# Patient Record
Sex: Female | Born: 1975 | Race: Black or African American | Hispanic: No | Marital: Single | Smoking: Current every day smoker
Health system: Southern US, Community
[De-identification: ages and names within clinical notes are randomized; demographics above are authoritative.]

## PROBLEM LIST (undated history)

## (undated) DIAGNOSIS — G039 Meningitis, unspecified: Secondary | ICD-10-CM

## (undated) DIAGNOSIS — I10 Essential (primary) hypertension: Secondary | ICD-10-CM

## (undated) DIAGNOSIS — M797 Fibromyalgia: Secondary | ICD-10-CM

## (undated) DIAGNOSIS — M1990 Unspecified osteoarthritis, unspecified site: Secondary | ICD-10-CM

## (undated) DIAGNOSIS — F329 Major depressive disorder, single episode, unspecified: Secondary | ICD-10-CM

## (undated) DIAGNOSIS — F32A Depression, unspecified: Secondary | ICD-10-CM

## (undated) HISTORY — PX: HAND SURGERY: SHX662

## (undated) HISTORY — PX: FOOT SURGERY: SHX648

## (undated) HISTORY — PX: FOOT FRACTURE SURGERY: SHX645

---

## 1997-07-08 ENCOUNTER — Emergency Department (HOSPITAL_COMMUNITY): Admission: EM | Admit: 1997-07-08 | Discharge: 1997-07-08 | Payer: Self-pay | Admitting: Emergency Medicine

## 1997-08-17 ENCOUNTER — Other Ambulatory Visit: Admission: RE | Admit: 1997-08-17 | Discharge: 1997-08-17 | Payer: Self-pay | Admitting: Obstetrics and Gynecology

## 1997-08-17 ENCOUNTER — Other Ambulatory Visit: Admission: RE | Admit: 1997-08-17 | Discharge: 1997-08-17 | Payer: Self-pay | Admitting: Gynecology

## 1997-10-29 ENCOUNTER — Ambulatory Visit (HOSPITAL_COMMUNITY): Admission: RE | Admit: 1997-10-29 | Discharge: 1997-10-29 | Payer: Self-pay | Admitting: Gynecology

## 1998-03-21 ENCOUNTER — Encounter: Payer: Self-pay | Admitting: Emergency Medicine

## 1998-03-21 ENCOUNTER — Emergency Department (HOSPITAL_COMMUNITY): Admission: EM | Admit: 1998-03-21 | Discharge: 1998-03-21 | Payer: Self-pay | Admitting: Emergency Medicine

## 1998-03-24 ENCOUNTER — Ambulatory Visit: Admission: RE | Admit: 1998-03-24 | Discharge: 1998-03-24 | Payer: Self-pay | Admitting: Family Medicine

## 1998-03-24 ENCOUNTER — Encounter: Payer: Self-pay | Admitting: Internal Medicine

## 1998-12-26 ENCOUNTER — Inpatient Hospital Stay (HOSPITAL_COMMUNITY): Admission: EM | Admit: 1998-12-26 | Discharge: 1998-12-26 | Payer: Self-pay | Admitting: Obstetrics & Gynecology

## 1999-07-28 ENCOUNTER — Inpatient Hospital Stay (HOSPITAL_COMMUNITY): Admission: AD | Admit: 1999-07-28 | Discharge: 1999-07-28 | Payer: Self-pay | Admitting: *Deleted

## 1999-07-31 ENCOUNTER — Emergency Department (HOSPITAL_COMMUNITY): Admission: EM | Admit: 1999-07-31 | Discharge: 1999-08-01 | Payer: Self-pay | Admitting: Emergency Medicine

## 1999-08-01 ENCOUNTER — Inpatient Hospital Stay (HOSPITAL_COMMUNITY): Admission: EM | Admit: 1999-08-01 | Discharge: 1999-08-01 | Payer: Self-pay | Admitting: *Deleted

## 1999-08-01 ENCOUNTER — Encounter: Payer: Self-pay | Admitting: Emergency Medicine

## 1999-08-28 ENCOUNTER — Emergency Department (HOSPITAL_COMMUNITY): Admission: EM | Admit: 1999-08-28 | Discharge: 1999-08-28 | Payer: Self-pay | Admitting: *Deleted

## 1999-08-30 ENCOUNTER — Emergency Department (HOSPITAL_COMMUNITY): Admission: EM | Admit: 1999-08-30 | Discharge: 1999-08-30 | Payer: Self-pay | Admitting: Emergency Medicine

## 1999-09-06 ENCOUNTER — Emergency Department (HOSPITAL_COMMUNITY): Admission: EM | Admit: 1999-09-06 | Discharge: 1999-09-06 | Payer: Self-pay | Admitting: Emergency Medicine

## 1999-09-26 ENCOUNTER — Emergency Department (HOSPITAL_COMMUNITY): Admission: EM | Admit: 1999-09-26 | Discharge: 1999-09-26 | Payer: Self-pay | Admitting: *Deleted

## 1999-11-12 ENCOUNTER — Emergency Department (HOSPITAL_COMMUNITY): Admission: EM | Admit: 1999-11-12 | Discharge: 1999-11-12 | Payer: Self-pay | Admitting: Emergency Medicine

## 1999-11-12 ENCOUNTER — Encounter: Payer: Self-pay | Admitting: Emergency Medicine

## 2000-01-03 ENCOUNTER — Inpatient Hospital Stay (HOSPITAL_COMMUNITY): Admission: AD | Admit: 2000-01-03 | Discharge: 2000-01-03 | Payer: Self-pay | Admitting: Obstetrics

## 2000-01-07 ENCOUNTER — Emergency Department (HOSPITAL_COMMUNITY): Admission: EM | Admit: 2000-01-07 | Discharge: 2000-01-07 | Payer: Self-pay | Admitting: Emergency Medicine

## 2000-01-09 ENCOUNTER — Other Ambulatory Visit: Admission: RE | Admit: 2000-01-09 | Discharge: 2000-01-09 | Payer: Self-pay | Admitting: Obstetrics & Gynecology

## 2000-01-09 ENCOUNTER — Encounter: Admission: RE | Admit: 2000-01-09 | Discharge: 2000-01-09 | Payer: Self-pay | Admitting: Obstetrics & Gynecology

## 2000-01-16 ENCOUNTER — Ambulatory Visit (HOSPITAL_COMMUNITY): Admission: RE | Admit: 2000-01-16 | Discharge: 2000-01-16 | Payer: Self-pay | Admitting: Obstetrics & Gynecology

## 2000-06-01 ENCOUNTER — Inpatient Hospital Stay (HOSPITAL_COMMUNITY): Admission: AD | Admit: 2000-06-01 | Discharge: 2000-06-01 | Payer: Self-pay | Admitting: Obstetrics

## 2000-11-17 ENCOUNTER — Inpatient Hospital Stay (HOSPITAL_COMMUNITY): Admission: AD | Admit: 2000-11-17 | Discharge: 2000-11-17 | Payer: Self-pay | Admitting: Obstetrics

## 2001-01-02 ENCOUNTER — Encounter: Admission: RE | Admit: 2001-01-02 | Discharge: 2001-01-02 | Payer: Self-pay | Admitting: Obstetrics

## 2001-01-02 ENCOUNTER — Encounter (INDEPENDENT_AMBULATORY_CARE_PROVIDER_SITE_OTHER): Payer: Self-pay | Admitting: *Deleted

## 2001-04-27 ENCOUNTER — Emergency Department (HOSPITAL_COMMUNITY): Admission: EM | Admit: 2001-04-27 | Discharge: 2001-04-27 | Payer: Self-pay | Admitting: Emergency Medicine

## 2002-12-08 ENCOUNTER — Emergency Department (HOSPITAL_COMMUNITY): Admission: EM | Admit: 2002-12-08 | Discharge: 2002-12-08 | Payer: Self-pay | Admitting: *Deleted

## 2002-12-09 ENCOUNTER — Ambulatory Visit (HOSPITAL_COMMUNITY): Admission: RE | Admit: 2002-12-09 | Discharge: 2002-12-09 | Payer: Self-pay | Admitting: *Deleted

## 2002-12-09 ENCOUNTER — Encounter: Payer: Self-pay | Admitting: *Deleted

## 2003-11-27 ENCOUNTER — Emergency Department (HOSPITAL_COMMUNITY): Admission: EM | Admit: 2003-11-27 | Discharge: 2003-11-27 | Payer: Self-pay | Admitting: Emergency Medicine

## 2003-12-13 ENCOUNTER — Ambulatory Visit: Payer: Self-pay | Admitting: *Deleted

## 2003-12-13 ENCOUNTER — Ambulatory Visit: Payer: Self-pay | Admitting: Family Medicine

## 2004-03-13 ENCOUNTER — Ambulatory Visit: Payer: Self-pay | Admitting: Family Medicine

## 2004-03-14 ENCOUNTER — Ambulatory Visit: Payer: Self-pay | Admitting: Family Medicine

## 2004-05-09 ENCOUNTER — Ambulatory Visit: Payer: Self-pay | Admitting: Family Medicine

## 2004-06-10 ENCOUNTER — Inpatient Hospital Stay (HOSPITAL_COMMUNITY): Admission: AD | Admit: 2004-06-10 | Discharge: 2004-06-10 | Payer: Self-pay | Admitting: *Deleted

## 2004-11-02 ENCOUNTER — Ambulatory Visit: Payer: Self-pay | Admitting: Family Medicine

## 2004-12-01 ENCOUNTER — Ambulatory Visit: Payer: Self-pay | Admitting: Family Medicine

## 2005-03-07 ENCOUNTER — Ambulatory Visit: Payer: Self-pay | Admitting: Family Medicine

## 2005-03-07 ENCOUNTER — Emergency Department (HOSPITAL_COMMUNITY): Admission: EM | Admit: 2005-03-07 | Discharge: 2005-03-07 | Payer: Self-pay | Admitting: Emergency Medicine

## 2005-11-05 ENCOUNTER — Emergency Department (HOSPITAL_COMMUNITY): Admission: EM | Admit: 2005-11-05 | Discharge: 2005-11-05 | Payer: Self-pay | Admitting: Family Medicine

## 2005-11-26 ENCOUNTER — Ambulatory Visit: Payer: Self-pay | Admitting: Family Medicine

## 2006-02-12 ENCOUNTER — Emergency Department (HOSPITAL_COMMUNITY): Admission: EM | Admit: 2006-02-12 | Discharge: 2006-02-12 | Payer: Self-pay | Admitting: Family Medicine

## 2006-07-28 ENCOUNTER — Emergency Department (HOSPITAL_COMMUNITY): Admission: EM | Admit: 2006-07-28 | Discharge: 2006-07-28 | Payer: Self-pay | Admitting: Family Medicine

## 2006-08-07 ENCOUNTER — Ambulatory Visit: Payer: Self-pay | Admitting: Internal Medicine

## 2006-08-08 ENCOUNTER — Ambulatory Visit: Payer: Self-pay | Admitting: Internal Medicine

## 2006-10-03 ENCOUNTER — Ambulatory Visit: Payer: Self-pay | Admitting: Internal Medicine

## 2006-11-20 ENCOUNTER — Encounter (INDEPENDENT_AMBULATORY_CARE_PROVIDER_SITE_OTHER): Payer: Self-pay | Admitting: *Deleted

## 2006-12-23 ENCOUNTER — Ambulatory Visit: Payer: Self-pay | Admitting: Internal Medicine

## 2006-12-23 ENCOUNTER — Encounter (INDEPENDENT_AMBULATORY_CARE_PROVIDER_SITE_OTHER): Payer: Self-pay | Admitting: Family Medicine

## 2006-12-23 LAB — CONVERTED CEMR LAB
ALT: 27 units/L (ref 0–35)
AST: 26 units/L (ref 0–37)
Albumin: 4.2 g/dL (ref 3.5–5.2)
Alkaline Phosphatase: 62 units/L (ref 39–117)
BUN: 16 mg/dL (ref 6–23)
CO2: 25 meq/L (ref 19–32)
Calcium: 9.2 mg/dL (ref 8.4–10.5)
Chloride: 104 meq/L (ref 96–112)
Cholesterol: 167 mg/dL (ref 0–200)
Creatinine, Ser: 0.9 mg/dL (ref 0.40–1.20)
Glucose, Bld: 73 mg/dL (ref 70–99)
HDL: 41 mg/dL (ref >39)
LDL Cholesterol: 96 mg/dL (ref 0–99)
Potassium: 4.3 meq/L (ref 3.5–5.3)
Sodium: 140 meq/L (ref 135–145)
Total Bilirubin: 0.5 mg/dL (ref 0.3–1.2)
Total CHOL/HDL Ratio: 4.1
Total Protein: 7.7 g/dL (ref 6.0–8.3)
Triglycerides: 149 mg/dL (ref <150)
VLDL: 30 mg/dL (ref 0–40)
Valproic Acid Lvl: 45.1 ug/mL — ABNORMAL LOW (ref 50.0–100.0)

## 2007-01-07 ENCOUNTER — Ambulatory Visit: Payer: Self-pay | Admitting: Internal Medicine

## 2007-01-07 ENCOUNTER — Encounter (INDEPENDENT_AMBULATORY_CARE_PROVIDER_SITE_OTHER): Payer: Self-pay | Admitting: Family Medicine

## 2007-01-19 ENCOUNTER — Emergency Department (HOSPITAL_COMMUNITY): Admission: EM | Admit: 2007-01-19 | Discharge: 2007-01-19 | Payer: Self-pay | Admitting: Emergency Medicine

## 2007-06-01 ENCOUNTER — Emergency Department (HOSPITAL_COMMUNITY): Admission: EM | Admit: 2007-06-01 | Discharge: 2007-06-01 | Payer: Self-pay | Admitting: *Deleted

## 2007-07-08 ENCOUNTER — Ambulatory Visit: Payer: Self-pay | Admitting: Internal Medicine

## 2007-08-12 ENCOUNTER — Ambulatory Visit: Payer: Self-pay | Admitting: Family Medicine

## 2007-08-12 ENCOUNTER — Encounter (INDEPENDENT_AMBULATORY_CARE_PROVIDER_SITE_OTHER): Payer: Self-pay | Admitting: Family Medicine

## 2007-08-12 LAB — CONVERTED CEMR LAB
Basophils Absolute: 0 10*3/uL (ref 0.0–0.1)
CO2: 21 meq/L (ref 19–32)
Chlamydia, DNA Probe: NEGATIVE
Chloride: 109 meq/L (ref 96–112)
Creatinine, Ser: 0.84 mg/dL (ref 0.40–1.20)
Free T4: 0.96 ng/dL (ref 0.89–1.80)
GC Probe Amp, Genital: NEGATIVE
Glucose, Bld: 83 mg/dL (ref 70–99)
HCT: 41 % (ref 36.0–46.0)
Hemoglobin: 12.9 g/dL (ref 12.0–15.0)
Lymphocytes Relative: 24 % (ref 12–46)
Lymphs Abs: 1.6 10*3/uL (ref 0.7–4.0)
Monocytes Absolute: 0.5 10*3/uL (ref 0.1–1.0)
Neutro Abs: 4.3 10*3/uL (ref 1.7–7.7)
RDW: 15.3 % (ref 11.5–15.5)
Sodium: 139 meq/L (ref 135–145)
TSH: 0.729 microintl units/mL (ref 0.350–5.50)
WBC: 6.6 10*3/uL (ref 4.0–10.5)

## 2008-05-20 ENCOUNTER — Emergency Department (HOSPITAL_COMMUNITY): Admission: EM | Admit: 2008-05-20 | Discharge: 2008-05-20 | Payer: Self-pay | Admitting: Family Medicine

## 2008-08-22 ENCOUNTER — Emergency Department (HOSPITAL_COMMUNITY): Admission: EM | Admit: 2008-08-22 | Discharge: 2008-08-22 | Payer: Self-pay | Admitting: Emergency Medicine

## 2008-11-23 ENCOUNTER — Emergency Department (HOSPITAL_COMMUNITY): Admission: EM | Admit: 2008-11-23 | Discharge: 2008-11-23 | Payer: Self-pay | Admitting: Emergency Medicine

## 2008-12-20 ENCOUNTER — Emergency Department (HOSPITAL_COMMUNITY): Admission: EM | Admit: 2008-12-20 | Discharge: 2008-12-20 | Payer: Self-pay | Admitting: Emergency Medicine

## 2009-03-22 ENCOUNTER — Ambulatory Visit: Payer: Self-pay | Admitting: Internal Medicine

## 2009-04-06 ENCOUNTER — Encounter: Admission: RE | Admit: 2009-04-06 | Discharge: 2009-04-06 | Payer: Self-pay | Admitting: General Practice

## 2009-05-13 ENCOUNTER — Emergency Department (HOSPITAL_COMMUNITY): Admission: EM | Admit: 2009-05-13 | Discharge: 2009-05-13 | Payer: Self-pay | Admitting: Emergency Medicine

## 2009-08-26 ENCOUNTER — Emergency Department (HOSPITAL_COMMUNITY): Admission: EM | Admit: 2009-08-26 | Discharge: 2009-08-26 | Payer: Self-pay | Admitting: Family Medicine

## 2009-08-29 ENCOUNTER — Emergency Department (HOSPITAL_COMMUNITY): Admission: EM | Admit: 2009-08-29 | Discharge: 2009-08-29 | Payer: Self-pay | Admitting: Emergency Medicine

## 2009-11-18 ENCOUNTER — Ambulatory Visit (HOSPITAL_COMMUNITY): Payer: Self-pay | Admitting: Psychiatry

## 2009-11-25 ENCOUNTER — Emergency Department (HOSPITAL_COMMUNITY): Admission: EM | Admit: 2009-11-25 | Discharge: 2009-11-25 | Payer: Self-pay | Admitting: Emergency Medicine

## 2010-01-10 ENCOUNTER — Ambulatory Visit (HOSPITAL_COMMUNITY): Payer: Self-pay | Admitting: Psychiatry

## 2010-01-17 ENCOUNTER — Ambulatory Visit (HOSPITAL_COMMUNITY): Payer: Self-pay | Admitting: Marriage and Family Therapist

## 2010-02-03 ENCOUNTER — Ambulatory Visit (HOSPITAL_COMMUNITY): Payer: Self-pay | Admitting: Psychiatry

## 2010-02-17 ENCOUNTER — Ambulatory Visit (HOSPITAL_COMMUNITY): Payer: Self-pay | Admitting: Psychiatry

## 2010-02-17 ENCOUNTER — Ambulatory Visit (HOSPITAL_COMMUNITY): Payer: Self-pay | Admitting: Psychology

## 2010-03-03 ENCOUNTER — Ambulatory Visit (HOSPITAL_COMMUNITY): Payer: Self-pay | Admitting: Psychology

## 2010-03-07 ENCOUNTER — Ambulatory Visit (HOSPITAL_COMMUNITY)
Admission: RE | Admit: 2010-03-07 | Discharge: 2010-03-07 | Payer: Self-pay | Source: Home / Self Care | Attending: Psychiatry | Admitting: Psychiatry

## 2010-03-10 ENCOUNTER — Ambulatory Visit (HOSPITAL_COMMUNITY)
Admission: RE | Admit: 2010-03-10 | Discharge: 2010-03-10 | Payer: Self-pay | Source: Home / Self Care | Attending: Psychiatry | Admitting: Psychiatry

## 2010-03-10 ENCOUNTER — Ambulatory Visit (HOSPITAL_COMMUNITY): Admit: 2010-03-10 | Payer: Self-pay | Admitting: Psychiatry

## 2010-03-31 ENCOUNTER — Ambulatory Visit (HOSPITAL_COMMUNITY): Admit: 2010-03-31 | Payer: Self-pay | Admitting: Psychiatry

## 2010-04-11 ENCOUNTER — Encounter (HOSPITAL_COMMUNITY): Payer: 59 | Admitting: Physician Assistant

## 2010-04-11 DIAGNOSIS — F39 Unspecified mood [affective] disorder: Secondary | ICD-10-CM

## 2010-04-14 ENCOUNTER — Encounter (HOSPITAL_COMMUNITY): Payer: Self-pay | Admitting: Psychology

## 2010-05-12 ENCOUNTER — Encounter (HOSPITAL_COMMUNITY): Payer: 59 | Admitting: Physician Assistant

## 2010-05-18 LAB — URINALYSIS, ROUTINE W REFLEX MICROSCOPIC
Bilirubin Urine: NEGATIVE
Hgb urine dipstick: NEGATIVE
Ketones, ur: 15 mg/dL — AB
Protein, ur: NEGATIVE mg/dL
Specific Gravity, Urine: 1.025 (ref 1.005–1.030)
pH: 5.5 (ref 5.0–8.0)

## 2010-05-18 LAB — URINE MICROSCOPIC-ADD ON

## 2010-05-21 LAB — CULTURE, ROUTINE-ABSCESS: Gram Stain: NONE SEEN

## 2010-06-08 LAB — POCT I-STAT, CHEM 8
Calcium, Ion: 1.2 mmol/L (ref 1.12–1.32)
Creatinine, Ser: 0.9 mg/dL (ref 0.4–1.2)
Glucose, Bld: 109 mg/dL — ABNORMAL HIGH (ref 70–99)
HCT: 48 % — ABNORMAL HIGH (ref 36.0–46.0)
Hemoglobin: 16.3 g/dL — ABNORMAL HIGH (ref 12.0–15.0)

## 2010-06-12 LAB — POCT RAPID STREP A (OFFICE): Streptococcus, Group A Screen (Direct): NEGATIVE

## 2010-07-06 ENCOUNTER — Inpatient Hospital Stay (INDEPENDENT_AMBULATORY_CARE_PROVIDER_SITE_OTHER)
Admission: RE | Admit: 2010-07-06 | Discharge: 2010-07-06 | Disposition: A | Discharge status: Home / Self Care | Payer: 59 | Source: Ambulatory Visit | Attending: Family Medicine | Admitting: Family Medicine

## 2010-07-06 ENCOUNTER — Ambulatory Visit (INDEPENDENT_AMBULATORY_CARE_PROVIDER_SITE_OTHER): Payer: 59

## 2010-07-06 DIAGNOSIS — M79609 Pain in unspecified limb: Secondary | ICD-10-CM

## 2010-09-13 ENCOUNTER — Inpatient Hospital Stay (INDEPENDENT_AMBULATORY_CARE_PROVIDER_SITE_OTHER)
Admission: RE | Admit: 2010-09-13 | Discharge: 2010-09-13 | Disposition: A | Discharge status: Home / Self Care | Payer: 59 | Source: Ambulatory Visit | Attending: Family Medicine | Admitting: Family Medicine

## 2010-09-13 DIAGNOSIS — J019 Acute sinusitis, unspecified: Secondary | ICD-10-CM

## 2010-09-13 LAB — POCT RAPID STREP A: Streptococcus, Group A Screen (Direct): NEGATIVE

## 2010-09-13 LAB — POCT I-STAT, CHEM 8
HCT: 46 % (ref 36.0–46.0)
Hemoglobin: 15.6 g/dL — ABNORMAL HIGH (ref 12.0–15.0)
Sodium: 139 mEq/L (ref 135–145)
TCO2: 26 mmol/L (ref 0–100)

## 2011-01-24 ENCOUNTER — Encounter: Payer: Self-pay | Admitting: *Deleted

## 2011-01-24 ENCOUNTER — Emergency Department (INDEPENDENT_AMBULATORY_CARE_PROVIDER_SITE_OTHER)
Admission: EM | Admit: 2011-01-24 | Discharge: 2011-01-24 | Disposition: A | Discharge status: Home / Self Care | Payer: 59 | Source: Home / Self Care | Attending: Emergency Medicine | Admitting: Emergency Medicine

## 2011-01-24 DIAGNOSIS — R197 Diarrhea, unspecified: Secondary | ICD-10-CM

## 2011-01-24 DIAGNOSIS — Z331 Pregnant state, incidental: Secondary | ICD-10-CM

## 2011-01-24 DIAGNOSIS — J069 Acute upper respiratory infection, unspecified: Secondary | ICD-10-CM

## 2011-01-24 HISTORY — DX: Essential (primary) hypertension: I10

## 2011-01-24 LAB — POCT URINALYSIS DIP (DEVICE)
Bilirubin Urine: NEGATIVE
Glucose, UA: NEGATIVE mg/dL
Leukocytes, UA: NEGATIVE
Nitrite: NEGATIVE
pH: 6 (ref 5.0–8.0)

## 2011-01-24 MED ORDER — ONDANSETRON 4 MG PO TBDP
4.0000 mg | ORAL_TABLET | Freq: Once | ORAL | Status: AC
  Administered 2011-01-24: 4 mg via ORAL

## 2011-01-24 MED ORDER — ONDANSETRON 4 MG PO TBDP
ORAL_TABLET | ORAL | Status: AC
  Filled 2011-01-24: qty 1

## 2011-01-24 MED ORDER — ONDANSETRON HCL 4 MG PO TABS: 4.0000 mg | ORAL_TABLET | Freq: Three times a day (TID) | ORAL | Status: AC | PRN

## 2011-01-24 MED ORDER — PRENATAL COMPLETE 14-0.4 MG PO TABS: ORAL_TABLET | ORAL | Status: DC

## 2011-01-24 MED ORDER — DIPHENOXYLATE-ATROPINE 2.5-0.025 MG PO TABS: 1.0000 | ORAL_TABLET | Freq: Four times a day (QID) | ORAL | Status: DC | PRN

## 2011-01-24 NOTE — ED Provider Notes (Addendum)
History     CSN: [id number] Arrival date & time: 01/24/2011  8:07 PM   First MD Initiated Contact with Patient 01/24/11 1928      Chief Complaint  Patient presents with  . Nausea    (Consider location/radiation/quality/duration/timing/severity/associated sxs/prior treatment) HPI Comments: For 2 days been congested and coughing with some diarrheas, been nauseous as well No fevers, have felt some chills and have had some phlegm when i cough".  The history is provided by the patient.    Past Medical History  Diagnosis Date  . Diabetes mellitus   . Hypertension     Past Surgical History  Procedure Date  . Hand surgery   . Foot fracture surgery     Family History  Problem Relation Age of Onset  . Hypertension Father   . Diabetes Father     History  Substance Use Topics  . Smoking status: Current Everyday Smoker  . Smokeless tobacco: Not on file  . Alcohol Use: No    OB History    Grav Para Term Preterm Abortions TAB SAB Ect Mult Living                  Review of Systems  Constitutional: Negative for fever and fatigue.  HENT: Positive for sore throat.   Respiratory: Positive for cough.   Gastrointestinal: Positive for nausea and diarrhea.  Genitourinary: Negative for dyspareunia.       Amenorrhea    Allergies  Review of patient's allergies indicates no known allergies.  Home Medications   Current Outpatient Rx  Name Route Sig Dispense Refill  . BISMUTH SUBSALICYLATE 262 MG/15ML PO SUSP Oral Take 15 mLs by mouth every 6 (six) hours as needed.      Marland Kitchen HYDROCHLOROTHIAZIDE 12.5 MG PO TABS Oral Take 12.5 mg by mouth daily.        BP 130/90  Pulse 82  Temp(Src) 98.6 F (37 C) (Oral)  Resp 18  SpO2 100%  LMP 12/15/2010  Physical Exam  Nursing note and vitals reviewed. Constitutional: She appears well-developed and well-nourished. No distress.  HENT:  Head: Normocephalic.  Mouth/Throat: Uvula is midline and oropharynx is clear and moist. No  oropharyngeal exudate or posterior oropharyngeal erythema.  Eyes: No scleral icterus.  Neck: Normal range of motion.  Abdominal: Soft. She exhibits no mass. There is no tenderness. There is no rebound and no guarding.  Neurological: She is alert.    ED Course  Procedures (including critical care time)   Labs Reviewed  POCT URINALYSIS DIPSTICK  POCT PREGNANCY, URINE   No results found.   No diagnosis found.    MDM  Diarrhea x 48 hrs hydrated tolerating fluids at [facility]- nausea- and early pregnancy based on LMP       Jimmie Molly, MD 01/24/11 2111  Jimmie Molly, MD 01/24/11 2117

## 2011-01-24 NOTE — ED Notes (Signed)
Pt   Has  Symptoms  Of    Cough  /  Congested     And  Nausea        As well       Has  Been taking  mucionex      As  Well

## 2011-01-24 NOTE — ED Notes (Signed)
Nausea      Diarrhea    sorethroat      Coughing  Up  Brown phlegm         Symptoms  X  2  Days         Feels  Weak  And  Having  Chills  As  Well

## 2011-02-08 LAB — OB RESULTS CONSOLE ABO/RH

## 2011-02-08 LAB — OB RESULTS CONSOLE ANTIBODY SCREEN: Antibody Screen: NEGATIVE

## 2011-02-08 LAB — OB RESULTS CONSOLE HEPATITIS B SURFACE ANTIGEN: Hepatitis B Surface Ag: NEGATIVE

## 2011-02-08 LAB — OB RESULTS CONSOLE GC/CHLAMYDIA: Gonorrhea: NEGATIVE

## 2011-02-20 ENCOUNTER — Emergency Department (INDEPENDENT_AMBULATORY_CARE_PROVIDER_SITE_OTHER)
Admission: EM | Admit: 2011-02-20 | Discharge: 2011-02-20 | Disposition: A | Discharge status: Home / Self Care | Payer: 59 | Source: Home / Self Care | Attending: Emergency Medicine | Admitting: Emergency Medicine

## 2011-02-20 ENCOUNTER — Encounter (HOSPITAL_COMMUNITY): Payer: Self-pay | Admitting: *Deleted

## 2011-02-20 DIAGNOSIS — B9789 Other viral agents as the cause of diseases classified elsewhere: Secondary | ICD-10-CM

## 2011-02-20 MED ORDER — ONDANSETRON HCL 4 MG PO TABS: 4.0000 mg | ORAL_TABLET | Freq: Four times a day (QID) | ORAL | Status: AC

## 2011-02-20 MED ORDER — ALBUTEROL SULFATE HFA 108 (90 BASE) MCG/ACT IN AERS: 2.0000 | INHALATION_SPRAY | RESPIRATORY_TRACT | Status: DC | PRN

## 2011-02-20 NOTE — ED Notes (Signed)
Pt     Reports     Cough  Congested  Runny  Nose     sorethroat /  Body  Aches   Symptoms    X  5  Days  unreleived  By  otc  meds  Pt  Is  4  Months  Pregnant   As  Well

## 2011-02-20 NOTE — ED Provider Notes (Signed)
History     CSN: [id number] Arrival date & time: 02/20/2011  3:18 PM   First MD Initiated Contact with Patient 02/20/11 1513      Chief Complaint  Patient presents with  . Cough    (Consider location/radiation/quality/duration/timing/severity/associated sxs/prior treatment) HPI Comments: Patient who is [redacted] weeks pregnant presents with 3 days of cough, sore throat, nasal congestion, headache, ear pain, N/V, fever to 103 at home.  Fever has been relieved with tylenol.  Patient called her obgyn who requested she come to urgent care for further evaluation.  Pt denies SOB, abdominal pain, abnormal vaginal discharge or bleeding.    Patient is a 35 y.o. female presenting with cough. The history is provided by the patient.  Cough    Past Medical History  Diagnosis Date  . Diabetes mellitus   . Hypertension     Past Surgical History  Procedure Date  . Hand surgery   . Foot fracture surgery     Family History  Problem Relation Age of Onset  . Hypertension Father   . Diabetes Father     History  Substance Use Topics  . Smoking status: Current Everyday Smoker  . Smokeless tobacco: Not on file  . Alcohol Use: No    OB History    Grav Para Term Preterm Abortions TAB SAB Ect Mult Living   1               Review of Systems  Respiratory: Positive for cough.   All other systems reviewed and are negative.    Allergies  Review of patient's allergies indicates no known allergies.  Home Medications   Current Outpatient Rx  Name Route Sig Dispense Refill  . ACETAMINOPHEN 325 MG PO TABS Oral Take 650 mg by mouth every 6 (six) hours as needed.      . GUAIFENESIN ER 600 MG PO TB12 Oral Take 1,200 mg by mouth 2 (two) times daily.      Marland Kitchen BISMUTH SUBSALICYLATE 262 MG/15ML PO SUSP Oral Take 15 mLs by mouth every 6 (six) hours as needed.      Marland Kitchen HYDROCHLOROTHIAZIDE 12.5 MG PO TABS Oral Take 12.5 mg by mouth daily.      Marland Kitchen PRENATAL COMPLETE 14-0.4 MG PO TABS  1 tab po daily 60 each  0    BP 128/83  Pulse 88  Temp(Src) 98.5 F (36.9 C) (Oral)  Resp 16  SpO2 100%  LMP 12/15/2010  Physical Exam  Nursing note and vitals reviewed. Constitutional: She is oriented to person, place, and time. She appears well-developed and well-nourished.  HENT:  Head: Normocephalic and atraumatic.  Right Ear: No tenderness. A foreign body is present.  Left Ear: Tympanic membrane normal. No tenderness.  Mouth/Throat: Uvula is midline and mucous membranes are normal. No uvula swelling. Posterior oropharyngeal erythema present. No oropharyngeal exudate, posterior oropharyngeal edema or tonsillar abscesses.  Neck: Trachea normal, normal range of motion and phonation normal. Neck supple. No tracheal tenderness present. No rigidity. No tracheal deviation and normal range of motion present.  Cardiovascular: Normal rate and regular rhythm.   Pulmonary/Chest: Effort normal and breath sounds normal. No stridor. She has no decreased breath sounds. She has no wheezes. She has no rhonchi. She has no rales.  Abdominal: Soft. There is no tenderness. There is no rebound.  Neurological: She is alert and oriented to person, place, and time.    ED Course  Procedures (including critical care time)  Made initial attempt to remove FB from  right ear with forceps.  Patient declines further attempts, does not want object removed.  Foreign body has appearance of cotton plug.    Labs Reviewed - No data to display No results found.   1. Viral respiratory illness       MDM  Patient [redacted] weeks pregnant with viral illness, at least 3 full days of symptoms.  Given late presentation, Tamiflu unlikely to provide benefit.  Patient to continue symptomatic treatment, drink plenty of fluids, return for worsening symptoms.          Dillard Cannon [place], [place] 02/20/11 [phone]

## 2011-02-22 ENCOUNTER — Telehealth (HOSPITAL_COMMUNITY): Payer: Self-pay | Admitting: *Deleted

## 2011-02-23 NOTE — ED Provider Notes (Signed)
Medical screening examination/treatment/procedure(s) were performed by non-physician practitioner and as supervising physician I was immediately available for consultation/collaboration.  Luiz Blare MD   Luiz Blare, MD 02/23/11 [phone]

## 2011-03-08 ENCOUNTER — Other Ambulatory Visit (HOSPITAL_COMMUNITY): Payer: Self-pay | Admitting: Obstetrics and Gynecology

## 2011-03-08 DIAGNOSIS — Z369 Encounter for antenatal screening, unspecified: Secondary | ICD-10-CM

## 2011-03-13 ENCOUNTER — Ambulatory Visit (HOSPITAL_COMMUNITY): Admission: RE | Admit: 2011-03-13 | Payer: 59 | Source: Ambulatory Visit

## 2011-03-13 ENCOUNTER — Ambulatory Visit (HOSPITAL_COMMUNITY)
Admission: RE | Admit: 2011-03-13 | Discharge: 2011-03-13 | Disposition: A | Discharge status: Home / Self Care | Payer: 59 | Source: Ambulatory Visit | Attending: Obstetrics and Gynecology | Admitting: Obstetrics and Gynecology

## 2011-03-13 ENCOUNTER — Encounter (HOSPITAL_COMMUNITY): Payer: Self-pay

## 2011-03-13 ENCOUNTER — Other Ambulatory Visit (HOSPITAL_COMMUNITY): Payer: Self-pay | Admitting: Obstetrics and Gynecology

## 2011-03-13 DIAGNOSIS — O24919 Unspecified diabetes mellitus in pregnancy, unspecified trimester: Secondary | ICD-10-CM | POA: Insufficient documentation

## 2011-03-13 DIAGNOSIS — O3410 Maternal care for benign tumor of corpus uteri, unspecified trimester: Secondary | ICD-10-CM | POA: Insufficient documentation

## 2011-03-13 DIAGNOSIS — O99330 Smoking (tobacco) complicating pregnancy, unspecified trimester: Secondary | ICD-10-CM | POA: Insufficient documentation

## 2011-03-13 DIAGNOSIS — O10019 Pre-existing essential hypertension complicating pregnancy, unspecified trimester: Secondary | ICD-10-CM | POA: Insufficient documentation

## 2011-03-13 DIAGNOSIS — Z369 Encounter for antenatal screening, unspecified: Secondary | ICD-10-CM

## 2011-03-13 DIAGNOSIS — O09519 Supervision of elderly primigravida, unspecified trimester: Secondary | ICD-10-CM | POA: Insufficient documentation

## 2011-03-13 HISTORY — DX: Fibromyalgia: M79.7

## 2011-03-14 ENCOUNTER — Other Ambulatory Visit: Payer: Self-pay

## 2011-05-01 ENCOUNTER — Ambulatory Visit (INDEPENDENT_AMBULATORY_CARE_PROVIDER_SITE_OTHER): Payer: 59 | Admitting: Psychology

## 2011-05-01 DIAGNOSIS — F341 Dysthymic disorder: Secondary | ICD-10-CM

## 2011-05-01 DIAGNOSIS — F3163 Bipolar disorder, current episode mixed, severe, without psychotic features: Secondary | ICD-10-CM

## 2011-05-01 DIAGNOSIS — F329 Major depressive disorder, single episode, unspecified: Secondary | ICD-10-CM | POA: Insufficient documentation

## 2011-05-01 DIAGNOSIS — F419 Anxiety disorder, unspecified: Secondary | ICD-10-CM

## 2011-05-01 NOTE — Progress Notes (Signed)
THERAPIST PROGRESS NOTE  Session Time: 1500 - 1545   Participation Level: Active  Behavioral Response: DisheveledAlertAnxious, Depressed and Irritable  Type of Therapy: Individual Therapy  Treatment Goals addressed: Anger, Anxiety and Coping  Interventions: CBT, Supportive and Reframing  Summary: Virginia Simmons is a 36 y.o. female who presents with complaint that her life is falling apart, "because I am pregnant."  Not seen by me for 1 year.  States she was doing okay, working and taking medication until about 5-6 months ago.  She ran out of refills and thought she did not need it anymore.  She stopped smoking weed and drinking alcohol when she figured out that those substances messed her up.  Then, around Thanksgiving, 2012 she found out she was pregnant.  She had had a sexual relationship with a 26 year younger man for the past 7 years, "but I really didn't like him".  It upset her to be pregnant, "because I'm not a loving kind of person", but he encouraged her and said he wanted to help out.  Her mother also offered to take care of the baby for her.  In the past month, she has been more fatigued and less able to do the heavy lifting at her job.  Co-workers were helping her, but her boss wrote her up and has now cut back her hours to 20/week.  She has been labile at work at times, has always been known to speak her mind, and has been in direct conflict with this boss.  She is afraid she will lose her job, and yet finds she is having a lot of back pain when working, so knows she does not need to be doing full time.  "But I am going to lose everything I have worked so hard to get."  Affect is labile, mood depressed, presentation dramatic and yet coherent, logical and goal directed. I do not see signs of psychosis today.  She did calm herself some and we talked about practical matters:  She has already been approved for Medicaid for pregnancy.  Her mother has room, health, and ability to take  care of the baby if she is unable to.  She is frightened that her mental illness will make her a poor mother, but is also looking forward to holding the baby and seeing how she feels then.  She doesn't trust the father to really help out, as he has a number of other children with other women and doesn't work much.  But he has already given her a car seat and stroller.   Suicidal/Homicidal: Nowithout intent/plan  Thinks about "Little devil sitting on my shoulder tells me to kill myself, but when I think about it I picture myself holding my little baby and I know I don't want to die."  Therapist Response: Allowed her to vent and then went over and clarified the details.  Looked at the support she has.  Acknowledged that she gave up pot and alcohol prior to the pregnancy and was not taking medications at that time either.  She likes her OB Dr. (he delivered her and has known her all her life).   Talked about the options of having her mother as custodian of the baby if that becomes necessary while pt. Could still be involved.  She wants to see psychiatrist, so we made her an appointment and also signed a release for the psychiatrist to communicate with her OB Dr., Dr. Tracey Harries. She calmed considerably, gave herself  a pep talk about accepting what she had to and dealing with things as they come up, not in advance through excessive worry.  Plan: Return again in 2 weeks.  Diagnosis: Axis I: Bipolar, mixed,    Axis II: Deferred    Virginia Mcmanamon, RN 05/01/2011

## 2011-05-22 ENCOUNTER — Ambulatory Visit (INDEPENDENT_AMBULATORY_CARE_PROVIDER_SITE_OTHER): Payer: 59 | Admitting: Psychology

## 2011-05-22 DIAGNOSIS — F3177 Bipolar disorder, in partial remission, most recent episode mixed: Secondary | ICD-10-CM

## 2011-05-22 NOTE — Progress Notes (Signed)
   THERAPIST PROGRESS NOTE  Session Time: 1400 - 1455  Participation Level: Active  Behavioral Response: Casual and Fairly GroomedAlertAnxious  Type of Therapy: Individual Therapy  Treatment Goals addressed: Coping  Interventions: Supportive and Reframing  Summary: Trinaty A Leipold is a 36 y.o. female who presents with feeling quite emotional about her pregnancy and ambivalent about the father of the baby.  She repeats often that she will love her baby and will make a good life for her (now has learned the sex).  EDD given as 09/17/11, but there is the possibility of change as she had several episodes of abnormal bleeding for the first couple of months.  Main theme today was that she could do what she needs to do without the father, and that she pushes him away, acts like she doesn't care, because she knows he has not stayed around for any of his other children by several different women. Underneath that, "I love him" even though he is ten years younger.  She was reminded that her father used to call her names and say she was ugly when she was growing up, but now that he is in a nursing home and has had a stroke, he calls her beautiful and uses pet names for her.  Corrie says that she is not sleeping well, fantasizing about the baby when she goes to bed.  She is happier this week because she has been asked to work extra hours after being dropped back to only 20 hours/week on the last schedule.  However, she is finding it hard to work a 12 hour shift because she has to stand most of the day and lift heavy kitchen pots and supplies.  Whereas the supervisor had said she wasn't doing her work well a month ago, now she is thanking her for coming to work.  Suicidal/Homicidal: No, without intent/plan  Therapist Response: We talked about the ambivalence, the pros and cons of the relationship with the baby's father.  She does have a good relationship with her mother and other relatives and friends who seem  to be excited about the baby and eager to help as well.  I encouraged her in her desire to buy some things for the baby herself, because providing for her is so important to her.  She has an appointment to review need for medication coming up later this week with a psychiatrist, but so far, she seems to be handling the pregnancy well for someone who never expected or planned to have children.  Plan: Return again in 3 weeks.  Diagnosis: Axis I: Bipolar, mixed    Axis II: No diagnosis    Domnick Chervenak, RN 05/22/2011

## 2011-05-25 ENCOUNTER — Ambulatory Visit (INDEPENDENT_AMBULATORY_CARE_PROVIDER_SITE_OTHER): Payer: No Typology Code available for payment source | Admitting: Psychiatry

## 2011-05-25 ENCOUNTER — Encounter (HOSPITAL_COMMUNITY): Payer: Self-pay | Admitting: Psychiatry

## 2011-05-25 DIAGNOSIS — F319 Bipolar disorder, unspecified: Secondary | ICD-10-CM

## 2011-05-25 MED ORDER — HALOPERIDOL 2 MG PO TABS: 2.0000 mg | ORAL_TABLET | Freq: Every day | ORAL | Status: DC

## 2011-05-25 MED ORDER — HYDROXYZINE PAMOATE 25 MG PO CAPS: 25.0000 mg | ORAL_CAPSULE | ORAL | Status: DC | PRN
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
## 2011-05-25 NOTE — Patient Instructions (Signed)
Please call your OB/GYN before you start taking Haldol and Vistaril. Followup in one week. Call us if his symptoms get worse or if he have any concerns, side effects or issues with the medication.

## 2011-06-04 ENCOUNTER — Encounter (HOSPITAL_COMMUNITY): Payer: Self-pay | Admitting: Psychiatry

## 2011-06-04 ENCOUNTER — Ambulatory Visit (INDEPENDENT_AMBULATORY_CARE_PROVIDER_SITE_OTHER): Payer: 59 | Admitting: Psychiatry

## 2011-06-04 DIAGNOSIS — F319 Bipolar disorder, unspecified: Secondary | ICD-10-CM

## 2011-06-04 MED ORDER — BENZTROPINE MESYLATE 1 MG PO TABS: 1.0000 mg | ORAL_TABLET | Freq: Every day | ORAL | Status: DC

## 2011-06-04 MED ORDER — HALOPERIDOL 5 MG PO TABS: 5.0000 mg | ORAL_TABLET | Freq: Every day | ORAL | Status: DC
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
## 2011-06-04 NOTE — Patient Instructions (Signed)
Please check with your OBGYN before you start these medications.

## 2011-06-20 ENCOUNTER — Encounter (HOSPITAL_COMMUNITY): Payer: Self-pay | Admitting: Psychiatry

## 2011-06-20 ENCOUNTER — Ambulatory Visit (INDEPENDENT_AMBULATORY_CARE_PROVIDER_SITE_OTHER): Payer: 59 | Admitting: Psychiatry

## 2011-06-20 VITALS — BP 102/65 | HR 111 | Wt 211.0 lb

## 2011-06-20 DIAGNOSIS — F319 Bipolar disorder, unspecified: Secondary | ICD-10-CM

## 2011-06-20 MED ORDER — QUETIAPINE FUMARATE 50 MG PO TABS: 50.0000 mg | ORAL_TABLET | Freq: Every day | ORAL | Status: DC
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
## 2011-06-21 ENCOUNTER — Ambulatory Visit (INDEPENDENT_AMBULATORY_CARE_PROVIDER_SITE_OTHER): Payer: 59 | Admitting: Psychology

## 2011-06-21 DIAGNOSIS — F3162 Bipolar disorder, current episode mixed, moderate: Secondary | ICD-10-CM

## 2011-06-21 NOTE — Progress Notes (Signed)
   THERAPIST PROGRESS NOTE  Session Time: 1610 - 0930  Participation Level: Active  Behavioral Response: Neat and Well GroomedAlertDepressed  Type of Therapy: Individual Therapy  Treatment Goals addressed: Coping  Interventions: Psychosocial Skills: job problem and Reframing  Summary: Virginia Simmons is a 36 y.o. female who presents with labile emotions and mood.  Describes self as sad about the lack of support from the father of her baby and says he returned to her house, but she has now told him to leave and not come back.  Was fired from job on the 9th of April with the reason given that she was rude and unpleasant to co-workers.  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  Attributes this to the inability to take her normal medications while she is pregnant, but admits that this sometimes happens even when she is on meds.   She brought short term disbility forms to be signed so that she will have some income.  She believed she is not entitled to unemployment, but I asked her to check this out, because she had worked for the same company for 8 years.  She is thinking logically and shows that she is able to make the contacts she needs to make to get benefits for herself and her coming child. She gives no evidence of psychotic thinking, but she does reacts with anger when she feels like someone is saying something "bad about my baby".   She complains of difficulty sleeping until OB Dr. Catalina Pizza her she could take Tylenol PM.  This is helping. She describes her understanding that she needs to exercise (is walking places) and needs to eat well (has gestational diabetes and has gained 30 lbs.) and states she has determined that she will not try to breastfeed, knowing that would be too frustrating for her.  This demonstrates good judgment and I supported her in this decision, because it will also mean she can resume  medications as soon as the baby is born.   Suicidal/Homicidal: Nowithout intent/plan  Therapist Response: Reinforced her positive intent to care for her child and the good judgments she is making.  Also highlighted the support system she has and we talked about other areas to look for support, including her mother's church.  We complete the disability forms and will request Dr. Lolly Mustache sign them tomorrow.  Plan: Return again in 2-3 weeks.  Diagnosis: Axis I: Bipolar, Depressed    Axis II: No diagnosis    Virginia Mastrogiovanni, RN 06/21/2011

## 2011-07-05 ENCOUNTER — Ambulatory Visit (HOSPITAL_COMMUNITY): Payer: Self-pay | Admitting: Psychology

## 2011-07-06 ENCOUNTER — Ambulatory Visit (INDEPENDENT_AMBULATORY_CARE_PROVIDER_SITE_OTHER): Payer: 59 | Admitting: Psychology

## 2011-07-06 DIAGNOSIS — F3132 Bipolar disorder, current episode depressed, moderate: Secondary | ICD-10-CM
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
## 2011-07-12 ENCOUNTER — Ambulatory Visit (INDEPENDENT_AMBULATORY_CARE_PROVIDER_SITE_OTHER): Payer: 59 | Admitting: Psychology

## 2011-07-12 DIAGNOSIS — F3132 Bipolar disorder, current episode depressed, moderate: Secondary | ICD-10-CM
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
## 2011-07-13 ENCOUNTER — Ambulatory Visit (INDEPENDENT_AMBULATORY_CARE_PROVIDER_SITE_OTHER): Payer: 59 | Admitting: Psychiatry

## 2011-07-13 ENCOUNTER — Encounter (HOSPITAL_COMMUNITY): Payer: Self-pay | Admitting: Psychiatry

## 2011-07-13 DIAGNOSIS — F319 Bipolar disorder, unspecified: Secondary | ICD-10-CM

## 2011-07-13 MED ORDER — HYDROXYZINE PAMOATE 25 MG PO CAPS: 25.0000 mg | ORAL_CAPSULE | Freq: Every day | ORAL | Status: AC | PRN
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
## 2011-07-26 ENCOUNTER — Ambulatory Visit (INDEPENDENT_AMBULATORY_CARE_PROVIDER_SITE_OTHER): Payer: 59 | Admitting: Psychology

## 2011-07-26 ENCOUNTER — Encounter (HOSPITAL_COMMUNITY): Payer: Self-pay

## 2011-07-26 DIAGNOSIS — F3160 Bipolar disorder, current episode mixed, unspecified: Secondary | ICD-10-CM
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
## 2011-08-02 ENCOUNTER — Ambulatory Visit (INDEPENDENT_AMBULATORY_CARE_PROVIDER_SITE_OTHER): Payer: 59 | Admitting: Psychology

## 2011-08-02 DIAGNOSIS — F3130 Bipolar disorder, current episode depressed, mild or moderate severity, unspecified: Secondary | ICD-10-CM

## 2011-08-02 DIAGNOSIS — F319 Bipolar disorder, unspecified: Secondary | ICD-10-CM
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
## 2011-08-03 ENCOUNTER — Ambulatory Visit (HOSPITAL_COMMUNITY): Payer: Self-pay | Admitting: Psychiatry

## 2011-08-08 ENCOUNTER — Encounter (HOSPITAL_COMMUNITY): Payer: Self-pay | Admitting: Psychiatry

## 2011-08-08 ENCOUNTER — Ambulatory Visit (INDEPENDENT_AMBULATORY_CARE_PROVIDER_SITE_OTHER): Payer: 59 | Admitting: Psychiatry

## 2011-08-08 DIAGNOSIS — F200 Paranoid schizophrenia: Secondary | ICD-10-CM

## 2011-08-08 DIAGNOSIS — F319 Bipolar disorder, unspecified: Secondary | ICD-10-CM

## 2011-08-08 MED ORDER — HYDROXYZINE PAMOATE 25 MG PO CAPS: 25.0000 mg | ORAL_CAPSULE | Freq: Three times a day (TID) | ORAL | Status: AC | PRN
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
## 2011-08-14 ENCOUNTER — Ambulatory Visit (HOSPITAL_COMMUNITY): Payer: Self-pay | Admitting: Psychology

## 2011-08-14 NOTE — Progress Notes (Unsigned)
   THERAPIST PROGRESS NOTE  Session Time: ***  Participation Level: {BHH PARTICIPATION LEVEL:22264}  Behavioral Response: {Appearance:22683}{BHH LEVEL OF CONSCIOUSNESS:22305}{BHH MOOD:22306}  Type of Therapy: {CHL AMB BH Type of Therapy:21022741}  Treatment Goals addressed: {CHL AMB BH Treatment Goals Addressed:21022754}  Interventions: {CHL AMB BH Type of Intervention:21022753}  Summary: Virginia Simmons is a 36 y.o. female who presents with ***.   Suicidal/Homicidal: {BHH YES OR NO:22294}{yes/no/with/without intent/plan:22693}  Therapist Response: ***  Plan: Return again in *** weeks.  Diagnosis: Axis I: Bipolar, mixed       Axis II: {psych axis 2:31910}       1.  Problem: Unplanned pregnancy with little emotional and financial support;  She has embraced the idea of having a baby "to love her" and for her to love as well.  She is experiencing gestational diabetes and has financial constraints as well as knowledge deficit for managing this condition.   Goal or Outcome:      Interventions:    Update/Progress:  2.  Problem:  Emotional Instability related to being off her regular psychotropics during pregnancy.  Risk for depression or psychosis post-partum.   Goal or outcome:    Interventions:           Update/Progress:           3.  Problem:  Potential for difficulty with single-parenting of her baby due to her history of mental instability, even though she has been able to work to support herself.   Goal or outcome:    Interventions:           Update/Progress:                   Shonna Chock, RN 08/14/2011

## 2011-08-17 ENCOUNTER — Ambulatory Visit (HOSPITAL_COMMUNITY): Payer: 59 | Admitting: Psychology

## 2011-08-17 DIAGNOSIS — F3161 Bipolar disorder, current episode mixed, mild: Secondary | ICD-10-CM

## 2011-08-17 NOTE — Progress Notes (Signed)
THERAPIST PROGRESS NOTE  Session XBJY:7829 - 0915  Participation Level: Active  Behavioral Response: Casual and Fairly GroomedAlertDysphoric  Type of Therapy: Individual Therapy  Treatment Goals addressed: Coping  Interventions: Supportive and Reframing  Summary: Virginia Simmons is a 36 y.o. female who presents with good news about her pregnancy which is progressing in a healthy way.  She is 2 cm. Dilated, blood pressure and blood sugar are normal, and the baby is well positioned for a vaginal birth.  Her sister is giving her a shower this weekend, but she already has received most of the essentials and has had her house cleaned (even the walls washed) and the nursery is set up.  She continues to have some contact with the baby's father, but he is apparently living in [place] and sent her a picture of himself working at [employer].  "He may be working right now, but how long will that last.  He didn't say anything about my baby, and he doesn't care about her either."  Virginia Simmons has said at times that her mother or sister ask her to go back to her house, saying they need a break from her, so I asked what she does that is so annoying.  "I guess I want to talk all the time and they say I repeat myself.  But I get lonely at home.  When my baby comes, then I will have her for company and will be busy taking care of her."  She also talked about her habit of pulling out her hair, so that she has very thin areas on both sides near the front.  She revealed that what she wears is a sewn-in hairpiece, not a wig. She also talked about her sister calling the baby a bastard, "but that's just the way she talks.  She doesn't mean anything buy it, but it does hurt my feelings."  Virginia Simmons has labile mood and affect, but remained logical and with some good insight into her response to her mother and sister as well as the baby's father.  She reports the last week has been good because she has been focused on getting her  house in order for the arrival.  She has also decided she will not smoke around the baby, but will go outside to smoke. "I would like to stop and I have tried to stop before, but sometimes it is all I have to help me calm down."  No fights or arguments reported this week.  She is focused now solely on meeting her daughter and seems to have no particular anxiety about the birth.  She expressed her thought that she would drive herself to the hospital, have the baby by herself and then call her mother to tell her. "I don't want to be a bother to nobody."  However, her mother has told her she will be with her to hold her hand and I encouraged her to let her mother do that for her.  Suicidal/Homicidal: Nowithout intent/plan  "I think that God knew what he was doing because being pregnant might have saved my life.  I have something to live for."  Therapist Response:  Interventions consist of support and re-framing.  I wanted her to recognize the support she does have, even if it comes with unwelcome words at times.  She explained that in her family, criticizing was they way they learned to express love, so she knew that her sister would love her baby, no matter what she  said now.  We also talked about the future need to get a job and find childcare.  I told her about [facility] as a possible place to get some help with finding a job suitable to her mental health issues and to help with subsidized childcare as well.   Plan: Return again in 2 weeks.  Call us when she delivers.  Will refer to [facility], in the future.  Treatment goals are likely to change after she has the baby and can return to taking some medications that she is not taking now.  Diagnosis: Axis I: Bipolar, mixed    Axis II: No diagnosis    Mollie Rossano, RN 08/17/2011

## 2011-08-22 ENCOUNTER — Encounter (HOSPITAL_COMMUNITY): Payer: Self-pay | Admitting: *Deleted

## 2011-08-22 ENCOUNTER — Inpatient Hospital Stay (HOSPITAL_COMMUNITY)
Admission: AD | Admit: 2011-08-22 | Discharge: 2011-08-22 | Disposition: A | Discharge status: Hospice | Payer: Medicaid Other | Source: Ambulatory Visit | Attending: Obstetrics and Gynecology | Admitting: Obstetrics and Gynecology

## 2011-08-22 DIAGNOSIS — O99891 Other specified diseases and conditions complicating pregnancy: Secondary | ICD-10-CM | POA: Insufficient documentation

## 2011-08-22 DIAGNOSIS — R109 Unspecified abdominal pain: Secondary | ICD-10-CM | POA: Insufficient documentation

## 2011-08-22 HISTORY — DX: Depression, unspecified: F32.A

## 2011-08-22 HISTORY — DX: Major depressive disorder, single episode, unspecified: F32.9

## 2011-08-22 NOTE — MAU Note (Signed)
Onset of cramping in lower abdomen since yesterday worsening in intensity, G1  [redacted]w[redacted]d

## 2011-08-22 NOTE — MAU Note (Signed)
Pt c/o low abd cramping for the past two days and says it worsened today.  Denies any leaking or bleeding.

## 2011-08-22 NOTE — Discharge Instructions (Signed)

## 2011-08-30 ENCOUNTER — Inpatient Hospital Stay (HOSPITAL_COMMUNITY)
Admission: AD | Admit: 2011-08-30 | Discharge: 2011-09-02 | DRG: 766 | Disposition: A | Discharge status: Home / Self Care | Payer: 59 | Source: Ambulatory Visit | Attending: Obstetrics and Gynecology | Admitting: Obstetrics and Gynecology

## 2011-08-30 ENCOUNTER — Ambulatory Visit (HOSPITAL_COMMUNITY): Payer: Self-pay | Admitting: Psychology

## 2011-08-30 ENCOUNTER — Encounter (HOSPITAL_COMMUNITY): Payer: Self-pay | Admitting: Anesthesiology

## 2011-08-30 ENCOUNTER — Encounter (HOSPITAL_COMMUNITY)
Admission: AD | Disposition: A | Discharge status: Home / Self Care | Payer: Self-pay | Source: Ambulatory Visit | Attending: Obstetrics and Gynecology

## 2011-08-30 ENCOUNTER — Inpatient Hospital Stay (HOSPITAL_COMMUNITY): Payer: 59 | Admitting: Anesthesiology

## 2011-08-30 ENCOUNTER — Encounter (HOSPITAL_COMMUNITY): Payer: Self-pay | Admitting: *Deleted

## 2011-08-30 DIAGNOSIS — O09519 Supervision of elderly primigravida, unspecified trimester: Secondary | ICD-10-CM | POA: Diagnosis present

## 2011-08-30 DIAGNOSIS — Z98891 History of uterine scar from previous surgery: Secondary | ICD-10-CM

## 2011-08-30 DIAGNOSIS — F419 Anxiety disorder, unspecified: Secondary | ICD-10-CM

## 2011-08-30 DIAGNOSIS — Z22330 Carrier of Group B streptococcus: Secondary | ICD-10-CM

## 2011-08-30 DIAGNOSIS — O99892 Other specified diseases and conditions complicating childbirth: Secondary | ICD-10-CM | POA: Diagnosis present

## 2011-08-30 DIAGNOSIS — Z3490 Encounter for supervision of normal pregnancy, unspecified, unspecified trimester: Secondary | ICD-10-CM

## 2011-08-30 LAB — OB RESULTS CONSOLE GBS: GBS: POSITIVE

## 2011-08-30 LAB — CBC
Platelets: 170 10*3/uL (ref 150–400)
RDW: 14.2 % (ref 11.5–15.5)
WBC: 11.3 10*3/uL — ABNORMAL HIGH (ref 4.0–10.5)

## 2011-08-30 LAB — RPR: RPR Ser Ql: NONREACTIVE

## 2011-08-30 LAB — POCT FERN TEST: Fern Test: POSITIVE

## 2011-08-30 SURGERY — Surgical Case
Anesthesia: Epidural | Site: Abdomen | Wound class: Clean Contaminated

## 2011-08-30 MED ORDER — FLEET ENEMA 7-19 GM/118ML RE ENEM: 1.0000 | ENEMA | RECTAL | Status: DC | PRN

## 2011-08-30 MED ORDER — EPHEDRINE 5 MG/ML INJ: 10.0000 mg | INTRAVENOUS | Status: DC | PRN

## 2011-08-30 MED ORDER — MEPERIDINE HCL 25 MG/ML IJ SOLN
6.2500 mg | INTRAMUSCULAR | Status: DC | PRN
  Administered 2011-08-31: 6.25 mg via INTRAVENOUS

## 2011-08-30 MED ORDER — LACTATED RINGERS IV SOLN
INTRAVENOUS | Status: DC
  Administered 2011-08-30 (×6): via INTRAVENOUS

## 2011-08-30 MED ORDER — LIDOCAINE-EPINEPHRINE (PF) 2 %-1:200000 IJ SOLN
INTRAMUSCULAR | Status: AC
  Filled 2011-08-30: qty 20

## 2011-08-30 MED ORDER — OXYTOCIN 40 UNITS IN LACTATED RINGERS INFUSION - SIMPLE MED
1.0000 m[IU]/min | INTRAVENOUS | Status: DC
  Administered 2011-08-30: 1 m[IU]/min via INTRAVENOUS
  Filled 2011-08-30: qty 1000

## 2011-08-30 MED ORDER — IBUPROFEN 600 MG PO TABS: 600.0000 mg | ORAL_TABLET | Freq: Four times a day (QID) | ORAL | Status: DC | PRN

## 2011-08-30 MED ORDER — CITRIC ACID-SODIUM CITRATE 334-500 MG/5ML PO SOLN
30.0000 mL | ORAL | Status: DC | PRN
  Administered 2011-08-30: 30 mL via ORAL
  Filled 2011-08-30: qty 15

## 2011-08-30 MED ORDER — OXYTOCIN 40 UNITS IN LACTATED RINGERS INFUSION - SIMPLE MED: 62.5000 mL/h | Freq: Once | INTRAVENOUS | Status: DC

## 2011-08-30 MED ORDER — ONDANSETRON HCL 4 MG/2ML IJ SOLN: 4.0000 mg | Freq: Four times a day (QID) | INTRAMUSCULAR | Status: DC | PRN

## 2011-08-30 MED ORDER — BUTORPHANOL TARTRATE 2 MG/ML IJ SOLN
INTRAMUSCULAR | Status: AC
  Filled 2011-08-30: qty 1

## 2011-08-30 MED ORDER — DIPHENHYDRAMINE HCL 50 MG/ML IJ SOLN: 12.5000 mg | INTRAMUSCULAR | Status: DC | PRN

## 2011-08-30 MED ORDER — PHENYLEPHRINE 40 MCG/ML (10ML) SYRINGE FOR IV PUSH (FOR BLOOD PRESSURE SUPPORT): 80.0000 ug | PREFILLED_SYRINGE | INTRAVENOUS | Status: DC | PRN

## 2011-08-30 MED ORDER — PHENYLEPHRINE 40 MCG/ML (10ML) SYRINGE FOR IV PUSH (FOR BLOOD PRESSURE SUPPORT)
PREFILLED_SYRINGE | INTRAVENOUS | Status: AC
  Filled 2011-08-30: qty 5

## 2011-08-30 MED ORDER — PHENYLEPHRINE 40 MCG/ML (10ML) SYRINGE FOR IV PUSH (FOR BLOOD PRESSURE SUPPORT)
80.0000 ug | PREFILLED_SYRINGE | INTRAVENOUS | Status: DC | PRN
  Filled 2011-08-30: qty 5

## 2011-08-30 MED ORDER — PENICILLIN G POTASSIUM 5000000 UNITS IJ SOLR
5.0000 10*6.[IU] | Freq: Once | INTRAVENOUS | Status: AC
  Administered 2011-08-30: 5 10*6.[IU] via INTRAVENOUS
  Filled 2011-08-30: qty 5

## 2011-08-30 MED ORDER — LIDOCAINE HCL (PF) 1 % IJ SOLN
INTRAMUSCULAR | Status: DC | PRN
  Administered 2011-08-30 (×2): 8 mL

## 2011-08-30 MED ORDER — OXYCODONE-ACETAMINOPHEN 5-325 MG PO TABS: 1.0000 | ORAL_TABLET | ORAL | Status: DC | PRN

## 2011-08-30 MED ORDER — PHENYLEPHRINE HCL 10 MG/ML IJ SOLN
INTRAMUSCULAR | Status: DC | PRN
  Administered 2011-08-30 (×3): 80 ug via INTRAVENOUS

## 2011-08-30 MED ORDER — DEXTROSE 5 % IV SOLN
2.5000 10*6.[IU] | INTRAVENOUS | Status: DC
  Administered 2011-08-30 (×2): 2.5 10*6.[IU] via INTRAVENOUS
  Filled 2011-08-30 (×4): qty 2.5

## 2011-08-30 MED ORDER — OXYTOCIN BOLUS FROM INFUSION
250.0000 mL | Freq: Once | INTRAVENOUS | Status: DC
  Filled 2011-08-30: qty 500

## 2011-08-30 MED ORDER — EPHEDRINE 5 MG/ML INJ
INTRAVENOUS | Status: AC
  Filled 2011-08-30: qty 10

## 2011-08-30 MED ORDER — BUTORPHANOL TARTRATE 2 MG/ML IJ SOLN
1.0000 mg | Freq: Once | INTRAMUSCULAR | Status: AC
  Administered 2011-08-30: 1 mg via INTRAVENOUS

## 2011-08-30 MED ORDER — OXYTOCIN 10 UNIT/ML IJ SOLN
40.0000 [IU] | INTRAVENOUS | Status: DC | PRN
  Administered 2011-08-30: 40 [IU] via INTRAVENOUS

## 2011-08-30 MED ORDER — MEPERIDINE HCL 25 MG/ML IJ SOLN
INTRAMUSCULAR | Status: AC
  Filled 2011-08-30: qty 1

## 2011-08-30 MED ORDER — FENTANYL 2.5 MCG/ML BUPIVACAINE 1/10 % EPIDURAL INFUSION (WH - ANES)
INTRAMUSCULAR | Status: DC | PRN
  Administered 2011-08-30: 14 mL/h via EPIDURAL

## 2011-08-30 MED ORDER — FENTANYL CITRATE 0.05 MG/ML IJ SOLN: 25.0000 ug | INTRAMUSCULAR | Status: DC | PRN

## 2011-08-30 MED ORDER — SODIUM BICARBONATE 8.4 % IV SOLN
INTRAVENOUS | Status: DC | PRN
  Administered 2011-08-30: 10 mL via EPIDURAL

## 2011-08-30 MED ORDER — LACTATED RINGERS IV SOLN
INTRAVENOUS | Status: DC | PRN
  Administered 2011-08-30: 23:00:00 via INTRAVENOUS

## 2011-08-30 MED ORDER — CEFAZOLIN SODIUM 1-5 GM-% IV SOLN
INTRAVENOUS | Status: DC | PRN
  Administered 2011-08-30: 2 g via INTRAVENOUS

## 2011-08-30 MED ORDER — LACTATED RINGERS IV SOLN: 500.0000 mL | INTRAVENOUS | Status: DC | PRN

## 2011-08-30 MED ORDER — ONDANSETRON HCL 4 MG/2ML IJ SOLN
INTRAMUSCULAR | Status: DC | PRN
  Administered 2011-08-30: 4 mg via INTRAVENOUS

## 2011-08-30 MED ORDER — MORPHINE SULFATE 0.5 MG/ML IJ SOLN
INTRAMUSCULAR | Status: AC
  Filled 2011-08-30: qty 10

## 2011-08-30 MED ORDER — LIDOCAINE HCL (PF) 1 % IJ SOLN: 30.0000 mL | INTRAMUSCULAR | Status: DC | PRN

## 2011-08-30 MED ORDER — LACTATED RINGERS IV SOLN: 500.0000 mL | Freq: Once | INTRAVENOUS | Status: DC

## 2011-08-30 MED ORDER — FENTANYL 2.5 MCG/ML BUPIVACAINE 1/10 % EPIDURAL INFUSION (WH - ANES)
14.0000 mL/h | INTRAMUSCULAR | Status: DC
  Administered 2011-08-30: 14 mL/h via EPIDURAL
  Filled 2011-08-30 (×2): qty 60

## 2011-08-30 MED ORDER — OXYTOCIN 10 UNIT/ML IJ SOLN
INTRAMUSCULAR | Status: AC
  Filled 2011-08-30: qty 2

## 2011-08-30 MED ORDER — CEFAZOLIN SODIUM 1-5 GM-% IV SOLN
INTRAVENOUS | Status: AC
  Filled 2011-08-30: qty 100

## 2011-08-30 MED ORDER — MEPERIDINE HCL 25 MG/ML IJ SOLN
INTRAMUSCULAR | Status: DC | PRN
  Administered 2011-08-30: 25 mg via INTRAVENOUS

## 2011-08-30 MED ORDER — ACETAMINOPHEN 325 MG PO TABS: 650.0000 mg | ORAL_TABLET | ORAL | Status: DC | PRN

## 2011-08-30 MED ORDER — TERBUTALINE SULFATE 1 MG/ML IJ SOLN: 0.2500 mg | Freq: Once | INTRAMUSCULAR | Status: DC | PRN

## 2011-08-30 MED ORDER — ONDANSETRON HCL 4 MG/2ML IJ SOLN
INTRAMUSCULAR | Status: AC
  Filled 2011-08-30: qty 2

## 2011-08-30 MED ORDER — MORPHINE SULFATE (PF) 0.5 MG/ML IJ SOLN
INTRAMUSCULAR | Status: DC | PRN
  Administered 2011-08-30: 3 mg via EPIDURAL

## 2011-08-30 MED ORDER — EPHEDRINE 5 MG/ML INJ
10.0000 mg | INTRAVENOUS | Status: DC | PRN
  Filled 2011-08-30: qty 4

## 2011-08-30 SURGICAL SUPPLY — 31 items
CLOTH BEACON ORANGE TIMEOUT ST (SAFETY) ×2 IMPLANT
CONTAINER PREFILL 10% NBF 15ML (MISCELLANEOUS) IMPLANT
DRESSING TELFA 8X3 (GAUZE/BANDAGES/DRESSINGS) IMPLANT
DRSG VASELINE 3X18 (GAUZE/BANDAGES/DRESSINGS) ×2 IMPLANT
ELECT REM PT RETURN 9FT ADLT (ELECTROSURGICAL) ×2
ELECTRODE REM PT RTRN 9FT ADLT (ELECTROSURGICAL) ×1 IMPLANT
EXTRACTOR VACUUM KIWI (MISCELLANEOUS) IMPLANT
EXTRACTOR VACUUM M CUP 4 TUBE (SUCTIONS) IMPLANT
GAUZE SPONGE 4X4 12PLY STRL LF (GAUZE/BANDAGES/DRESSINGS) ×4 IMPLANT
GLOVE BIO SURGEON STRL SZ7.5 (GLOVE) ×4 IMPLANT
GOWN PREVENTION PLUS LG XLONG (DISPOSABLE) ×4 IMPLANT
GOWN PREVENTION PLUS XLARGE (GOWN DISPOSABLE) ×2 IMPLANT
KIT ABG SYR 3ML LUER SLIP (SYRINGE) IMPLANT
NDL HYPO 25X5/8 SAFETYGLIDE (NEEDLE) IMPLANT
NEEDLE HYPO 25X5/8 SAFETYGLIDE (NEEDLE) IMPLANT
NS IRRIG 1000ML POUR BTL (IV SOLUTION) ×2 IMPLANT
PACK C SECTION WH (CUSTOM PROCEDURE TRAY) ×2 IMPLANT
PAD ABD 7.5X8 STRL (GAUZE/BANDAGES/DRESSINGS) IMPLANT
RTRCTR C-SECT PINK 25CM LRG (MISCELLANEOUS) IMPLANT
SLEEVE SCD COMPRESS KNEE MED (MISCELLANEOUS) IMPLANT
STAPLER VISISTAT 35W (STAPLE) ×1 IMPLANT
SUT PLAIN 0 NONE (SUTURE) IMPLANT
SUT VIC AB 0 CT1 36 (SUTURE) ×16 IMPLANT
SUT VIC AB 3-0 CTX 36 (SUTURE) ×2 IMPLANT
SUT VIC AB 3-0 SH 27 (SUTURE)
SUT VIC AB 3-0 SH 27X BRD (SUTURE) IMPLANT
SUT VIC AB 4-0 KS 27 (SUTURE) IMPLANT
SUT VICRYL 0 TIES 12 18 (SUTURE) IMPLANT
TOWEL OR 17X24 6PK STRL BLUE (TOWEL DISPOSABLE) ×4 IMPLANT
TRAY FOLEY CATH 14FR (SET/KITS/TRAYS/PACK) ×2 IMPLANT
WATER STERILE IRR 1000ML POUR (IV SOLUTION) ×1 IMPLANT

## 2011-08-30 NOTE — H&P (Signed)
Virginia Simmons, Virginia Simmons                ACCOUNT NO.:  [id number]  MEDICAL RECORD NO.:  [id number]  LOCATION:  9168                          FACILITY:  [facility]  PHYSICIAN:  Malachi Pro. Ambrose Mantle, M.D. DATE OF BIRTH:  December 30, 1975  DATE OF ADMISSION:  08/30/2011 DATE OF DISCHARGE:                             HISTORY & PHYSICAL   HISTORY OF PRESENT ILLNESS:  This is a 36 year old black female, para 0, gravida 1, EDC September 15, 2011 based on an ultrasound at 7 weeks and 6 days on February 02, 2011.  The patient's prenatal course has been complicated by fibromyalgia, back pain, and desire to limit her work because of discomfort.  Actual physical abnormalities have been limited. She had an early 1 hour Glucola that was normal.  A 28-week Glucola that was high, and the 3 hour GTT that had only 1 abnormal value.  She came to the hospital today complaining of pain and was noted to have a relatively unchanged cervix, but in the process of observation in the maternity admission unit she had spontaneous rupture of her membranes. She was admitted and she continued to have significant pain, so an epidural was authorized.  Her blood group and type B positive, negative antibody.  Pap smear normal.  Rubella immune.  RPR nonreactive.  Urine culture negative.  Hepatitis B surface antigen negative.  HIV negative. GC and Chlamydia negative.  Hemoglobin electrophoresis AA.  The patient had a quad screen that was negative.  One-hour Glucola was 80 and early in her pregnancy it was 143 at the usual time and her 3 hour GTT fasting was 90, 1 hour was 188, 2 hour 115, and 3 hour 78.  She was advised to watch her carbohydrates.  PAST MEDICAL HISTORY:  She does have a history of ADD, bipolar.  She has a history of diabetes, although we have not be able to confirm that she is diabetic.  She has a history of fibroids, fibromyalgia diagnosed by Dr. Kellie Simmering.  She has a history of a foot fracture and has a history of high blood  pressure, which she has been off meds during the pregnancy.  PAST SURGICAL HISTORY:  Foot surgery at age 72 and hand surgery in the 5th grade.  ALLERGIES:  She says she got hives from a flu shot in 2010.  She has no known drug allergies.  She states that in 2007 she reacted to shellfish with her throat swelling and swelling in other parts of her body.  FAMILY HISTORY:  Father has asthma, diabetes, heart disease, high blood pressure, and kidney disease.  Maternal grandmother with breast cancer, heart attack, and paternal grandmother with a heart attack.  Her mother has high blood pressure.  The patient denies alcohol and illicit substance abuse.  She admits to smoking 5 to 10 cigarettes a day.  PHYSICAL EXAMINATION:  VITAL SIGNS:  Temperature is 98.2, pulse is 81, respirations 20, blood pressure 129/63. HEART:  Normal size and sounds.  No murmurs. LUNGS:  Clear to auscultation. ABDOMEN:  At her last prenatal visit on August 24, 2011, her fundal height was 38.5 cm.  The fetal heart tones are normal.  The patient is  writhing around in bed, it is difficult to judge how often she is contracting. PELVIS:  Her cervix is 2+ cm, 80-90% effaced, vertex is at a -3 station.  ADMITTING IMPRESSION:  Intrauterine pregnancy at 37 weeks and 5 days, positive group B strep, early labor, and intolerance of pain.  We have requested the patient receive an epidural.  After her pain is controlled, we will try to get her contractions regulated, so that she can make progress.  We are treating the group B strep with penicillin.     Malachi Pro. Ambrose Mantle, M.D.     TFH/MEDQ  D:  08/30/2011  T:  08/30/2011  Job:  [id number]

## 2011-08-30 NOTE — Anesthesia Preprocedure Evaluation (Signed)
Anesthesia Evaluation  Patient identified by MRN, date of birth, ID band Patient awake    Reviewed: Allergy & Precautions, H&P , NPO status , Patient's Chart, lab work & pertinent test results  Airway Mallampati: III TM Distance: >3 FB Neck ROM: full    Dental No notable dental hx.    Pulmonary neg pulmonary ROS,    Pulmonary exam normal       Cardiovascular     Neuro/Psych    GI/Hepatic negative GI ROS, Neg liver ROS,   Endo/Other  Morbid obesity  Renal/GU negative Renal ROS  negative genitourinary   Musculoskeletal negative musculoskeletal ROS (+)   Abdominal (+) + obese,   Peds negative pediatric ROS (+)  Hematology negative hematology ROS (+)   Anesthesia Other Findings   Reproductive/Obstetrics (+) Pregnancy                           Anesthesia Physical Anesthesia Plan  ASA: III  Anesthesia Plan: Epidural   Post-op Pain Management:    Induction:   Airway Management Planned:   Additional Equipment:   Intra-op Plan:   Post-operative Plan:   Informed Consent: I have reviewed the patients History and Physical, chart, labs and discussed the procedure including the risks, benefits and alternatives for the proposed anesthesia with the patient or authorized representative who has indicated his/her understanding and acceptance.     Plan Discussed with:   Anesthesia Plan Comments:         Anesthesia Quick Evaluation

## 2011-08-30 NOTE — Op Note (Signed)
Operative note on the Avamarie Stillman:  Date of the operation: 08/30/2011  Preoperative diagnosis: Intrauterine pregnancy 37 weeks 4 days, failure to progress in labor, nonreassuring fetal heart rate pattern  Postoperative diagnosis: Same  Operation: Low transverse cervical C-section  Operator: Gwenith Tschida  Anesthesia: Epidural  The patient was brought to the operating room and placed on the operating room table. Anesthesia was confirmed by Dr. Jean Rosenthal. The fetal heart tones were auscultated continuously. A time out was done. The abdomen was prepped with Betadine solution and draped as a sterile field. A marking pain and had been used to outline the location of the incision. The incision was made transversely through the lower abdomen including the skin and subcutaneous tissue and fascia. The fascia was separated from the rectus muscles superiorly and inferiorly. The rectus muscle was split in the midline and the peritoneum was opened vertically. The incision was stretched and an Alexis retractor was placed to expose the lower uterine segment. A short transverse incision was made in the lower uterine segment through the superficial layers of the myometrium. I went into the amniotic sac with my finger and stretched the incision by pulling superiorly and inferiorly. I reached down in the pelvis elevated the vertex through the incisional opening and delivered the baby. The nose and pharynx were suctioned with the bulb and the female infant was given to Dr. Andree Moro who was in attendance. She had to bag the baby for a short period of time and gave the baby Apgars of 3 at 1 minute and 8 at 5 minutes. I did not think the baby would require any resuscitation so I did not get a pH. Routine cord blood studies were obtained and the placenta was removed intact. The inside of the uterus was free of any products of conception, both tubes and ovaries appeared normal, and there might have been a fibroid about 6 cm in  diameter just at the left uterine cornu. The uterine incision was closed with 2 running sutures of 0 Vicryl locking the first layer and nonlocking on the second layer. Liberal irrigation confirmed hemostasis. The retractor was removed and the abdominal wall was closed in layers using interrupted sutures of 0 Vicryl on the  rectus muscle and peritoneum when it could be found. The fascia was closed with 2 running sutures of 0 Vicryl. The subcutaneous tissue was closed with a running suture of 3-0 Vicryl and staples were used on the skin. A sterile dressing was applied and the patient was returned to recovery in satisfactory condition. Sponge and needle counts were correct. Estimated blood loss 600 cc

## 2011-08-30 NOTE — Anesthesia Procedure Notes (Signed)
Epidural Patient location during procedure: OB Start time: 08/30/2011 1:41 PM End time: 08/30/2011 1:48 PM Reason for block: procedure for pain  Staffing Anesthesiologist: Sandrea Hughs Performed by: anesthesiologist   Preanesthetic Checklist Completed: patient identified, site marked, surgical consent, pre-op evaluation, timeout performed, IV checked, risks and benefits discussed and monitors and equipment checked  Epidural Patient position: sitting Prep: site prepped and draped and DuraPrep Patient monitoring: continuous pulse ox and blood pressure Approach: midline Injection technique: LOR air  Needle:  Needle type: Tuohy  Needle gauge: 17 G Needle length: 9 cm Needle insertion depth: 9 cm Catheter type: closed end flexible Catheter size: 19 Gauge Catheter at skin depth: 15 cm Test dose: negative and Other  Assessment Sensory level: T10 Events: blood not aspirated, injection not painful, no injection resistance, negative IV test and no paresthesia

## 2011-08-30 NOTE — Progress Notes (Signed)
Patient ID: Virginia Simmons, female   DOB: 10-31-1975, 36 y.o.   MRN: [id number] I called to ask about the pt  around 9:15 PM and was told the nurse was transferring her other pt and she would call me back. At 9:26 PM she called me and stated the pt had made no progress and was having a deceleration so I came to see the pt. My exam showed the cervix to be 3 cm 90% effaced and the vertex at -2 station On reviewing the strip it appears the pt has had intermittent decelerations since 7:52 PM I m going to the OR for a c section. The pitocin was stopped at 9:26 PM and O2 HAS BEEN APPLIED SINCE THEN . There have been no decelerations since 9:36 pm tHERE IS SOME FETAL TACHYCARDIA.

## 2011-08-30 NOTE — Progress Notes (Signed)
Patient ID: Virginia Simmons, female   DOB: June 27, 1975, 36 y.o.   MRN: [id number] Pitocin at 13 mu/ minute and the cervix is 3 cm 90% effaced and the vertex is at -2 station. The FHT's look normal

## 2011-08-30 NOTE — Transfer of Care (Signed)
Immediate Anesthesia Transfer of Care Note  Patient: Virginia Simmons  Procedure(s) Performed: Procedure(s) (LRB): CESAREAN SECTION (N/A)  Patient Location: PACU  Anesthesia Type: Epidural  Level of Consciousness: awake, alert  and oriented  Airway & Oxygen Therapy: Patient Spontanous Breathing  Post-op Assessment: Report given to PACU RN and Post -op Vital signs reviewed and stable  Post vital signs: Reviewed and stable  Complications: No apparent anesthesia complications

## 2011-08-30 NOTE — MAU Note (Signed)
Patient state she is having contractions every 5 minutes with a little bloody show. Reports good fetal movement.

## 2011-08-31 ENCOUNTER — Ambulatory Visit (HOSPITAL_COMMUNITY): Payer: Self-pay | Admitting: Psychology

## 2011-08-31 ENCOUNTER — Encounter (HOSPITAL_COMMUNITY): Payer: Self-pay

## 2011-08-31 DIAGNOSIS — Z3490 Encounter for supervision of normal pregnancy, unspecified, unspecified trimester: Secondary | ICD-10-CM

## 2011-08-31 DIAGNOSIS — Z98891 History of uterine scar from previous surgery: Secondary | ICD-10-CM

## 2011-08-31 LAB — CBC
HCT: 34.3 % — ABNORMAL LOW (ref 36.0–46.0)
MCV: 89.3 fL (ref 78.0–100.0)
Platelets: 174 10*3/uL (ref 150–400)
RBC: 3.84 MIL/uL — ABNORMAL LOW (ref 3.87–5.11)
WBC: 10.4 10*3/uL (ref 4.0–10.5)

## 2011-08-31 MED ORDER — KETOROLAC TROMETHAMINE 60 MG/2ML IM SOLN
INTRAMUSCULAR | Status: AC
  Administered 2011-08-31: 60 mg via INTRAMUSCULAR
  Filled 2011-08-31: qty 2

## 2011-08-31 MED ORDER — DIPHENHYDRAMINE HCL 50 MG/ML IJ SOLN: 25.0000 mg | INTRAMUSCULAR | Status: DC | PRN

## 2011-08-31 MED ORDER — CEFAZOLIN SODIUM-DEXTROSE 2-3 GM-% IV SOLR
2.0000 g | Freq: Three times a day (TID) | INTRAVENOUS | Status: AC
  Administered 2011-08-31 (×2): 2 g via INTRAVENOUS
  Filled 2011-08-31 (×2): qty 50

## 2011-08-31 MED ORDER — DIPHENHYDRAMINE HCL 25 MG PO CAPS
25.0000 mg | ORAL_CAPSULE | ORAL | Status: DC | PRN
  Administered 2011-08-31 (×2): 25 mg via ORAL
  Filled 2011-08-31: qty 1

## 2011-08-31 MED ORDER — OXYTOCIN 40 UNITS IN LACTATED RINGERS INFUSION - SIMPLE MED: 62.5000 mL/h | INTRAVENOUS | Status: AC

## 2011-08-31 MED ORDER — DIPHENHYDRAMINE HCL 25 MG PO CAPS
25.0000 mg | ORAL_CAPSULE | Freq: Four times a day (QID) | ORAL | Status: DC | PRN
  Filled 2011-08-31: qty 1

## 2011-08-31 MED ORDER — NALOXONE HCL 0.4 MG/ML IJ SOLN
1.0000 ug/kg/h | INTRAMUSCULAR | Status: DC | PRN
  Filled 2011-08-31: qty 2.5

## 2011-08-31 MED ORDER — TETANUS-DIPHTH-ACELL PERTUSSIS 5-2.5-18.5 LF-MCG/0.5 IM SUSP: 0.5000 mL | Freq: Once | INTRAMUSCULAR | Status: DC

## 2011-08-31 MED ORDER — SIMETHICONE 80 MG PO CHEW
80.0000 mg | CHEWABLE_TABLET | Freq: Three times a day (TID) | ORAL | Status: DC
  Administered 2011-08-31 – 2011-09-02 (×6): 80 mg via ORAL

## 2011-08-31 MED ORDER — SCOPOLAMINE 1 MG/3DAYS TD PT72
MEDICATED_PATCH | TRANSDERMAL | Status: AC
  Administered 2011-08-31: 1.5 mg via TRANSDERMAL
  Filled 2011-08-31: qty 1

## 2011-08-31 MED ORDER — NALBUPHINE SYRINGE 5 MG/0.5 ML
5.0000 mg | INJECTION | INTRAMUSCULAR | Status: DC | PRN
  Filled 2011-08-31: qty 1

## 2011-08-31 MED ORDER — ONDANSETRON HCL 4 MG/2ML IJ SOLN: 4.0000 mg | Freq: Three times a day (TID) | INTRAMUSCULAR | Status: DC | PRN

## 2011-08-31 MED ORDER — MENTHOL 3 MG MT LOZG: 1.0000 | LOZENGE | OROMUCOSAL | Status: DC | PRN

## 2011-08-31 MED ORDER — CALCIUM CARBONATE ANTACID 500 MG PO CHEW
1.0000 | CHEWABLE_TABLET | Freq: Three times a day (TID) | ORAL | Status: DC
  Administered 2011-08-31 – 2011-09-02 (×5): 200 mg via ORAL
  Filled 2011-08-31 (×5): qty 1

## 2011-08-31 MED ORDER — ONDANSETRON HCL 4 MG/2ML IJ SOLN: 4.0000 mg | INTRAMUSCULAR | Status: DC | PRN

## 2011-08-31 MED ORDER — SCOPOLAMINE 1 MG/3DAYS TD PT72
1.0000 | MEDICATED_PATCH | Freq: Once | TRANSDERMAL | Status: DC
  Administered 2011-08-31: 1.5 mg via TRANSDERMAL

## 2011-08-31 MED ORDER — NALOXONE HCL 0.4 MG/ML IJ SOLN: 0.4000 mg | INTRAMUSCULAR | Status: DC | PRN

## 2011-08-31 MED ORDER — BISMUTH SUBSALICYLATE 262 MG/15ML PO SUSP: 15.0000 mL | Freq: Four times a day (QID) | ORAL | Status: DC | PRN

## 2011-08-31 MED ORDER — METOCLOPRAMIDE HCL 5 MG/ML IJ SOLN: 10.0000 mg | Freq: Three times a day (TID) | INTRAMUSCULAR | Status: DC | PRN

## 2011-08-31 MED ORDER — IBUPROFEN 600 MG PO TABS: 600.0000 mg | ORAL_TABLET | Freq: Four times a day (QID) | ORAL | Status: DC | PRN

## 2011-08-31 MED ORDER — OXYCODONE-ACETAMINOPHEN 5-325 MG PO TABS
1.0000 | ORAL_TABLET | ORAL | Status: DC | PRN
  Administered 2011-08-31 – 2011-09-02 (×8): 2 via ORAL
  Filled 2011-08-31 (×8): qty 2

## 2011-08-31 MED ORDER — KETOROLAC TROMETHAMINE 60 MG/2ML IM SOLN
60.0000 mg | Freq: Once | INTRAMUSCULAR | Status: AC | PRN
  Administered 2011-08-31: 60 mg via INTRAMUSCULAR

## 2011-08-31 MED ORDER — WITCH HAZEL-GLYCERIN EX PADS: 1.0000 "application " | MEDICATED_PAD | CUTANEOUS | Status: DC | PRN

## 2011-08-31 MED ORDER — IBUPROFEN 600 MG PO TABS
600.0000 mg | ORAL_TABLET | Freq: Four times a day (QID) | ORAL | Status: DC
  Administered 2011-08-31 – 2011-09-02 (×7): 600 mg via ORAL
  Filled 2011-08-31 (×9): qty 1

## 2011-08-31 MED ORDER — KETOROLAC TROMETHAMINE 30 MG/ML IJ SOLN
30.0000 mg | Freq: Four times a day (QID) | INTRAMUSCULAR | Status: AC | PRN
  Administered 2011-08-31: 30 mg via INTRAVENOUS
  Filled 2011-08-31: qty 1

## 2011-08-31 MED ORDER — SIMETHICONE 80 MG PO CHEW
80.0000 mg | CHEWABLE_TABLET | ORAL | Status: DC | PRN
  Administered 2011-08-31: 80 mg via ORAL

## 2011-08-31 MED ORDER — MEASLES, MUMPS & RUBELLA VAC ~~LOC~~ INJ
0.5000 mL | INJECTION | Freq: Once | SUBCUTANEOUS | Status: DC
  Filled 2011-08-31: qty 0.5

## 2011-08-31 MED ORDER — DIPHENHYDRAMINE HCL 50 MG/ML IJ SOLN: 12.5000 mg | INTRAMUSCULAR | Status: DC | PRN

## 2011-08-31 MED ORDER — ZOLPIDEM TARTRATE 5 MG PO TABS: 5.0000 mg | ORAL_TABLET | Freq: Every evening | ORAL | Status: DC | PRN

## 2011-08-31 MED ORDER — KETOROLAC TROMETHAMINE 30 MG/ML IJ SOLN: 30.0000 mg | Freq: Four times a day (QID) | INTRAMUSCULAR | Status: AC | PRN

## 2011-08-31 MED ORDER — ONDANSETRON HCL 4 MG PO TABS: 4.0000 mg | ORAL_TABLET | ORAL | Status: DC | PRN

## 2011-08-31 MED ORDER — SODIUM CHLORIDE 0.9 % IJ SOLN: 3.0000 mL | INTRAMUSCULAR | Status: DC | PRN

## 2011-08-31 MED ORDER — SENNOSIDES-DOCUSATE SODIUM 8.6-50 MG PO TABS
2.0000 | ORAL_TABLET | Freq: Every day | ORAL | Status: DC
  Administered 2011-08-31 – 2011-09-01 (×2): 2 via ORAL

## 2011-08-31 MED ORDER — LACTATED RINGERS IV SOLN
INTRAVENOUS | Status: DC
  Administered 2011-08-31: 09:00:00 via INTRAVENOUS

## 2011-08-31 MED ORDER — LANOLIN HYDROUS EX OINT: 1.0000 "application " | TOPICAL_OINTMENT | CUTANEOUS | Status: DC | PRN

## 2011-08-31 MED ORDER — NALBUPHINE SYRINGE 5 MG/0.5 ML
5.0000 mg | INJECTION | INTRAMUSCULAR | Status: DC | PRN
  Administered 2011-08-31: 10 mg via INTRAVENOUS
  Filled 2011-08-31: qty 1

## 2011-08-31 MED ORDER — ALPRAZOLAM 0.5 MG PO TABS
0.5000 mg | ORAL_TABLET | Freq: Two times a day (BID) | ORAL | Status: DC | PRN
  Administered 2011-08-31: 0.5 mg via ORAL
  Filled 2011-08-31: qty 1

## 2011-08-31 MED ORDER — MEPERIDINE HCL 25 MG/ML IJ SOLN
INTRAMUSCULAR | Status: AC
  Filled 2011-08-31: qty 1

## 2011-08-31 MED ORDER — OXYCODONE-ACETAMINOPHEN 5-325 MG PO TABS
1.0000 | ORAL_TABLET | ORAL | Status: DC | PRN
  Administered 2011-08-31: 1 via ORAL
  Filled 2011-08-31: qty 2

## 2011-08-31 MED ORDER — DIBUCAINE 1 % RE OINT: 1.0000 "application " | TOPICAL_OINTMENT | RECTAL | Status: DC | PRN

## 2011-08-31 NOTE — Progress Notes (Signed)
Patient ID: Virginia Simmons, female   DOB: 08/16/1975, 36 y.o.   MRN: [id number] #1 the pt felt warm to the L&D nurse during labor but never was able to document fever. After delivery temp 100.5 This AM the pt is afebrile The urine is concentrated and output for 8 hours 250 cc's. Will liberalize fluids. HGB stable.

## 2011-08-31 NOTE — Addendum Note (Signed)
Addendum  created 08/31/11 1036 by Shanon Payor, CRNA   Modules edited:Notes Section

## 2011-08-31 NOTE — Progress Notes (Signed)
UR chart review completed.  

## 2011-08-31 NOTE — Anesthesia Postprocedure Evaluation (Signed)
  Anesthesia Post-op Note  Patient: Virginia Simmons  Procedure(s) Performed: Procedure(s) (LRB): CESAREAN SECTION (N/A)  Patient is awake, responsive, moving her legs, and has signs of resolution of her numbness. Pain and nausea are reasonably well controlled. Vital signs are stable and clinically acceptable. Oxygen saturation is clinically acceptable. There are no apparent anesthetic complications at this time. Patient is ready for discharge.

## 2011-08-31 NOTE — Clinical Social Work Maternal (Signed)
Clinical Social Work Department PSYCHOSOCIAL ASSESSMENT - MATERNAL/CHILD 08/31/2011  Patient:  Virginia Simmons, Virginia Simmons  Account Number:  [id number]  Admit Date:  08/30/2011  Marjo Bicker Name:   Carola Rhine Frisby    Clinical Social Worker:  Andy Gauss   Date/Time:  08/31/2011 12:30 PM  Date Referred:  08/31/2011   Referral source  CN     Referred reason  Behavioral Health Issues   Other referral source:    I:  FAMILY / HOME ENVIRONMENT Child's legal guardian:  PARENT  Guardian - Name Guardian - Age Guardian - Address  Virginia Simmons 35 [address]; [place], [place] [address]  Virginia Simmons 25    Other household support members/support persons Other support:   Virginia Simmons, mother  [place] Simmons, sister    II  PSYCHOSOCIAL DATA Information Source:  Patient Interview  Event organiser Employment:   Surveyor, quantity resources:  [employer] If Medicaid - County:  [place] Other  [facility]  Food Stamps   School / Grade:   Maternity Care Coordinator / Child Services Coordination / Early Interventions:  Cultural issues impacting care:    III  STRENGTHS Strengths  Adequate Resources  Home prepared for Child (including basic supplies)  Supportive family/friends   Strength comment:    IV  RISK FACTORS AND CURRENT PROBLEMS Current Problem:  YES   Risk Factor & Current Problem Patient Issue Family Issue Risk Factor / Current Problem Comment  Mental Illness Y N Hx of bipolar disorder, anxiety and ADHD    V  SOCIAL WORK ASSESSMENT Sw met with pt to assess history bipolar and anxiety disorder.  Pt admits to a long history of mental illnesses since early childhood.  Most recently, pt was diagnosed with bipolar disorder and social anxiety by staff at [facility] in [place] 2013.  Pt told Sw that she has received mental health treatment from [facility] for the past 3 years.  She sees her therapist once a week and psychiatrist once every 2 weeks.  Prior to pregnancy, pt  was prescribed Haldol, Xanax, Seroquel 100mg  and Vistaril 10mg .  She stopped taking the medication during the pregnancy to avoid unwanted side effects to infant.  Pt told Sw that she was not able to cope well without the medication.  She has an appointment scheduled at [facility], September 05, 2011 to be reevaluated and prescribed medication.  Pt is in the process of trying to obtain disability, as she was "fired" from her job (after 8 years) for aggressive behavior towards staff.  She identified her mother and sister, as her primary support system.  According to the pt, her mother and sister will come stay at her to help care for the baby, upon discharge.  Pt reports feeling comfortable caring for the infant during hospitalization.  Pt expressed some anxious feelings now and asked if she could be given something to help her relax.  Sw conveyed requested to RN. Pt's younger brother is at the bedside.  Pt appears to be self aware, continues to receive intensive mental health treatment and has scheduled an appointment to restart medication.  Pt was able to answer Sw questions appropriately and appears to be happy about becoming a mother.  Sw referred pt to [facility] program and will continue to follow and assist further as needed.      VI SOCIAL WORK PLAN Social Work Plan  No Further Intervention Required / No Barriers to Discharge   Type of pt/family education:   If child protective  services report - county:   If child protective services report - date:   Information/referral to community resources comment:   Other social work plan:

## 2011-08-31 NOTE — Anesthesia Postprocedure Evaluation (Signed)
  Anesthesia Post-op Note  Patient: Virginia Simmons  Procedure(s) Performed: Procedure(s) (LRB): CESAREAN SECTION (N/A)  Patient Location: Mother/Baby  Anesthesia Type: Spinal  Level of Consciousness: awake, alert  and oriented  Airway and Oxygen Therapy: Patient Spontanous Breathing  Post-op Pain: none  Post-op Assessment: Post-op Vital signs reviewed and Patient's Cardiovascular Status Stable  Post-op Vital Signs: Reviewed and stable  Complications: No apparent anesthesia complications

## 2011-09-01 MED ORDER — BISACODYL 10 MG RE SUPP
10.0000 mg | Freq: Every day | RECTAL | Status: DC | PRN
  Administered 2011-09-01: 10 mg via RECTAL
  Filled 2011-09-01: qty 1

## 2011-09-01 NOTE — Progress Notes (Signed)
POD #2 LTCS Doing ok, sore Afeb, VSS Abd- soft, fundus firm, incision appears to be intact-dressing is pulling off Continue routine care

## 2011-09-02 MED ORDER — OXYCODONE-ACETAMINOPHEN 5-325 MG PO TABS: 1.0000 | ORAL_TABLET | ORAL | Status: AC | PRN

## 2011-09-02 MED ORDER — IBUPROFEN 600 MG PO TABS: 600.0000 mg | ORAL_TABLET | Freq: Four times a day (QID) | ORAL | Status: AC

## 2011-09-02 NOTE — Progress Notes (Signed)
POD #3 LTCS Doing ok Afeb, VSS Abd- soft, fundus firm, incision intact D/c home

## 2011-09-02 NOTE — Discharge Summary (Signed)
Obstetric Discharge Summary Reason for Admission: onset of labor and rupture of membranes Prenatal Procedures: none Intrapartum Procedures: cesarean: low cervical, transverse Postpartum Procedures: none Complications-Operative and Postpartum: none Hemoglobin  Date Value Range Status  08/31/2011 10.8* 12.0 - 15.0 g/dL Final     HCT  Date Value Range Status  08/31/2011 34.3* 36.0 - 46.0 % Final    Physical Exam:  General: alert Lochia: appropriate Uterine Fundus: firm Incision: healing well  Discharge Diagnoses: Term Pregnancy-delivered, arrest of dilation at 3 cm, non-reassuring FHT  Discharge Information: Date: 09/02/2011 Activity: pelvic rest and no strenuous activity Diet: routine Medications: Ibuprofen and Percocet Condition: stable Instructions: refer to practice specific booklet Discharge to: home Follow-up Information    Follow up with Bing Plume, MD. Schedule an appointment as soon as possible for a visit in 5 days. (for staple removal)    Contact information:   [employer] [employer]. [address] [place], [address] [place] [address] [phone]          Newborn Data: Live born female  Birth Weight: 6 lb 4.5 oz (2850 g) APGAR: 3, 8  Home with mother.  Vinal Rosengrant D 09/02/2011, 10:29 AM

## 2011-09-02 NOTE — Discharge Instructions (Signed)
As per discharge pamphlet °

## 2011-09-05 ENCOUNTER — Ambulatory Visit (INDEPENDENT_AMBULATORY_CARE_PROVIDER_SITE_OTHER): Payer: 59 | Admitting: Psychiatry

## 2011-09-05 VITALS — BP 142/98 | HR 105 | Wt 283.6 lb

## 2011-09-05 DIAGNOSIS — F319 Bipolar disorder, unspecified: Secondary | ICD-10-CM

## 2011-09-05 MED ORDER — QUETIAPINE FUMARATE 100 MG PO TABS: 100.0000 mg | ORAL_TABLET | Freq: Two times a day (BID) | ORAL | Status: DC
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
## 2011-09-11 ENCOUNTER — Ambulatory Visit (INDEPENDENT_AMBULATORY_CARE_PROVIDER_SITE_OTHER): Payer: 59 | Admitting: Psychology

## 2011-09-11 DIAGNOSIS — F319 Bipolar disorder, unspecified: Secondary | ICD-10-CM
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
## 2011-09-18 ENCOUNTER — Telehealth (HOSPITAL_COMMUNITY): Payer: Self-pay | Admitting: Psychology

## 2011-09-18 NOTE — Telephone Encounter (Signed)
Caelyn says that the baby wears her out, so she is trying to nap when she naps.  Has been seen twice in the office last week and was told she is too active, preventing herself from healing well.  Told to take it easier.  She is also complaining that her medication is causing her to feel groggy all day even though she only takes it at night.  Will see Dr. Lolly Mustache tomorrow and talk to him about it.

## 2011-09-19 ENCOUNTER — Ambulatory Visit (HOSPITAL_COMMUNITY): Payer: Self-pay | Admitting: Psychiatry

## 2011-09-28 ENCOUNTER — Encounter (HOSPITAL_COMMUNITY): Payer: Self-pay | Admitting: Psychiatry

## 2011-09-28 ENCOUNTER — Ambulatory Visit (INDEPENDENT_AMBULATORY_CARE_PROVIDER_SITE_OTHER): Payer: 59 | Admitting: Psychiatry

## 2011-09-28 DIAGNOSIS — F319 Bipolar disorder, unspecified: Secondary | ICD-10-CM

## 2011-09-28 MED ORDER — QUETIAPINE FUMARATE 100 MG PO TABS: 100.0000 mg | ORAL_TABLET | Freq: Every day | ORAL | Status: DC

## 2011-09-28 MED ORDER — HYDROXYZINE PAMOATE 25 MG PO CAPS: 25.0000 mg | ORAL_CAPSULE | Freq: Two times a day (BID) | ORAL | Status: AC
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
## 2011-10-16 ENCOUNTER — Ambulatory Visit (INDEPENDENT_AMBULATORY_CARE_PROVIDER_SITE_OTHER): Payer: 59 | Admitting: Psychology

## 2011-10-16 DIAGNOSIS — F3130 Bipolar disorder, current episode depressed, mild or moderate severity, unspecified: Secondary | ICD-10-CM
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
## 2011-10-26 ENCOUNTER — Ambulatory Visit (HOSPITAL_COMMUNITY): Payer: Self-pay | Admitting: Psychology

## 2011-10-28 ENCOUNTER — Encounter (HOSPITAL_COMMUNITY): Payer: Self-pay | Admitting: *Deleted

## 2011-10-28 ENCOUNTER — Emergency Department (HOSPITAL_COMMUNITY)
Admission: EM | Admit: 2011-10-28 | Discharge: 2011-10-28 | Disposition: A | Discharge status: Home / Self Care | Payer: 59 | Source: Home / Self Care | Attending: Family Medicine | Admitting: Family Medicine

## 2011-10-28 DIAGNOSIS — J069 Acute upper respiratory infection, unspecified: Secondary | ICD-10-CM

## 2011-10-28 DIAGNOSIS — Z720 Tobacco use: Secondary | ICD-10-CM

## 2011-10-28 MED ORDER — FLUTICASONE PROPIONATE 50 MCG/ACT NA SUSP: 1.0000 | Freq: Two times a day (BID) | NASAL | Status: AC

## 2011-10-28 MED ORDER — CEFUROXIME AXETIL 250 MG PO TABS: 250.0000 mg | ORAL_TABLET | Freq: Two times a day (BID) | ORAL | Status: DC

## 2011-10-28 MED ORDER — FEXOFENADINE HCL 180 MG PO TABS: 180.0000 mg | ORAL_TABLET | Freq: Every day | ORAL | Status: AC

## 2011-10-28 NOTE — ED Notes (Signed)
Pt with c/o headache and sinus congestion x 4 days progressively worse - taking otc medications without relief - history of sinus infection

## 2011-10-28 NOTE — ED Provider Notes (Signed)
History     CSN: [id number]  Arrival date & time 10/28/11  1616   First MD Initiated Contact with Patient 10/28/11 1619      Chief Complaint  Patient presents with  . Headache  . Nasal Congestion    (Consider location/radiation/quality/duration/timing/severity/associated sxs/prior treatment) Patient is a 36 y.o. female presenting with headaches. The history is provided by the patient.  Headache The primary symptoms include headaches. Primary symptoms do not include fever, nausea or vomiting. The symptoms began 3 to 5 days ago. The symptoms are unchanged.  Associated medical issues comments: smoker.    Past Medical History  Diagnosis Date  . Diabetes mellitus   . Hypertension   . Fibromyalgia lupus  . Depression     Past Surgical History  Procedure Date  . Hand surgery   . Foot fracture surgery   . Cesarean section 08/30/2011    Procedure: CESAREAN SECTION;  Surgeon: Bing Plume, MD;  Location: [facility];  Service: Gynecology;  Laterality: N/A;    Family History  Problem Relation Age of Onset  . Hypertension Father   . Diabetes Father   . Drug abuse Father   . Bipolar disorder Sister     History  Substance Use Topics  . Smoking status: Current Everyday Smoker -- 0.2 packs/day for 10 years    Types: Cigarettes  . Smokeless tobacco: Never Used  . Alcohol Use: No    OB History    Grav Para Term Preterm Abortions TAB SAB Ect Mult Living   1 1 1  0 0 0 0 0 0 1      Review of Systems  Constitutional: Negative for fever.  HENT: Positive for congestion, rhinorrhea, sneezing and postnasal drip.   Respiratory: Positive for cough.   Cardiovascular: Negative.   Gastrointestinal: Negative for nausea and vomiting.  Neurological: Positive for headaches.    Allergies  Review of patient's allergies indicates no known allergies.  Home Medications   Current Outpatient Rx  Name Route Sig Dispense Refill  . BENAZEPRIL-HYDROCHLOROTHIAZIDE 20-25 MG PO TABS Oral Take  1 tablet by mouth daily.    Marland Kitchen HYDROXYZINE PAMOATE 25 MG PO CAPS Oral Take 25 mg by mouth 2 (two) times daily.    Marland Kitchen ALKA-SELTZER PLS NIGHT CLD/FLU PO Oral Take by mouth.    . QUETIAPINE FUMARATE 100 MG PO TABS Oral Take 1 tablet (100 mg total) by mouth at bedtime. 30 tablet 1  . BISMUTH SUBSALICYLATE 262 MG/15ML PO SUSP Oral Take 15 mLs by mouth every 6 (six) hours as needed.      Marland Kitchen CALCIUM CARBONATE ANTACID 500 MG PO CHEW Oral Chew 1 tablet by mouth daily. Acid reflux    . CEFUROXIME AXETIL 250 MG PO TABS Oral Take 1 tablet (250 mg total) by mouth 2 (two) times daily. 20 tablet 0  . FEXOFENADINE HCL 180 MG PO TABS Oral Take 1 tablet (180 mg total) by mouth daily. 30 tablet 1  . FLUTICASONE PROPIONATE 50 MCG/ACT NA SUSP Nasal Place 1 spray into the nose 2 (two) times daily. 1 g 2    BP 107/75  Pulse 107  Temp 100 F (37.8 C) (Oral)  Resp 20  SpO2 99%  Physical Exam  Nursing note and vitals reviewed. Constitutional: She is oriented to person, place, and time. She appears well-developed and well-nourished.  HENT:  Head: Normocephalic.  Right Ear: External ear normal.  Left Ear: External ear normal.  Mouth/Throat: Oropharynx is clear and moist.  Eyes: Conjunctivae  are normal. Pupils are equal, round, and reactive to light.  Neck: Normal range of motion. Neck supple.  Cardiovascular: Normal rate and regular rhythm.   Pulmonary/Chest: Breath sounds normal.  Lymphadenopathy:    She has no cervical adenopathy.  Neurological: She is alert and oriented to person, place, and time.  Skin: Skin is warm and dry.    ED Course  Procedures (including critical care time)  Labs Reviewed - No data to display No results found.   1. URI, acute   2. Bronchitis due to tobacco use       MDM          Linna Hoff, MD 10/28/11 [phone]

## 2011-10-31 ENCOUNTER — Emergency Department (HOSPITAL_COMMUNITY)
Admission: EM | Admit: 2011-10-31 | Discharge: 2011-10-31 | Disposition: A | Discharge status: Home / Self Care | Payer: 59 | Attending: Emergency Medicine | Admitting: Emergency Medicine

## 2011-10-31 ENCOUNTER — Ambulatory Visit (HOSPITAL_COMMUNITY): Payer: Self-pay | Admitting: Psychology

## 2011-10-31 ENCOUNTER — Encounter (HOSPITAL_COMMUNITY): Payer: Self-pay | Admitting: *Deleted

## 2011-10-31 ENCOUNTER — Emergency Department (HOSPITAL_COMMUNITY): Payer: 59

## 2011-10-31 DIAGNOSIS — J329 Chronic sinusitis, unspecified: Secondary | ICD-10-CM | POA: Insufficient documentation

## 2011-10-31 DIAGNOSIS — R51 Headache: Secondary | ICD-10-CM | POA: Insufficient documentation

## 2011-10-31 DIAGNOSIS — F329 Major depressive disorder, single episode, unspecified: Secondary | ICD-10-CM | POA: Insufficient documentation

## 2011-10-31 DIAGNOSIS — F3289 Other specified depressive episodes: Secondary | ICD-10-CM | POA: Insufficient documentation

## 2011-10-31 DIAGNOSIS — I10 Essential (primary) hypertension: Secondary | ICD-10-CM | POA: Insufficient documentation

## 2011-10-31 DIAGNOSIS — H00039 Abscess of eyelid unspecified eye, unspecified eyelid: Secondary | ICD-10-CM | POA: Insufficient documentation

## 2011-10-31 DIAGNOSIS — Z79899 Other long term (current) drug therapy: Secondary | ICD-10-CM | POA: Insufficient documentation

## 2011-10-31 DIAGNOSIS — L03213 Periorbital cellulitis: Secondary | ICD-10-CM

## 2011-10-31 DIAGNOSIS — F17200 Nicotine dependence, unspecified, uncomplicated: Secondary | ICD-10-CM | POA: Insufficient documentation

## 2011-10-31 DIAGNOSIS — E119 Type 2 diabetes mellitus without complications: Secondary | ICD-10-CM | POA: Insufficient documentation

## 2011-10-31 DIAGNOSIS — IMO0001 Reserved for inherently not codable concepts without codable children: Secondary | ICD-10-CM | POA: Insufficient documentation

## 2011-10-31 DIAGNOSIS — N289 Disorder of kidney and ureter, unspecified: Secondary | ICD-10-CM | POA: Insufficient documentation

## 2011-10-31 DIAGNOSIS — H5710 Ocular pain, unspecified eye: Secondary | ICD-10-CM | POA: Insufficient documentation

## 2011-10-31 LAB — CBC WITH DIFFERENTIAL/PLATELET
Basophils Absolute: 0 10*3/uL (ref 0.0–0.1)
Basophils Relative: 0 % (ref 0–1)
Eosinophils Relative: 0 % (ref 0–5)
HCT: 41.9 % (ref 36.0–46.0)
Lymphocytes Relative: 22 % (ref 12–46)
MCHC: 32.2 g/dL (ref 30.0–36.0)
Monocytes Absolute: 0.5 10*3/uL (ref 0.1–1.0)
Neutro Abs: 6.9 10*3/uL (ref 1.7–7.7)
Platelets: 316 10*3/uL (ref 150–400)
RDW: 14 % (ref 11.5–15.5)
WBC: 9.4 10*3/uL (ref 4.0–10.5)

## 2011-10-31 LAB — POCT I-STAT, CHEM 8
BUN: 15 mg/dL (ref 6–23)
Calcium, Ion: 1.1 mmol/L — ABNORMAL LOW (ref 1.12–1.23)
Chloride: 96 mEq/L (ref 96–112)
HCT: 44 % (ref 36.0–46.0)
Sodium: 137 mEq/L (ref 135–145)
TCO2: 29 mmol/L (ref 0–100)

## 2011-10-31 MED ORDER — CEPHALEXIN 250 MG PO CAPS
500.0000 mg | ORAL_CAPSULE | Freq: Once | ORAL | Status: AC
  Administered 2011-10-31: 500 mg via ORAL
  Filled 2011-10-31: qty 2

## 2011-10-31 MED ORDER — ACETAMINOPHEN 325 MG PO TABS
650.0000 mg | ORAL_TABLET | Freq: Once | ORAL | Status: AC
  Administered 2011-10-31: 650 mg via ORAL
  Filled 2011-10-31: qty 2

## 2011-10-31 MED ORDER — DIPHENHYDRAMINE HCL 50 MG/ML IJ SOLN
25.0000 mg | Freq: Once | INTRAMUSCULAR | Status: AC
  Administered 2011-10-31: 25 mg via INTRAVENOUS
  Filled 2011-10-31: qty 1

## 2011-10-31 MED ORDER — SULFAMETHOXAZOLE-TMP DS 800-160 MG PO TABS
1.0000 | ORAL_TABLET | Freq: Once | ORAL | Status: AC
  Administered 2011-10-31: 1 via ORAL
  Filled 2011-10-31: qty 1

## 2011-10-31 MED ORDER — CEPHALEXIN 500 MG PO CAPS: 500.0000 mg | ORAL_CAPSULE | Freq: Four times a day (QID) | ORAL | Status: AC

## 2011-10-31 MED ORDER — METOCLOPRAMIDE HCL 5 MG/ML IJ SOLN
10.0000 mg | Freq: Once | INTRAMUSCULAR | Status: AC
  Administered 2011-10-31: 10 mg via INTRAVENOUS
  Filled 2011-10-31: qty 2

## 2011-10-31 MED ORDER — SULFAMETHOXAZOLE-TRIMETHOPRIM 800-160 MG PO TABS: 1.0000 | ORAL_TABLET | Freq: Two times a day (BID) | ORAL | Status: AC

## 2011-10-31 MED ORDER — DEXAMETHASONE SODIUM PHOSPHATE 10 MG/ML IJ SOLN
10.0000 mg | Freq: Once | INTRAMUSCULAR | Status: AC
  Administered 2011-10-31: 10 mg via INTRAVENOUS
  Filled 2011-10-31: qty 1

## 2011-10-31 MED ORDER — OXYCODONE-ACETAMINOPHEN 5-325 MG PO TABS: 1.0000 | ORAL_TABLET | Freq: Four times a day (QID) | ORAL | Status: AC | PRN

## 2011-10-31 NOTE — ED Notes (Signed)
Pt c/o right eye swelling and HA. Pt reports she has had some nasal congestion and hasn't been feeling great the past couple of days. Pt is worried about her eye.

## 2011-10-31 NOTE — ED Provider Notes (Signed)
History     CSN: [id number]  Arrival date & time 10/31/11  1459   First MD Initiated Contact with Patient 10/31/11 1912      Chief Complaint  Patient presents with  . Eye Pain  . Fever  . Nasal Congestion    (Consider location/radiation/quality/duration/timing/severity/associated sxs/prior treatment) HPI Comments: Virginia Simmons 36 y.o. female   The chief complaint is: Patient presents with:   Eye Pain   Fever   Nasal Congestion   The patient has medical history significant for:   Past Medical History:   Diabetes mellitus                                            Hypertension                                                 Fibromyalgia                                    lupus        Depression                                                  Patient presents nasal congestion, headache, and R eye swelling. Patient states she was diagnosed with bronchitis on the 25th. She was perscribed amoxicillin but noticed increased swelling of her right eye over the past two days. She reports her headache as 10/10 and mostly frontal. Reports fever and chills. Denies NVD. Reports cough, rhinorrhea, and mild epistaxis.       Patient is a 36 y.o. female presenting with fever. The history is provided by the patient.  Fever Primary symptoms of the febrile illness include fever and cough. Primary symptoms do not include abdominal pain, nausea, vomiting or diarrhea.    Past Medical History  Diagnosis Date  . Diabetes mellitus   . Hypertension   . Fibromyalgia lupus  . Depression     Past Surgical History  Procedure Date  . Hand surgery   . Foot fracture surgery   . Cesarean section 08/30/2011    Procedure: CESAREAN SECTION;  Surgeon: Bing Plume, MD;  Location: [facility];  Service: Gynecology;  Laterality: N/A;    Family History  Problem Relation Age of Onset  . Hypertension Father   . Diabetes Father   . Drug abuse Father   . Bipolar disorder Sister     History    Substance Use Topics  . Smoking status: Current Everyday Smoker -- 0.2 packs/day for 10 years    Types: Cigarettes  . Smokeless tobacco: Never Used  . Alcohol Use: No    OB History    Grav Para Term Preterm Abortions TAB SAB Ect Mult Living   1 1 1  0 0 0 0 0 0 1      Review of Systems  Constitutional: Positive for fever and chills.  HENT: Positive for nosebleeds, congestion, sore throat, rhinorrhea and sinus pressure.   Respiratory: Positive for cough.   Gastrointestinal: Negative  for nausea, vomiting, abdominal pain and diarrhea.  All other systems reviewed and are negative.    Allergies  Review of patient's allergies indicates no known allergies.  Home Medications   Current Outpatient Rx  Name Route Sig Dispense Refill  . ALPRAZOLAM 0.5 MG PO TABS Oral Take 0.5 mg by mouth daily as needed. For anxiety    . ATENOLOL-CHLORTHALIDONE 50-25 MG PO TABS Oral Take 1 tablet by mouth daily.     Marland Kitchen CEFUROXIME AXETIL 250 MG PO TABS Oral Take 250 mg by mouth 2 (two) times daily.    . CYCLOBENZAPRINE HCL 10 MG PO TABS Oral Take 10 mg by mouth daily as needed. For muscle spasms    . FEXOFENADINE HCL 180 MG PO TABS Oral Take 1 tablet (180 mg total) by mouth daily. 30 tablet 1  . FLUTICASONE PROPIONATE 50 MCG/ACT NA SUSP Nasal Place 1 spray into the nose 2 (two) times daily. 1 g 2  . HYDROXYZINE PAMOATE 25 MG PO CAPS Oral Take 25 mg by mouth 2 (two) times daily.    . IBUPROFEN 600 MG PO TABS Oral Take 600 mg by mouth every 6 (six) hours as needed. For pain/fever    . QUETIAPINE FUMARATE 100 MG PO TABS Oral Take 1 tablet (100 mg total) by mouth at bedtime. 30 tablet 1    BP 94/59  Pulse 111  Temp 101.4 F (38.6 C) (Oral)  Resp 24  SpO2 100%  Physical Exam  Nursing note and vitals reviewed. Constitutional: She appears well-developed and well-nourished. She appears distressed.  HENT:  Head: Normocephalic and atraumatic. Head is with right periorbital erythema.    Mouth/Throat:  Oropharynx is clear and moist.  Eyes: Conjunctivae and EOM are normal. Pupils are equal, round, and reactive to light. No scleral icterus.       R eye show significant swelling and edema.  Neck: Normal range of motion. Neck supple.  Cardiovascular: Regular rhythm and normal heart sounds.   Pulmonary/Chest: Effort normal and breath sounds normal.  Abdominal: Soft. Bowel sounds are normal. There is no tenderness.  Lymphadenopathy:    She has cervical adenopathy.  Neurological: She is alert.  Skin: Skin is warm and dry. No rash noted.    ED Course  Procedures (including critical care time)  Labs Reviewed  POCT I-STAT, CHEM 8 - Abnormal; Notable for the following:    Creatinine, Ser 1.60 (*)     Glucose, Bld 123 (*)     Calcium, Ion 1.10 (*)     All other components within normal limits  CBC WITH DIFFERENTIAL   Results for orders placed during the hospital encounter of 10/31/11  CBC WITH DIFFERENTIAL      Component Value Range   WBC 9.4  4.0 - 10.5 K/uL   RBC 4.87  3.87 - 5.11 MIL/uL   Hemoglobin 13.5  12.0 - 15.0 g/dL   HCT 16.1  09.6 - 04.5 %   MCV 86.0  78.0 - 100.0 fL   MCH 27.7  26.0 - 34.0 pg   MCHC 32.2  30.0 - 36.0 g/dL   RDW 40.9  81.1 - 91.4 %   Platelets 316  150 - 400 K/uL   Neutrophils Relative 74  43 - 77 %   Neutro Abs 6.9  1.7 - 7.7 K/uL   Lymphocytes Relative 22  12 - 46 %   Lymphs Abs 2.0  0.7 - 4.0 K/uL   Monocytes Relative 5  3 - 12 %  Monocytes Absolute 0.5  0.1 - 1.0 K/uL   Eosinophils Relative 0  0 - 5 %   Eosinophils Absolute 0.0  0.0 - 0.7 K/uL   Basophils Relative 0  0 - 1 %   Basophils Absolute 0.0  0.0 - 0.1 K/uL  POCT I-STAT, CHEM 8      Component Value Range   Sodium 137  135 - 145 mEq/L   Potassium 3.6  3.5 - 5.1 mEq/L   Chloride 96  96 - 112 mEq/L   BUN 15  6 - 23 mg/dL   Creatinine, Ser 0.98 (*) 0.50 - 1.10 mg/dL   Glucose, Bld 119 (*) 70 - 99 mg/dL   Calcium, Ion 1.47 (*) 1.12 - 1.23 mmol/L   TCO2 29  0 - 100 mmol/L   Hemoglobin  15.0  12.0 - 15.0 g/dL   HCT 82.9  56.2 - 13.0 %    Ct Maxillofacial  Ltd Wo Cm  10/31/2011  *RADIOLOGY REPORT*  Clinical Data:  Sinusitis.  Right eye bulging.  CT LIMITED SINUSES WITHOUT CONTRAST  Technique:  Multidetector CT images of the paranasal sinuses were obtained in a single plane without contrast.  Comparison:  None available.  Findings:  Right periorbital inflammatory changes are present. There is no discrete abscess.  No post-septal changes are present. Intraconal fat and extraocular muscles are within normal limits bilaterally.  The globes are intact.  Limited imaging of the brain is unremarkable.  The paranasal sinuses and mastoid air cells are clear.  IMPRESSION: 1.  Right periorbital inflammatory changes compatible with periorbital cellulitis. 2.  No evidence for post-septal invasion. 3.  No significant sinus disease.   Original Report Authenticated By: Jamesetta Orleans. MATTERN, M.D.      1. Periorbital cellulitis   2. Renal insufficiency       MDM  Patient presented with nasal congestion, headache, rhinorrhea, and R eye swelling. CBC: unremarkable Istat: renal insufficiency most like secondary to diabetic nephropathy or lupus nephritis. CT of sinuses: remarkable for periorbital cellulitis. Patient given headache cocktail in ED. Patient also given first dose of keflex and bactrim in ED. Will discharge on ABX and pain medication with strict return precautions. No red flags for orbital cellulitis.        Pixie Casino, PA-C 10/31/11 2055  Pixie Casino, PA-C 10/31/11 2207

## 2011-10-31 NOTE — ED Notes (Signed)
Pt back from radiology 

## 2011-10-31 NOTE — ED Notes (Signed)
Pt reports was seen on 25th for nasal congestion. Reports right eye discomfort when she was at the doctors. States since then she has had increased swelling to right eye. Pt reports fever and just generalized fatigue. Swelling noted to right eye.

## 2011-10-31 NOTE — ED Provider Notes (Signed)
Medical screening examination/treatment/procedure(s) were performed by non-physician practitioner and as supervising physician I was immediately available for consultation/collaboration.   Gwyneth Sprout, MD 10/31/11 2236

## 2011-11-07 ENCOUNTER — Ambulatory Visit (INDEPENDENT_AMBULATORY_CARE_PROVIDER_SITE_OTHER): Payer: No Typology Code available for payment source | Admitting: Psychology

## 2011-11-07 DIAGNOSIS — F3132 Bipolar disorder, current episode depressed, moderate: Secondary | ICD-10-CM
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
## 2011-11-09 ENCOUNTER — Encounter (HOSPITAL_COMMUNITY): Payer: Self-pay | Admitting: Psychiatry

## 2011-11-09 ENCOUNTER — Ambulatory Visit (INDEPENDENT_AMBULATORY_CARE_PROVIDER_SITE_OTHER): Payer: No Typology Code available for payment source | Admitting: Psychiatry

## 2011-11-09 VITALS — Wt 261.0 lb

## 2011-11-09 DIAGNOSIS — I10 Essential (primary) hypertension: Secondary | ICD-10-CM | POA: Insufficient documentation

## 2011-11-09 DIAGNOSIS — F319 Bipolar disorder, unspecified: Secondary | ICD-10-CM

## 2011-11-09 MED ORDER — ESCITALOPRAM OXALATE 5 MG PO TABS: 5.0000 mg | ORAL_TABLET | Freq: Every day | ORAL | Status: DC

## 2011-11-09 MED ORDER — QUETIAPINE FUMARATE 100 MG PO TABS: 100.0000 mg | ORAL_TABLET | Freq: Every day | ORAL | Status: DC
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
## 2011-11-14 ENCOUNTER — Ambulatory Visit (INDEPENDENT_AMBULATORY_CARE_PROVIDER_SITE_OTHER): Payer: 59 | Admitting: Psychology

## 2011-11-14 DIAGNOSIS — F3130 Bipolar disorder, current episode depressed, mild or moderate severity, unspecified: Secondary | ICD-10-CM | POA: Insufficient documentation

## 2011-11-14 NOTE — Progress Notes (Signed)
   THERAPIST PROGRESS NOTE  Session Time: 1:25pm-2:15pm  Participation Level: Active  Behavioral Response: Well GroomedAlertDepressed and Irritable  Type of Therapy: Individual Therapy  Treatment Goals addressed: Diagnosis: Bipolar 1 D/O most recent depressed and goal 1 & 3.  Interventions: CBT and Strength-based  Summary: Virginia Simmons is a 36 y.o. female who presents with reports of depressed moods that have improved since last week.  Pt reports that she has been getting up and less anhedonia.  Pt reports she has been focusing on making healthy choices for self and daughter.  Pt is irritable as she discusses recent contacts from her ex/child's father and continued lack of support and emotional putting down.  Pt reports she is keeping good boundaries but recognizes need to not engage in arguing w/ him.  Pt discusses what she is thankful for and giving thanks to God.  Suicidal/Homicidal: Nowithout intent/plan  Therapist Response: Assessed pt current functioning per pt report.  Explored w/pt improvements since last week w/ more motivation and encouraged continued use of support system, positive self talk and positive self care.  processed w/pt her interactions w/ her ex and reflected that he is attempting to reengage her in previous negative interactions.  Encouraged continued boundaries.  Plan: Return again in 1 weeks.  Diagnosis: Axis I: Bipolar, Depressed    Axis II: No diagnosis    Bran Aldridge, LPC 11/14/2011

## 2011-11-21 ENCOUNTER — Ambulatory Visit (INDEPENDENT_AMBULATORY_CARE_PROVIDER_SITE_OTHER): Payer: 59 | Admitting: Psychology

## 2011-11-21 DIAGNOSIS — F3130 Bipolar disorder, current episode depressed, mild or moderate severity, unspecified: Secondary | ICD-10-CM

## 2011-11-21 NOTE — Progress Notes (Signed)
   THERAPIST PROGRESS NOTE  Session Time: 11.03am-11:43am  Participation Level: Active  Behavioral Response: Well GroomedAlertDepressed  Type of Therapy: Individual Therapy  Treatment Goals addressed: Diagnosis: Bipolar 1 d/o, depressed and goal 1&3.  Interventions: CBT, Strength-based and Reframing  Summary: Virginia Simmons is a 36 y.o. female who presents with depressed mood and affect.  Pt reports that she is feeling very discouraged over the past couple of days w/ trying to look for employment.  Pt reported that she has wanted to withdraw and not do anything but has been able to still get up and is getting things done.  Pt also reported that her child's father/ex came over on the weekend to visit her daughter.  She reported he attempted to stay w/ her but she set the boundary and became aware that he was seeking to stay after causing some trouble in another relationship pt did report she asked him to leave.  Pt increased awareness that w/ continuing to talk w/ him when he calls she is inviting him into her life.  Pt discussed need to not be around hurtful people and agreed to focus on being around those who are well for her.  Pt reported she is going to unemployment today and feels encouragement w/ her Virginia Simmons.  Suicidal/Homicidal: Nowithout intent/plan  Therapist Response: Assessed pt current functioning per pt report.  Processed w/pt contributing factors to depressed mood and reframing pt expectations of job search process.  Also discussed interactions w/ exboyfriend and effect having on her and encourage need to be around those her are good for her and ok to set those boundaries w/ those who aren't.  Plan: Return again in 1 weeks.  Diagnosis: Axis I: Bipolar, Depressed    Axis II: No diagnosis    Nonie Lochner, LPC 11/21/2011

## 2011-11-27 ENCOUNTER — Ambulatory Visit (INDEPENDENT_AMBULATORY_CARE_PROVIDER_SITE_OTHER): Payer: 59 | Admitting: Psychology

## 2011-11-27 DIAGNOSIS — F3130 Bipolar disorder, current episode depressed, mild or moderate severity, unspecified: Secondary | ICD-10-CM
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
## 2011-11-28 ENCOUNTER — Encounter (HOSPITAL_COMMUNITY): Payer: Self-pay | Admitting: Psychiatry

## 2011-11-28 ENCOUNTER — Ambulatory Visit (INDEPENDENT_AMBULATORY_CARE_PROVIDER_SITE_OTHER): Payer: 59 | Admitting: Psychiatry

## 2011-11-28 VITALS — BP 127/93 | HR 101 | Wt 272.8 lb

## 2011-11-28 DIAGNOSIS — F319 Bipolar disorder, unspecified: Secondary | ICD-10-CM

## 2011-11-28 MED ORDER — LOXAPINE SUCCINATE 10 MG PO CAPS: ORAL_CAPSULE | ORAL | Status: DC
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
## 2011-12-07 ENCOUNTER — Ambulatory Visit (INDEPENDENT_AMBULATORY_CARE_PROVIDER_SITE_OTHER): Payer: 59 | Admitting: Psychology

## 2011-12-07 DIAGNOSIS — F3130 Bipolar disorder, current episode depressed, mild or moderate severity, unspecified: Secondary | ICD-10-CM
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
## 2011-12-12 ENCOUNTER — Ambulatory Visit (INDEPENDENT_AMBULATORY_CARE_PROVIDER_SITE_OTHER): Payer: 59 | Admitting: Psychology

## 2011-12-12 DIAGNOSIS — F3130 Bipolar disorder, current episode depressed, mild or moderate severity, unspecified: Secondary | ICD-10-CM

## 2011-12-12 NOTE — Progress Notes (Signed)
   THERAPIST PROGRESS NOTE  Session Time: 11.00am-11.42am  Participation Level: Active  Behavioral Response: Well GroomedAlertDepressed  Type of Therapy: Individual Therapy  Treatment Goals addressed: Diagnosis: Bipolar and goal 1&3.  Interventions: CBT, Strength-based and Supportive  Summary: Virginia Simmons is a 36 y.o. female who presents with depressed affect and mood- reporting that she is fatigued today and poor sleep last night.  Pt reports that she maintains social interactions w/ positive supports and attempting to limit interactions w/ her ex.  Pt reports she just feels tired a lot.  Pt reports putting in 5 applications recently.  Pt discussed interactions w/ ex over the phone and being more assertive in her responses. .   Suicidal/Homicidal: Nowithout intent/plan  Therapist Response: Assessed pt current funcitoning per pt report.  Processed w/pt fatigue and contributing factors- including infant she is caring for.  Encouraged continued positive support interactions and boundaries w/ negative interactions.  Reflected pt assertiveness and encouraged further assertive boundaries.  Plan: Return again in 1 weeks.  Diagnosis: Axis I: Bipolar, Depressed    Axis II: No diagnosis    Taite Schoeppner, LPC 12/12/2011

## 2011-12-19 ENCOUNTER — Encounter (HOSPITAL_COMMUNITY): Payer: Self-pay | Admitting: Psychiatry

## 2011-12-19 ENCOUNTER — Ambulatory Visit (INDEPENDENT_AMBULATORY_CARE_PROVIDER_SITE_OTHER): Payer: 59 | Admitting: Psychiatry

## 2011-12-19 VITALS — BP 126/98 | HR 95 | Wt 280.8 lb

## 2011-12-19 DIAGNOSIS — F3189 Other bipolar disorder: Secondary | ICD-10-CM

## 2011-12-19 DIAGNOSIS — F319 Bipolar disorder, unspecified: Secondary | ICD-10-CM

## 2011-12-19 MED ORDER — LOXAPINE SUCCINATE 25 MG PO CAPS: 25.0000 mg | ORAL_CAPSULE | Freq: Every day | ORAL | Status: DC

## 2011-12-19 NOTE — Progress Notes (Signed)
[facility] [address] Progress Note  Virginia Simmons [id number] 36 y.o.  12/19/2011 2:18 PM  Chief Complaint: I'm doing better.  I like Loxitane however I need to capsule every night.  I still feel some time paranoia and sleeping better.  I'm looking for a job and last week I applied 5 places.  However I get disappointed when I don't see any results.  History of Present Illness: Suicidal Ideation: No Plan Formed: No Patient has means to carry out plan: No  Homicidal Ideation: No Plan Formed: No Patient has means to carry out plan: No  Review of Systems: Psychiatric: Agitation: No Hallucination: Yes Depressed Mood: Yes Insomnia: No Hypersomnia: No Altered Concentration: No Feels Worthless: No Grandiose Ideas: No Belief In Special Powers: No New/Increased Substance Abuse: No Compulsions: No  Neurologic: Headache: Yes Seizure: No Paresthesias: No  Past Medical Family, Social History: Patient has history of diabetes, fibromyalgia, lupus, hypertension and obesity.  Patient does not take her antihypertensive medication.  Her OB/GYN is Dr. Tracey Simmons and her primary care physician is Dr. Ferdinand Simmons at [facility].  Outpatient Encounter Prescriptions as of 12/19/2011  Medication Sig Dispense Refill  . ALPRAZolam (XANAX) 0.5 MG tablet Take 0.5 mg by mouth daily as needed. For anxiety      . atenolol-chlorthalidone (TENORETIC) 50-25 MG per tablet Take 1 tablet by mouth daily.       . cyclobenzaprine (FLEXERIL) 10 MG tablet Take 10 mg by mouth daily as needed. For muscle spasms      . fexofenadine (ALLEGRA) 180 MG tablet Take 1 tablet (180 mg total) by mouth daily.  30 tablet  1  . fluticasone (FLONASE) 50 MCG/ACT nasal spray Place 1 spray into the nose 2 (two) times daily.  1 g  2  . ibuprofen (ADVIL,MOTRIN) 600 MG tablet Take 600 mg by mouth every 6 (six) hours as needed. For pain/fever      . loxapine (LOXITANE) 10 MG capsule Take 1-2 Capsule at bed time.  30  capsule  0  . oxyCODONE-acetaminophen (PERCOCET) 10-325 MG per tablet         Past Psychiatric History/Hospitalization(s): Anxiety: Yes Bipolar Disorder: Yes Depression: Yes Mania: No Psychosis: Yes Schizophrenia: No Personality Disorder: No Hospitalization for psychiatric illness: No History of Electroconvulsive Shock Therapy: No Prior Suicide Attempts: Yes  Physical Exam: Constitutional:  BP 126/98 Pulse 95 Wt 280lbs  General Appearance: obese  Musculoskeletal: Strength & Muscle Tone: within normal limits Gait & Station: normal Patient leans: N/A  Psychiatric: Speech (describe rate, volume, coherence, spontaneity, and abnormalities if any): Slow but spontaneous but decreased volume and tone.  Thought Process (describe rate, content, abstract reasoning, and computation): Slow but logical and goal-directed.  Associations: Circumstantial  Thoughts: hallucinations but additional paranoia but no command auditory hallucination.  Mental Status: Orientation: oriented to time/date Mood & Affect: depressed affect Attention Span & Concentration: Fair.  Medical Decision Making (Choose Three): Established Problem, Stable/Improving (1), Review of Psycho-Social Stressors (1), Review of Medication Regimen & Side Effects (2) and Review of New Medication or Change in Dosage (2)  Assessment: Axis I: Bipolar disorder with psychotic feature.  Axis II: Deferred  Axis III: Obesity, diabetes, lupus, fibromyalgia and hypertension  Axis IV: Mild to moderate  Axis V: 60-65   Plan: I review her psychosocial stressors, patient has shown some improvement with Loxitane however she continued to have some residual paranoia and hallucination.  She appears tired.  She is trying to get job.  Her 55-month-old baby is taking care by her mother.  Patient does not have any other support system.  She get help from her mother.  She start taking some medication for her chronic pain and lupus.  I  recommend to try Loxitane 25 mg at bedtime.  I explained risks and benefits of medication.  At this time patient does not have any tremors or shakes.  I recommend to call us if she is any question or concern about the medication if she feels worsening of the symptom.  She is seeing therapist for coping and social skills.  More than 50% of the time spent in counseling, psychoeducation and coordination of care.  I will see her again in 6 weeks.  Virginia Simmons T., MD 12/19/2011

## 2011-12-25 ENCOUNTER — Telehealth (HOSPITAL_COMMUNITY): Payer: Self-pay | Admitting: *Deleted

## 2011-12-25 NOTE — Telephone Encounter (Signed)
Pt left long VM: Unemployment and food stamps have run out. She is out of resources. She went to [employer] counseling. Was asked if she had any medical/emotional problems that caused her to be out of work, or lose her job. She states she was taken out of work by [employer] in March (she thinks) due to emotional stress.  Now she needs a letter from him to explain that, so she can take it to them.

## 2011-12-26 ENCOUNTER — Other Ambulatory Visit (HOSPITAL_COMMUNITY): Payer: Self-pay | Admitting: Psychiatry

## 2011-12-26 NOTE — Telephone Encounter (Signed)
See phone note

## 2011-12-28 ENCOUNTER — Ambulatory Visit (INDEPENDENT_AMBULATORY_CARE_PROVIDER_SITE_OTHER): Payer: 59 | Admitting: Psychology

## 2011-12-28 DIAGNOSIS — F3130 Bipolar disorder, current episode depressed, mild or moderate severity, unspecified: Secondary | ICD-10-CM
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
## 2011-12-31 ENCOUNTER — Telehealth (HOSPITAL_COMMUNITY): Payer: Self-pay | Admitting: *Deleted

## 2011-12-31 NOTE — Telephone Encounter (Signed)
See phone notes.

## 2012-01-09 ENCOUNTER — Ambulatory Visit (HOSPITAL_COMMUNITY): Payer: Self-pay | Admitting: Psychology

## 2012-01-18 ENCOUNTER — Ambulatory Visit (INDEPENDENT_AMBULATORY_CARE_PROVIDER_SITE_OTHER): Payer: 59 | Admitting: Psychology

## 2012-01-18 DIAGNOSIS — F3130 Bipolar disorder, current episode depressed, mild or moderate severity, unspecified: Secondary | ICD-10-CM

## 2012-01-18 NOTE — Progress Notes (Signed)
   THERAPIST PROGRESS NOTE  Session Time: 12.35pm-1:18pm  Participation Level: Active  Behavioral Response: Well GroomedAlert, Affect Bright  Type of Therapy: Individual Therapy  Treatment Goals addressed: Diagnosis: Bipolar 1 D/O and goal 2.   Interventions: CBT and Strength-based  Summary: Virginia Simmons is a 36 y.o. female who presents with full and bright affect. Pt reported she had forgotten about appointment last week w/out the reminder call. Pt reported she feels good to come in today as needing to have someone to talk to.  Pt reported she is still stressed financially w/ no job and no unemployment.  Pt reported that she did go for job interview yesterday, was asked if she wanted the job and reported need to pass drug test.  Pt reported she completed drug test today and expects to pass as no use of drugs.  Pt feels encouraged by this prospect. Pt also spoke very positively about herself and caring for her daughter.  Pt reported last contact w/ child's father on 01-06-12 as he called from jail and pt reported he attempted to guilt but she was assertive and set limits.  Pt good insight about needing to be around the people who are good influence and are goal directed in their lives.   Suicidal/Homicidal: Nowithout intent/plan  Therapist Response: Assessed pt current functioning per pt report. Processed w/pt stressors and reflected pt strengths and how she has continued taking appropriate steps towards employment goals.  Processed w/ pt interactions w/ peers and family and reiterated surrounding herself w/ those who are positive.  Plan: Return again in 1 weeks.  Diagnosis: Axis I: Bipolar 1 d/o    Axis II: No diagnosis    YATES,LEANNE, LPC 01/18/2012

## 2012-01-20 ENCOUNTER — Emergency Department (HOSPITAL_COMMUNITY)
Admission: EM | Admit: 2012-01-20 | Discharge: 2012-01-20 | Disposition: A | Discharge status: Home / Self Care | Payer: 59 | Source: Home / Self Care | Attending: Emergency Medicine | Admitting: Emergency Medicine

## 2012-01-20 ENCOUNTER — Encounter (HOSPITAL_COMMUNITY): Payer: Self-pay | Admitting: Emergency Medicine

## 2012-01-20 DIAGNOSIS — T148XXA Other injury of unspecified body region, initial encounter: Secondary | ICD-10-CM

## 2012-01-20 DIAGNOSIS — Z3490 Encounter for supervision of normal pregnancy, unspecified, unspecified trimester: Secondary | ICD-10-CM

## 2012-01-20 LAB — POCT PREGNANCY, URINE: Preg Test, Ur: POSITIVE — AB

## 2012-01-20 MED ORDER — IBUPROFEN 800 MG PO TABS: 800.0000 mg | ORAL_TABLET | Freq: Four times a day (QID) | ORAL | Status: DC | PRN

## 2012-01-20 MED ORDER — CYCLOBENZAPRINE HCL 10 MG PO TABS: 10.0000 mg | ORAL_TABLET | Freq: Two times a day (BID) | ORAL | Status: DC | PRN

## 2012-01-20 NOTE — ED Notes (Signed)
Reports a fall today .  Patient thinks the floor was wet.  Fell on left side, hitting left knee.  Reports pain in left knee and left buttocks/hip.

## 2012-01-20 NOTE — ED Provider Notes (Signed)
History     CSN: [id number]  Arrival date & time 01/20/12  1623   First MD Initiated Contact with Patient 01/20/12 1943      Chief Complaint  Patient presents with  . Fall    (Consider location/radiation/quality/duration/timing/severity/associated sxs/prior treatment) HPI Comments: Pt slipped on [place] at [employer] today, injuring L knee and hip.  Also is concerned she might be pregnant because feels "achy" like did with previous pregnancy; does have nexplanon implant.   Patient is a 36 y.o. female presenting with fall. The history is provided by the patient.  Fall The accident occurred 6 to 12 hours ago. The fall occurred while walking. Distance fallen: from standing. She landed on a hard floor. The point of impact was the left knee and left hip. The pain is present in the left hip and left knee. The pain is at a severity of 10/10. She was ambulatory at the scene. There was no entrapment after the fall. There was no drug use involved in the accident. There was no alcohol use involved in the accident. Pertinent negatives include no numbness, no abdominal pain, no nausea, no loss of consciousness and no tingling. The symptoms are aggravated by ambulation and standing. She has tried nothing for the symptoms.    Past Medical History  Diagnosis Date  . Diabetes mellitus   . Hypertension   . Fibromyalgia lupus  . Depression     Past Surgical History  Procedure Date  . Hand surgery   . Foot fracture surgery   . Cesarean section 08/30/2011    Procedure: CESAREAN SECTION;  Surgeon: Bing Plume, MD;  Location: [facility];  Service: Gynecology;  Laterality: N/A;  . Foot surgery     left   . Hand surgery     left  . Cesarean section     Family History  Problem Relation Age of Onset  . Hypertension Father   . Diabetes Father   . Drug abuse Father   . Bipolar disorder Sister     History  Substance Use Topics  . Smoking status: Current Every Day Smoker -- 0.2 packs/day for  10 years    Types: Cigarettes  . Smokeless tobacco: Never Used  . Alcohol Use: No    OB History    Grav Para Term Preterm Abortions TAB SAB Ect Mult Living   1 1 1  0 0 0 0 0 0 1      Review of Systems  Gastrointestinal: Negative for nausea and abdominal pain.  Musculoskeletal: Positive for myalgias.       L knee and hip pain  Skin: Negative for color change and wound.  Neurological: Negative for tingling, loss of consciousness and numbness.    Allergies  Review of patient's allergies indicates no known allergies.  Home Medications   Current Outpatient Rx  Name  Route  Sig  Dispense  Refill  . ALPRAZOLAM 0.5 MG PO TABS   Oral   Take 0.5 mg by mouth daily as needed. For anxiety         . ATENOLOL-CHLORTHALIDONE 50-25 MG PO TABS   Oral   Take 1 tablet by mouth daily.          . CYCLOBENZAPRINE HCL 10 MG PO TABS   Oral   Take 10 mg by mouth daily as needed. For muscle spasms         . CYCLOBENZAPRINE HCL 10 MG PO TABS   Oral   Take 1 tablet (10  mg total) by mouth 2 (two) times daily as needed for muscle spasms.   20 tablet   0   . FEXOFENADINE HCL 180 MG PO TABS   Oral   Take 1 tablet (180 mg total) by mouth daily.   30 tablet   1   . FLUTICASONE PROPIONATE 50 MCG/ACT NA SUSP   Nasal   Place 1 spray into the nose 2 (two) times daily.   1 g   2   . IBUPROFEN 600 MG PO TABS   Oral   Take 600 mg by mouth every 6 (six) hours as needed. For pain/fever         . IBUPROFEN 800 MG PO TABS   Oral   Take 1 tablet (800 mg total) by mouth every 6 (six) hours as needed for pain.   30 tablet   0   . LOXAPINE SUCCINATE 25 MG PO CAPS   Oral   Take 1 capsule (25 mg total) by mouth at bedtime.   30 capsule   1   . OXYCODONE-ACETAMINOPHEN 10-325 MG PO TABS                 BP 137/69  Pulse 79  Temp 98.8 F (37.1 C) (Oral)  Resp 17  SpO2 100%  Breastfeeding? Unknown  Physical Exam  Constitutional: She is oriented to person, place, and time. She  appears well-developed and well-nourished. No distress.       Obese  Musculoskeletal:       Left hip: She exhibits tenderness. She exhibits no bony tenderness, no swelling and no deformity.       Left knee: She exhibits normal range of motion, no swelling, no ecchymosis, no deformity, no erythema, normal alignment, no LCL laxity, normal patellar mobility and no MCL laxity. tenderness found.  Neurological: She is alert and oriented to person, place, and time. Gait normal.  Skin: Skin is warm, dry and intact. No abrasion and no bruising noted.    ED Course  Procedures (including critical care time)  Labs Reviewed  POCT PREGNANCY, URINE - Abnormal; Notable for the following:    Preg Test, Ur POSITIVE (*)     All other components within normal limits   No results found.   1. Contusion   2. Pregnancy       MDM          Cathlyn Parsons, NP 01/20/12 2115  Cathlyn Parsons, NP 01/20/12 2116

## 2012-01-20 NOTE — ED Provider Notes (Signed)
Medical screening examination/treatment/procedure(s) were performed by non-physician practitioner and as supervising physician I was immediately available for consultation/collaboration.  Zyir Gassert, M.D.   Brunella Wileman C Sewell Pitner, MD 01/20/12 2136 

## 2012-01-22 ENCOUNTER — Encounter (HOSPITAL_COMMUNITY): Payer: Self-pay

## 2012-01-22 ENCOUNTER — Other Ambulatory Visit: Payer: Self-pay | Admitting: Obstetrics and Gynecology

## 2012-01-22 ENCOUNTER — Ambulatory Visit (HOSPITAL_COMMUNITY)
Admission: RE | Admit: 2012-01-22 | Discharge: 2012-01-22 | Disposition: A | Discharge status: Home / Self Care | Payer: Medicaid Other | Source: Ambulatory Visit | Attending: Obstetrics and Gynecology | Admitting: Obstetrics and Gynecology

## 2012-01-22 ENCOUNTER — Other Ambulatory Visit (HOSPITAL_COMMUNITY): Payer: Self-pay | Admitting: Obstetrics and Gynecology

## 2012-01-22 DIAGNOSIS — Z975 Presence of (intrauterine) contraceptive device: Secondary | ICD-10-CM | POA: Insufficient documentation

## 2012-01-22 DIAGNOSIS — Z3490 Encounter for supervision of normal pregnancy, unspecified, unspecified trimester: Secondary | ICD-10-CM

## 2012-01-28 ENCOUNTER — Ambulatory Visit (HOSPITAL_COMMUNITY)
Admission: RE | Admit: 2012-01-28 | Discharge: 2012-01-28 | Disposition: A | Discharge status: Home / Self Care | Payer: Medicaid Other | Source: Ambulatory Visit | Attending: Obstetrics and Gynecology | Admitting: Obstetrics and Gynecology

## 2012-01-28 ENCOUNTER — Other Ambulatory Visit (HOSPITAL_COMMUNITY): Payer: Self-pay | Admitting: Obstetrics and Gynecology

## 2012-01-28 DIAGNOSIS — Z975 Presence of (intrauterine) contraceptive device: Secondary | ICD-10-CM

## 2012-01-29 ENCOUNTER — Telehealth (HOSPITAL_COMMUNITY): Payer: Self-pay | Admitting: Psychology

## 2012-01-29 ENCOUNTER — Ambulatory Visit (HOSPITAL_COMMUNITY): Payer: Self-pay | Admitting: Psychology

## 2012-01-29 NOTE — Telephone Encounter (Signed)
Pt didn't show for 10am appointment.  Counselor called to f/u about appointment.  Pt reported that she received a call informing about appointment tomorrow- so confused today's appointment w/ Dr. Lolly Mustache appointment tomorrow.  Pt reported she wants to reschedule.  Pt informed she was doing ok- but a little down.  She reported on 01/25/12 having an abortion- as she discovered she was [redacted] weeks pregnant and w/ Implanon in and medications taking there was concern about potential birth defects.  Pt reported that she feels this was the best decision.  Pt did report that she did get the job at [employer] and is excited to start.  Pt was transferred to front office staff to schedule for f/u appointment.

## 2012-01-30 ENCOUNTER — Ambulatory Visit (HOSPITAL_COMMUNITY): Payer: Self-pay | Admitting: Psychiatry

## 2012-02-06 ENCOUNTER — Ambulatory Visit (HOSPITAL_COMMUNITY): Payer: Self-pay | Admitting: Psychiatry

## 2012-02-08 ENCOUNTER — Encounter (HOSPITAL_COMMUNITY): Payer: Self-pay

## 2012-02-14 ENCOUNTER — Inpatient Hospital Stay (HOSPITAL_COMMUNITY): Payer: Medicaid Other

## 2012-02-14 ENCOUNTER — Encounter (HOSPITAL_COMMUNITY): Payer: Self-pay | Admitting: *Deleted

## 2012-02-14 ENCOUNTER — Inpatient Hospital Stay (HOSPITAL_COMMUNITY)
Admission: AD | Admit: 2012-02-14 | Discharge: 2012-02-14 | Disposition: A | Discharge status: Home / Self Care | Payer: Medicaid Other | Source: Ambulatory Visit | Attending: Obstetrics and Gynecology | Admitting: Obstetrics and Gynecology

## 2012-02-14 DIAGNOSIS — O034 Incomplete spontaneous abortion without complication: Secondary | ICD-10-CM

## 2012-02-14 DIAGNOSIS — O071 Delayed or excessive hemorrhage following failed attempted termination of pregnancy: Secondary | ICD-10-CM | POA: Insufficient documentation

## 2012-02-14 DIAGNOSIS — O039 Complete or unspecified spontaneous abortion without complication: Secondary | ICD-10-CM

## 2012-02-14 LAB — CBC
Hemoglobin: 11.5 g/dL — ABNORMAL LOW (ref 12.0–15.0)
MCHC: 31.7 g/dL (ref 30.0–36.0)
Platelets: 229 10*3/uL (ref 150–400)
RBC: 4.28 MIL/uL (ref 3.87–5.11)

## 2012-02-14 LAB — HCG, SERUM, QUALITATIVE: Preg, Serum: POSITIVE — AB

## 2012-02-14 MED ORDER — IBUPROFEN 600 MG PO TABS: 600.0000 mg | ORAL_TABLET | Freq: Four times a day (QID) | ORAL | Status: DC | PRN

## 2012-02-14 MED ORDER — IBUPROFEN 800 MG PO TABS
800.0000 mg | ORAL_TABLET | Freq: Four times a day (QID) | ORAL | Status: DC | PRN
  Administered 2012-02-14: 800 mg via ORAL
  Filled 2012-02-14: qty 1

## 2012-02-14 MED ORDER — OXYCODONE-ACETAMINOPHEN 5-325 MG PO TABS: 1.0000 | ORAL_TABLET | ORAL | Status: DC | PRN

## 2012-02-14 NOTE — Progress Notes (Signed)
Pt states she has medication for depression  That she does not take

## 2012-02-14 NOTE — MAU Provider Note (Addendum)
History     CSN: [id number]  Arrival date and time: 02/14/12 1818   First Provider Initiated Contact with Patient 02/14/12 1903      Chief Complaint  Patient presents with  . Vaginal Bleeding   HPI Pt is 2 weeks post TAB at [employer] and presents with heavy bleeding.  Pt is not cramping. At this time and had abdominal cramping this morning like she is having contractions.  She sneezed and felt like something dropped out and she started bleeding and clots.  She had a Nexplanon but it got lost.  She is to see Dr. Ambrose Mantle tomorrow and to have ?Essure. ( Pt is on the phone as I am trying to obtain her history)  Pt is bipolar. Pt's LMP was end of Sept.    Past Medical History  Diagnosis Date  . Diabetes mellitus   . Hypertension   . Fibromyalgia lupus  . Depression     Past Surgical History  Procedure Date  . Hand surgery   . Foot fracture surgery   . Cesarean section 08/30/2011    Procedure: CESAREAN SECTION;  Surgeon: Bing Plume, MD;  Location: [facility];  Service: Gynecology;  Laterality: N/A;  . Foot surgery     left   . Hand surgery     left  . Cesarean section     Family History  Problem Relation Age of Onset  . Hypertension Father   . Diabetes Father   . Drug abuse Father   . Bipolar disorder Sister     History  Substance Use Topics  . Smoking status: Current Every Day Smoker -- 0.2 packs/day for 10 years    Types: Cigarettes  . Smokeless tobacco: Never Used  . Alcohol Use: No    Allergies: No Known Allergies  Prescriptions prior to admission  Medication Sig Dispense Refill  . ALPRAZolam (XANAX) 0.5 MG tablet Take 0.5 mg by mouth daily as needed. For anxiety      . atenolol-chlorthalidone (TENORETIC) 50-25 MG per tablet Take 1 tablet by mouth daily.       . cyclobenzaprine (FLEXERIL) 10 MG tablet Take 10 mg by mouth daily as needed. For muscle spasms      . cyclobenzaprine (FLEXERIL) 10 MG tablet Take 1 tablet (10 mg total) by mouth 2 (two) times  daily as needed for muscle spasms.  20 tablet  0  . fexofenadine (ALLEGRA) 180 MG tablet Take 1 tablet (180 mg total) by mouth daily.  30 tablet  1  . fluticasone (FLONASE) 50 MCG/ACT nasal spray Place 1 spray into the nose 2 (two) times daily.  1 g  2  . ibuprofen (ADVIL,MOTRIN) 600 MG tablet Take 600 mg by mouth every 6 (six) hours as needed. For pain/fever      . ibuprofen (ADVIL,MOTRIN) 800 MG tablet Take 1 tablet (800 mg total) by mouth every 6 (six) hours as needed for pain.  30 tablet  0  . loxapine (LOXITANE) 25 MG capsule Take 1 capsule (25 mg total) by mouth at bedtime.  30 capsule  1  . oxyCODONE-acetaminophen (PERCOCET) 10-325 MG per tablet         Review of Systems  Constitutional: Negative for fever and chills.  Gastrointestinal: Positive for abdominal pain. Negative for nausea and vomiting.  Genitourinary: Negative for dysuria.  Neurological: Negative for dizziness.   Physical Exam   Blood pressure 119/78, pulse 90, temperature 98.4 F (36.9 C), temperature source Oral, resp. rate 18, last  menstrual period 12/15/2010, SpO2 100.00%, unknown if currently breastfeeding.  Physical Exam  Vitals reviewed. Constitutional: She is oriented to person, place, and time. She appears well-developed and well-nourished.  HENT:  Head: Normocephalic.  Eyes: Pupils are equal, round, and reactive to light.  Neck: Normal range of motion. Neck supple.  Cardiovascular: Normal rate.   Respiratory: Effort normal.  GI: Soft. She exhibits no distension. There is no tenderness.  Genitourinary:       Mod-large amount of blood from cervix; cervix closed;   Musculoskeletal: Normal range of motion.  Neurological: She is alert and oriented to person, place, and time.  Skin: Skin is warm and dry.    MAU Course  Procedures CBC HCG-Qualitative [place] Dr. Senaida Ores paged Care turned over to Deeann Cree, CNM   Assessment and Plan    LINEBERRY,SUSAN  Addendum: 2130: Saw pt after [place]  S: No  orthostatic sx O: Small amount blood on peripad.  Orthostatic VS done: no significant changes B pos blood type  Results for orders placed during the hospital encounter of 02/14/12 (from the past 24 hour(s))  CBC     Status: Abnormal   Collection Time   02/14/12  7:30 PM      Component Value Range   WBC 5.1  4.0 - 10.5 K/uL   RBC 4.28  3.87 - 5.11 MIL/uL   Hemoglobin 11.5 (*) 12.0 - 15.0 g/dL   HCT 16.1  09.6 - 04.5 %   MCV 84.8  78.0 - 100.0 fL   MCH 26.9  26.0 - 34.0 pg   MCHC 31.7  30.0 - 36.0 g/dL   RDW 40.9  81.1 - 91.4 %   Platelets 229  150 - 400 K/uL  HCG, SERUM, QUALITATIVE     Status: Abnormal   Collection Time   02/14/12  7:30 PM      Component Value Range   Preg, Serum POSITIVE (*) NEGATIVE   Filed Vitals:   02/14/12 1959  BP: 125/83  Pulse: 103  Temp:   Resp:    *RADIOLOGY REPORT*  Clinical Data: Heavy bleeding post therapeutic abortion on  01/25/2012 of 14 weeks.  TRANSABDOMINAL AND TRANSVAGINAL ULTRASOUND OF PELVIS  Technique: Both transabdominal and transvaginal ultrasound  examinations of the pelvis were performed. Transabdominal technique  was performed for global imaging of the pelvis including uterus,  ovaries, adnexal regions, and pelvic cul-de-sac.  It was necessary to proceed with endovaginal exam following the  transabdominal exam to visualize the endometrium.  Comparison: None  Findings:  Uterus: The uterus is anteverted and measures 12.2 x 5.6 x 7.4 cm.  No myometrial mass lesions are identified.  Endometrium: Heterogeneous and diffusely expanded endometrium with  mixed echogenic material and fluid. Color flow Doppler images  demonstrate flow within the endometrium. Changes suggest retained  products of conception.  Right ovary: Right ovary measures 3.7 x 2.2 x 3 cm. No abnormal  adnexal masses. Right ovary is not visualized transvaginally.  Left ovary: Left ovary measures 5.5 x 2.9 x 2.7 cm. No abnormal  adnexal masses. Left ovary was not  visualized transvaginally.  Other findings: No free pelvic fluid collections.  IMPRESSION:  Prominent expansion of the endometrium with heterogeneous mixed  echogenic material. Flow is demonstrated within the endometrial  canal. Changes consistent with retained products of conception.  Original   02/14/2012, 7:03 PM   A: 36 yo [id number] 2 wks s/p EAB withretained products of conception Hemodynamically stable  P: C/W Dr. Senaida Ores Offered cytotec 600  mcg pv or expectant management until office visit tomorrow with Dr. Ambrose Mantle Pt elects cytotec.

## 2012-02-14 NOTE — Progress Notes (Signed)
Pt states she started having diarrhea early this morning

## 2012-02-14 NOTE — MAU Note (Signed)
Moderate to heavy bleeding noted. Pt given paper scrubs to wear home. Pt alert and oriented. Prescriptions for additional pain medication provided and instructions to follow up with her private MD. Pt states she already has an appointment with Dr Ambrose Mantle for a "Tubal" in the office tomorrow.

## 2012-02-14 NOTE — Progress Notes (Signed)
Pt states she is not in a relationship

## 2012-02-14 NOTE — Progress Notes (Signed)
Large amount of vaginal bleeding noted. Large clot x1 golf ball size expelled.

## 2012-02-14 NOTE — MAU Note (Signed)
Pt states she had a large amount of bleeding when at working

## 2012-02-15 ENCOUNTER — Encounter (HOSPITAL_COMMUNITY): Payer: Self-pay

## 2012-02-15 ENCOUNTER — Ambulatory Visit (HOSPITAL_COMMUNITY): Payer: No Typology Code available for payment source | Admitting: Psychology

## 2012-03-12 ENCOUNTER — Encounter (HOSPITAL_COMMUNITY): Payer: Self-pay | Admitting: Psychology

## 2012-04-01 ENCOUNTER — Emergency Department (HOSPITAL_COMMUNITY)
Admission: EM | Admit: 2012-04-01 | Discharge: 2012-04-01 | Disposition: A | Discharge status: Home / Self Care | Payer: Medicaid Other | Source: Home / Self Care | Attending: Family Medicine | Admitting: Family Medicine

## 2012-04-01 ENCOUNTER — Encounter (HOSPITAL_COMMUNITY): Payer: Self-pay | Admitting: *Deleted

## 2012-04-01 DIAGNOSIS — J111 Influenza due to unidentified influenza virus with other respiratory manifestations: Secondary | ICD-10-CM

## 2012-04-01 LAB — POCT RAPID STREP A: Streptococcus, Group A Screen (Direct): NEGATIVE

## 2012-04-01 MED ORDER — IBUPROFEN 800 MG PO TABS: 800.0000 mg | ORAL_TABLET | Freq: Three times a day (TID) | ORAL | Status: AC

## 2012-04-01 NOTE — ED Notes (Signed)
Pt reports sore throat, fever, chills, malaise since friday

## 2012-04-01 NOTE — ED Provider Notes (Signed)
History     CSN: [id number]  Arrival date & time 04/01/12  1249   First MD Initiated Contact with Patient 04/01/12 1314      Chief Complaint  Patient presents with  . Sore Throat  . Generalized Body Aches    (Consider location/radiation/quality/duration/timing/severity/associated sxs/prior treatment) Patient is a 37 y.o. female presenting with pharyngitis. The history is provided by the patient.  Sore Throat This is a new problem. The current episode started more than 2 days ago. The problem has been gradually worsening. Pertinent negatives include no chest pain and no abdominal pain.    Past Medical History  Diagnosis Date  . Diabetes mellitus   . Hypertension   . Fibromyalgia lupus  . Depression     Past Surgical History  Procedure Date  . Hand surgery   . Foot fracture surgery   . Cesarean section 08/30/2011    Procedure: CESAREAN SECTION;  Surgeon: Bing Plume, MD;  Location: [facility];  Service: Gynecology;  Laterality: N/A;  . Foot surgery     left   . Hand surgery     left  . Cesarean section     Family History  Problem Relation Age of Onset  . Hypertension Father   . Diabetes Father   . Drug abuse Father   . Bipolar disorder Sister     History  Substance Use Topics  . Smoking status: Current Every Day Smoker -- 0.2 packs/day for 10 years    Types: Cigarettes  . Smokeless tobacco: Never Used  . Alcohol Use: No    OB History    Grav Para Term Preterm Abortions TAB SAB Ect Mult Living   2 1 1  0 0 0 0 0 0 1      Review of Systems  Constitutional: Positive for fever and chills.  HENT: Positive for congestion.   Respiratory: Negative for cough.   Cardiovascular: Negative for chest pain.  Gastrointestinal: Positive for nausea. Negative for vomiting, abdominal pain and diarrhea.  Musculoskeletal: Positive for myalgias.    Allergies  Review of patient's allergies indicates no known allergies.  Home Medications   Current Outpatient Rx  Name   Route  Sig  Dispense  Refill  . ALPRAZOLAM 0.5 MG PO TABS   Oral   Take 0.5 mg by mouth daily as needed. For anxiety         . ATENOLOL-CHLORTHALIDONE 50-25 MG PO TABS   Oral   Take 1 tablet by mouth daily.          . CYCLOBENZAPRINE HCL 10 MG PO TABS   Oral   Take 10 mg by mouth daily as needed. For muscle spasms         . CYCLOBENZAPRINE HCL 10 MG PO TABS   Oral   Take 1 tablet (10 mg total) by mouth 2 (two) times daily as needed for muscle spasms.   20 tablet   0   . FEXOFENADINE HCL 180 MG PO TABS   Oral   Take 1 tablet (180 mg total) by mouth daily.   30 tablet   1   . FLUTICASONE PROPIONATE 50 MCG/ACT NA SUSP   Nasal   Place 1 spray into the nose 2 (two) times daily.   1 g   2   . IBUPROFEN 600 MG PO TABS   Oral   Take 600 mg by mouth every 6 (six) hours as needed. For pain/fever         .  IBUPROFEN 600 MG PO TABS   Oral   Take 1 tablet (600 mg total) by mouth every 6 (six) hours as needed for pain.   30 tablet   1   . IBUPROFEN 800 MG PO TABS   Oral   Take 1 tablet (800 mg total) by mouth every 6 (six) hours as needed for pain.   30 tablet   0   . IBUPROFEN 800 MG PO TABS   Oral   Take 1 tablet (800 mg total) by mouth 3 (three) times daily.   30 tablet   0   . LOXAPINE SUCCINATE 25 MG PO CAPS   Oral   Take 1 capsule (25 mg total) by mouth at bedtime.   30 capsule   1   . OXYCODONE-ACETAMINOPHEN 10-325 MG PO TABS               . OXYCODONE-ACETAMINOPHEN 5-325 MG PO TABS   Oral   Take 1 tablet by mouth every 4 (four) hours as needed for pain.   8 tablet   0     BP 133/93  Pulse 93  Temp 100.6 F (38.1 C) (Oral)  Resp 19  SpO2 97%  Physical Exam  Nursing note and vitals reviewed. Constitutional: She is oriented to person, place, and time. She appears well-developed and well-nourished.  HENT:  Head: Normocephalic.  Right Ear: External ear normal.  Left Ear: External ear normal.  Mouth/Throat: Oropharynx is clear and  moist.  Eyes: Conjunctivae normal and EOM are normal. Pupils are equal, round, and reactive to light.  Neck: Normal range of motion. Neck supple.  Cardiovascular: Normal rate, regular rhythm, normal heart sounds and intact distal pulses.   Pulmonary/Chest: Effort normal and breath sounds normal.  Abdominal: Soft. Bowel sounds are normal.  Lymphadenopathy:    She has no cervical adenopathy.  Neurological: She is alert and oriented to person, place, and time.  Skin: Skin is warm and dry.    ED Course  Procedures (including critical care time)   Labs Reviewed  POCT RAPID STREP A ([facility] ONLY)   No results found.   1. Influenza-like illness       MDM          Linna Hoff, MD 04/01/12 [phone]

## 2012-05-20 ENCOUNTER — Encounter (HOSPITAL_COMMUNITY): Payer: Self-pay | Admitting: Pharmacist

## 2012-05-20 ENCOUNTER — Inpatient Hospital Stay (HOSPITAL_COMMUNITY): Admission: RE | Admit: 2012-05-20 | Payer: Self-pay | Source: Ambulatory Visit

## 2012-05-27 ENCOUNTER — Encounter (HOSPITAL_COMMUNITY): Payer: Self-pay | Admitting: *Deleted

## 2012-05-27 NOTE — Anesthesia Preprocedure Evaluation (Addendum)
Anesthesia Evaluation  Patient identified by MRN, date of birth, ID band Patient awake    Reviewed: Allergy & Precautions, H&P , NPO status , Patient's Chart, lab work & pertinent test results  Airway Mallampati: III      Dental no notable dental hx. (+) Caps and Teeth Intact   Pulmonary Current Smoker,  Snores breath sounds clear to auscultation  Pulmonary exam normal       Cardiovascular hypertension, Rhythm:Regular Rate:Normal     Neuro/Psych Depression Bipolar Disorder  Neuromuscular disease    GI/Hepatic negative GI ROS, Neg liver ROS,   Endo/Other  diabetesMorbid obesity  Renal/GU negative Renal ROS  negative genitourinary   Musculoskeletal  (+) Fibromyalgia -  Abdominal (+) + obese,   Peds  Hematology negative hematology ROS (+)   Anesthesia Other Findings   Reproductive/Obstetrics negative OB ROS                          Anesthesia Physical Anesthesia Plan  ASA: III  Anesthesia Plan: General   Post-op Pain Management:    Induction: Intravenous  Airway Management Planned: Oral ETT  Additional Equipment:   Intra-op Plan:   Post-operative Plan: Extubation in OR  Informed Consent: I have reviewed the patients History and Physical, chart, labs and discussed the procedure including the risks, benefits and alternatives for the proposed anesthesia with the patient or authorized representative who has indicated his/her understanding and acceptance.   Dental advisory given  Plan Discussed with: CRNA, Anesthesiologist and Surgeon  Anesthesia Plan Comments:         Anesthesia Quick Evaluation

## 2012-05-28 ENCOUNTER — Encounter (HOSPITAL_COMMUNITY): Payer: Self-pay | Admitting: Anesthesiology

## 2012-05-28 ENCOUNTER — Ambulatory Visit (HOSPITAL_COMMUNITY)
Admission: RE | Admit: 2012-05-28 | Discharge: 2012-05-28 | Disposition: A | Discharge status: Home / Self Care | Payer: Medicaid Other | Source: Ambulatory Visit | Attending: Obstetrics and Gynecology | Admitting: Obstetrics and Gynecology

## 2012-05-28 ENCOUNTER — Encounter (HOSPITAL_COMMUNITY)
Admission: RE | Disposition: A | Discharge status: Home / Self Care | Payer: Self-pay | Source: Ambulatory Visit | Attending: Obstetrics and Gynecology

## 2012-05-28 ENCOUNTER — Ambulatory Visit (HOSPITAL_COMMUNITY): Payer: Medicaid Other | Admitting: Anesthesiology

## 2012-05-28 ENCOUNTER — Other Ambulatory Visit: Payer: Self-pay | Admitting: Obstetrics and Gynecology

## 2012-05-28 DIAGNOSIS — F329 Major depressive disorder, single episode, unspecified: Secondary | ICD-10-CM

## 2012-05-28 DIAGNOSIS — Z302 Encounter for sterilization: Secondary | ICD-10-CM | POA: Insufficient documentation

## 2012-05-28 HISTORY — PX: LAPAROSCOPIC TUBAL LIGATION: SHX1937

## 2012-05-28 LAB — PREGNANCY, URINE: Preg Test, Ur: NEGATIVE

## 2012-05-28 LAB — CBC
HCT: 35.3 % — ABNORMAL LOW (ref 36.0–46.0)
Hemoglobin: 11.1 g/dL — ABNORMAL LOW (ref 12.0–15.0)
MCV: 81.5 fL (ref 78.0–100.0)
RDW: 16.2 % — ABNORMAL HIGH (ref 11.5–15.5)
WBC: 5.9 10*3/uL (ref 4.0–10.5)

## 2012-05-28 LAB — SURGICAL PCR SCREEN
MRSA, PCR: NEGATIVE
Staphylococcus aureus: NEGATIVE

## 2012-05-28 LAB — GLUCOSE, CAPILLARY: Glucose-Capillary: 111 mg/dL — ABNORMAL HIGH (ref 70–99)

## 2012-05-28 SURGERY — LIGATION, FALLOPIAN TUBE, LAPAROSCOPIC
Anesthesia: General | Wound class: Clean Contaminated

## 2012-05-28 MED ORDER — KETOROLAC TROMETHAMINE 30 MG/ML IJ SOLN
INTRAMUSCULAR | Status: DC | PRN
  Administered 2012-05-28: 30 mg via INTRAMUSCULAR

## 2012-05-28 MED ORDER — DEXAMETHASONE SODIUM PHOSPHATE 4 MG/ML IJ SOLN
INTRAMUSCULAR | Status: DC | PRN
  Administered 2012-05-28: 4 mg via INTRAVENOUS

## 2012-05-28 MED ORDER — MUPIROCIN 2 % EX OINT: TOPICAL_OINTMENT | Freq: Two times a day (BID) | CUTANEOUS | Status: DC

## 2012-05-28 MED ORDER — PROPOFOL 10 MG/ML IV EMUL
INTRAVENOUS | Status: AC
  Filled 2012-05-28: qty 20

## 2012-05-28 MED ORDER — GLYCOPYRROLATE 0.2 MG/ML IJ SOLN
INTRAMUSCULAR | Status: DC | PRN
  Administered 2012-05-28: 0.6 mg via INTRAVENOUS

## 2012-05-28 MED ORDER — FENTANYL CITRATE 0.05 MG/ML IJ SOLN
INTRAMUSCULAR | Status: AC
  Filled 2012-05-28: qty 5

## 2012-05-28 MED ORDER — LIDOCAINE HCL (CARDIAC) 20 MG/ML IV SOLN
INTRAVENOUS | Status: DC | PRN
  Administered 2012-05-28: 80 mg via INTRAVENOUS

## 2012-05-28 MED ORDER — KETOROLAC TROMETHAMINE 30 MG/ML IJ SOLN
INTRAMUSCULAR | Status: AC
  Filled 2012-05-28: qty 1

## 2012-05-28 MED ORDER — ROCURONIUM BROMIDE 50 MG/5ML IV SOLN
INTRAVENOUS | Status: AC
  Filled 2012-05-28: qty 1

## 2012-05-28 MED ORDER — MIDAZOLAM HCL 5 MG/5ML IJ SOLN
INTRAMUSCULAR | Status: DC | PRN
  Administered 2012-05-28 (×2): 1 mg via INTRAVENOUS

## 2012-05-28 MED ORDER — HYDROMORPHONE HCL PF 1 MG/ML IJ SOLN
INTRAMUSCULAR | Status: AC
  Administered 2012-05-28: 0.25 mg via INTRAVENOUS
  Filled 2012-05-28: qty 1

## 2012-05-28 MED ORDER — GLYCOPYRROLATE 0.2 MG/ML IJ SOLN
INTRAMUSCULAR | Status: AC
  Filled 2012-05-28: qty 2

## 2012-05-28 MED ORDER — NEOSTIGMINE METHYLSULFATE 1 MG/ML IJ SOLN
INTRAMUSCULAR | Status: AC
  Filled 2012-05-28: qty 1

## 2012-05-28 MED ORDER — 0.9 % SODIUM CHLORIDE (POUR BTL) OPTIME
TOPICAL | Status: DC | PRN
  Administered 2012-05-28: 1000 mL

## 2012-05-28 MED ORDER — LIDOCAINE HCL (CARDIAC) 20 MG/ML IV SOLN
INTRAVENOUS | Status: AC
  Filled 2012-05-28: qty 5

## 2012-05-28 MED ORDER — HYDROMORPHONE HCL PF 1 MG/ML IJ SOLN: 0.2500 mg | INTRAMUSCULAR | Status: DC | PRN

## 2012-05-28 MED ORDER — LACTATED RINGERS IV SOLN
INTRAVENOUS | Status: DC
  Administered 2012-05-28 (×3): via INTRAVENOUS

## 2012-05-28 MED ORDER — MIDAZOLAM HCL 2 MG/2ML IJ SOLN
INTRAMUSCULAR | Status: AC
  Filled 2012-05-28: qty 2

## 2012-05-28 MED ORDER — ONDANSETRON HCL 4 MG/2ML IJ SOLN
INTRAMUSCULAR | Status: DC | PRN
  Administered 2012-05-28: 4 mg via INTRAVENOUS

## 2012-05-28 MED ORDER — METOCLOPRAMIDE HCL 5 MG/ML IJ SOLN: 10.0000 mg | Freq: Once | INTRAMUSCULAR | Status: DC | PRN

## 2012-05-28 MED ORDER — MEPERIDINE HCL 25 MG/ML IJ SOLN: 6.2500 mg | INTRAMUSCULAR | Status: DC | PRN

## 2012-05-28 MED ORDER — PROPOFOL 10 MG/ML IV EMUL
INTRAVENOUS | Status: DC | PRN
  Administered 2012-05-28: 200 mg via INTRAVENOUS

## 2012-05-28 MED ORDER — ROCURONIUM BROMIDE 100 MG/10ML IV SOLN
INTRAVENOUS | Status: DC | PRN
  Administered 2012-05-28: 10 mg via INTRAVENOUS
  Administered 2012-05-28 (×2): 50 mg via INTRAVENOUS
  Administered 2012-05-28: 5 mg via INTRAVENOUS

## 2012-05-28 MED ORDER — GLYCOPYRROLATE 0.2 MG/ML IJ SOLN
INTRAMUSCULAR | Status: AC
  Filled 2012-05-28: qty 1

## 2012-05-28 MED ORDER — MUPIROCIN 2 % EX OINT
TOPICAL_OINTMENT | CUTANEOUS | Status: AC
  Filled 2012-05-28: qty 22

## 2012-05-28 MED ORDER — DEXAMETHASONE SODIUM PHOSPHATE 10 MG/ML IJ SOLN
INTRAMUSCULAR | Status: AC
  Filled 2012-05-28: qty 1

## 2012-05-28 MED ORDER — FENTANYL CITRATE 0.05 MG/ML IJ SOLN
INTRAMUSCULAR | Status: DC | PRN
  Administered 2012-05-28: 50 ug via INTRAVENOUS
  Administered 2012-05-28: 100 ug via INTRAVENOUS
  Administered 2012-05-28 (×2): 50 ug via INTRAVENOUS

## 2012-05-28 MED ORDER — ONDANSETRON HCL 4 MG/2ML IJ SOLN
INTRAMUSCULAR | Status: AC
  Filled 2012-05-28: qty 2

## 2012-05-28 MED ORDER — NEOSTIGMINE METHYLSULFATE 1 MG/ML IJ SOLN
INTRAMUSCULAR | Status: DC | PRN
  Administered 2012-05-28: 3 mg via INTRAVENOUS

## 2012-05-28 SURGICAL SUPPLY — 22 items
BLADE SURG 15 STRL LF C SS BP (BLADE) ×1 IMPLANT
BLADE SURG 15 STRL SS (BLADE) ×2
CATH ROBINSON RED A/P 16FR (CATHETERS) ×2 IMPLANT
CLOTH BEACON ORANGE TIMEOUT ST (SAFETY) ×2 IMPLANT
DRSG COVADERM PLUS 2X2 (GAUZE/BANDAGES/DRESSINGS) ×1 IMPLANT
GLOVE BIO SURGEON STRL SZ7.5 (GLOVE) ×3 IMPLANT
GLOVE ECLIPSE 6.0 STRL STRAW (GLOVE) ×1 IMPLANT
GLOVE INDICATOR 6.5 STRL GRN (GLOVE) ×2 IMPLANT
GOWN PREVENTION PLUS XLARGE (GOWN DISPOSABLE) ×2 IMPLANT
PACK LAPAROSCOPY BASIN (CUSTOM PROCEDURE TRAY) ×2 IMPLANT
STRIP CLOSURE SKIN 1/2X4 (GAUZE/BANDAGES/DRESSINGS) ×1 IMPLANT
STRIP CLOSURE SKIN 1/4X3 (GAUZE/BANDAGES/DRESSINGS) ×2 IMPLANT
SUT PDS AB 0 CT 36 (SUTURE) IMPLANT
SUT PDS AB 0 CT1 27 (SUTURE) ×1 IMPLANT
SUT PLAIN 3 0 FS2 (SUTURE) ×2 IMPLANT
SUT VIC AB 3-0 CTX 36 (SUTURE) ×1 IMPLANT
SUT VICRYL 0 UR6 27IN ABS (SUTURE) IMPLANT
TOWEL OR 17X24 6PK STRL BLUE (TOWEL DISPOSABLE) ×4 IMPLANT
TROCAR BALLN 12MMX100 BLUNT (TROCAR) ×1 IMPLANT
TROCAR OPTI TIP 5M 100M (ENDOMECHANICALS) ×2 IMPLANT
TROCAR XCEL DIL TIP R 11M (ENDOMECHANICALS) ×2 IMPLANT
WATER STERILE IRR 1000ML POUR (IV SOLUTION) ×2 IMPLANT

## 2012-05-28 NOTE — OR Nursing (Signed)
0730 history and physical on paper on the chart.

## 2012-05-28 NOTE — Op Note (Signed)
NAME:  Virginia Simmons, EBLEN                ACCOUNT NO.:  [id number]  MEDICAL RECORD NO.:  [id number]  LOCATION:  [facility]                          FACILITY:  [facility]  PHYSICIAN:  Malachi Pro. Ambrose Mantle, M.D. DATE OF BIRTH:  08/26/1975  DATE OF PROCEDURE:  05/28/2012 DATE OF DISCHARGE:                              OPERATIVE REPORT   PREOPERATIVE DIAGNOSIS:  Voluntary sterilization.  POSTOPERATIVE DIAGNOSIS:  Voluntary sterilization.  OPERATION:  Laparoscopic tubal cauterization.  OPERATOR:  Malachi Pro. Ambrose Mantle, M.D.  ANESTHESIA:  General anesthesia.  DESCRIPTION OF PROCEDURE:  The patient was brought to the operating room and placed on the operating room table.  There was some delay in getting the patient's IV to run, causes seemed to be quite positional so induction of general anesthesia was delayed.  Finally, the anesthesia was accomplished even though is only accomplished after the left arm was allowed to go out to the side, which subsequently compromise my position on the operating table.  After general anesthesia was obtained and intubation was done, the patient was in [place] stirrups.  The abdomen, vulva, vagina, and urethra were prepped with Betadine solution.  The bladder was emptied with [place] catheter.  Exam revealed the uterus to be anterior, normal size.  The adnexa were free of masses.  A Hulka cannula was placed into the uterus and attached to the anterior cervical lip.  The abdomen was then draped as a sterile field.  Allis clamps were placed on both sides of the umbilicus.  I made an incision through the skin, then enlarged the incision somewhat, dissected down to what I thought was the fascia, incised it, but I think it was superficial to the fascia.  After a couple of more attempts, I was able to identify the fascia, entered it and with my finger entered the peritoneal cavity.  I placed a 0 PDS pursestring suture around the fascial opening.  I then placed the Hasson cannula with the  balloon, inflated the balloon and tied the suture down.  I established the pneumoperitoneum, and then placed an accessory trocar suprapubically under direct vision.  I made this incision higher on her abdominal wall in her C-section because the C-section scar had dipped into the crease of her panniculus.  I was able to identify both tubes and ovaries.  The uterus appeared normal.  Both tubes were normal to their fimbriated ends.  The right ovary was normal. The left ovary was somewhat cystic but appeared completely benign with clear cystic fluid inside.  Each tube was cauterized in 3 or 4 consecutive locations starting at the mid point of the tube and going toward the cornu.  There were no complications.  I turned the laparoscope, superiorly saw the liver edge, it was smooth.  No abnormalities were seen.  I removed the lower abdominal trocar under direct vision, saw no bleeding, released the pneumoperitoneum, deflated the balloon, removed the Hasson cannula and then pulled the pursestring suture of 0 PDS down and noticed good closure of the fascia.  There were no other defects in the fascia.  I put 8 or 9 throws in the PDS suture, cut it, closed the subcutaneous tissue with interrupted 3-  0 Vicryl, and then closed the skin with interrupted 3-0 plain catgut. The patient seemed to tolerate the procedure well.  Blood loss was probably 30 mL from the subcutaneous tissue.  A Hulka cannula was removed, and the patient returned to recovery room in satisfactory condition.     Malachi Pro. Ambrose Mantle, M.D.     TFH/MEDQ  D:  05/28/2012  T:  05/28/2012  Job:  [id number]

## 2012-05-28 NOTE — Transfer of Care (Signed)
Immediate Anesthesia Transfer of Care Note  Patient: Virginia Simmons  Procedure(s) Performed: Procedure(s) with comments: LAPAROSCOPIC TUBAL LIGATION (N/A) - laparoscopic tubal cauterization  Patient Location: PACU  Anesthesia Type:General  Level of Consciousness: awake, alert , oriented and patient cooperative  Airway & Oxygen Therapy: Patient Spontanous Breathing and Patient connected to nasal cannula oxygen  Post-op Assessment: Report given to PACU RN and Post -op Vital signs reviewed and stable  Post vital signs: Reviewed and stable  Complications: No apparent anesthesia complications

## 2012-05-28 NOTE — H&P (Signed)
NAME:  Virginia Simmons, Virginia Simmons                ACCOUNT NO.:  [id number]  MEDICAL RECORD NO.:  [id number]  LOCATION:                                 FACILITY:  PHYSICIAN:  Malachi Pro. Ambrose Mantle, M.D. DATE OF BIRTH:  Aug 11, 1975  DATE OF ADMISSION: DATE OF DISCHARGE:                             HISTORY & PHYSICAL   PRESENT ILLNESS:  A 37 year old black female, para 1-0-0-1, who is admitted for a tubal ligation by laparoscopy.  The patient is on Depo- Provera for contraception, and she is not having regular periods.  She delivered 8 months ago by cesarean section, and she is convinced that she wants no more children.  She has done what she can to prevent pregnancy since she delivered.  She actually had an Nexplanon inserted, but without her knowledge the Nexplanon was extruded.  She became pregnant and had a termination of pregnancy.  We did x-rays to be sure that the Nexplanon was not in the body and it was not found.  She understands there is a failure rate of about 1:100 to the tubal ligation.  She understands it is a permanent procedure.  She is convinced that she under no circumstances ever wants to be pregnant again.  PAST MEDICAL HISTORY:  Reveals a history of ADD and bipolar disease. She has a history of diabetes in the past, history of fibroids, fibromyalgia, history of high blood pressure.  SURGICAL HISTORY:  She has had hand surgery in the 5th grade, foot surgery at age 38, cesarean section, and termination of pregnancy.  ALLERGIES:  She got hives from a flu shot in 2010.  She has no other known drug allergies.  She reacted to shell fish in 2007, with swelling of her throat and swelling in other parts of her body.  FAMILY HISTORY:  Father has asthma, diabetes, heart disease, high blood pressure, and kidney disease.  Her mother has high blood pressure.  The patient denies alcohol, and illicit substance abuse.  She admits to smoking.  PHYSICAL EXAMINATION:  GENERAL:  Today,  well-developed, obese black female, in no distress. HEAD, EYES, NOSE, AND THROAT:  Normal. HEART:  Normal size.  No murmurs. LUNGS:  Clear to auscultation. NECK:  Supple without thyromegaly. BREASTS:  Not examined. ABDOMEN:  Soft and very obese.  No masses are felt. PELVIC:  There is a bloody discharge in the vagina.  The cervix is clean.  The uterus is hard to feel.  Adnexa are free of masses.  ADMITTING IMPRESSION:  Voluntary sterilization.  The patient is prepared for tubal ligation.  She has been informed of the risks of surgery include, but not limited to, injury to surrounding structures, bleeding, infection, wound disruption.  She understands and agrees to proceed.     Malachi Pro. Ambrose Mantle, M.D.     TFH/MEDQ  D:  05/27/2012  T:  05/27/2012  Job:  [id number]

## 2012-05-28 NOTE — Progress Notes (Signed)
Patient ID: Virginia Simmons, female   DOB: [birth year], 37 y.o.   MRN: [id number] I examined this lady 05-27-12 and she reports no change in her health since that time.

## 2012-05-28 NOTE — Preoperative (Addendum)
Beta Blockers   Reason not to administer Beta Blockers:Not Applicable 

## 2012-05-28 NOTE — Discharge Instructions (Signed)
No vaginal entrance. Call with temp > 100.4 degrees. Avoid heavy lifting and strenuous activity. Call with any problems. DISCHARGE INSTRUCTIONS: Laparoscopy  The following instructions have been prepared to help you care for yourself upon your return home today.  Wound care:  Do not get the incision wet for the first 24 hours. The incision should be kept clean and dry.  The Band-Aids or dressings may be removed the day after surgery.  Should the incision become sore, red, and swollen after the first week, check with your doctor.  Personal hygiene:  Shower the day after your procedure.  Activity and limitations:  Do NOT drive or operate any equipment today.  Do NOT lift anything more than 15 pounds for 2-3 weeks after surgery.  Do NOT rest in bed all day.  Walking is encouraged. Walk each day, starting slowly with 5-minute walks 3 or 4 times a day. Slowly increase the length of your walks.  Walk up and down stairs slowly.  Do NOT do strenuous activities, such as golfing, playing tennis, bowling, running, biking, weight lifting, gardening, mowing, or vacuuming for 2-4 weeks. Ask your doctor when it is okay to start.  Diet: Eat a light meal as desired this evening. You may resume your usual diet tomorrow.  Return to work: This is dependent on the type of work you do. For the most part you can return to a desk job within a week of surgery. If you are more active at work, please discuss this with your doctor.  What to expect after your surgery: You may have a slight burning sensation when you urinate on the first day. You may have a very small amount of blood in the urine. Expect to have a small amount of vaginal discharge/light bleeding for 1-2 weeks. It is not unusual to have abdominal soreness and bruising for up to 2 weeks. You may be tired and need more rest for about 1 week. You may experience shoulder pain for 24-72 hours. Lying flat in bed may relieve it.  Call your doctor for  any of the following:  Develop a fever of 100.4 or greater  Inability to urinate 6 hours after discharge from hospital  Severe pain not relieved by pain medications  Persistent of heavy bleeding at incision site  Redness or swelling around incision site after a week  Increasing nausea or vomiting  Patient Signature________________________________________ Nurse Signature_________________________________________

## 2012-05-29 ENCOUNTER — Encounter (HOSPITAL_COMMUNITY): Payer: Self-pay | Admitting: Obstetrics and Gynecology

## 2012-06-11 ENCOUNTER — Encounter (HOSPITAL_COMMUNITY): Payer: Self-pay | Admitting: Psychology

## 2012-06-11 DIAGNOSIS — F3130 Bipolar disorder, current episode depressed, mild or moderate severity, unspecified: Secondary | ICD-10-CM

## 2012-06-11 NOTE — Progress Notes (Signed)
Outpatient Therapist Discharge Summary  Seleen A Guzzo    [birth year]   Admission Date: 05/01/11   Discharge Date:  06/10/12 Reason for Discharge:  Not active in tx since 01/2012 Diagnosis:    Bipolar I disorder, most recent episode (or current) depressed, unspecified   Comments:  Pt initially treated by Shonna Chock till her retirement and then transferred to [facility] 09/2011.  [facility]

## 2012-08-16 ENCOUNTER — Inpatient Hospital Stay (HOSPITAL_COMMUNITY)
Admission: EM | Admit: 2012-08-16 | Discharge: 2012-08-19 | DRG: 095 | Disposition: A | Discharge status: Home / Self Care | Payer: 59 | Attending: Internal Medicine | Admitting: Internal Medicine

## 2012-08-16 ENCOUNTER — Emergency Department (HOSPITAL_COMMUNITY): Payer: 59

## 2012-08-16 ENCOUNTER — Encounter (HOSPITAL_COMMUNITY): Payer: Self-pay | Admitting: Emergency Medicine

## 2012-08-16 DIAGNOSIS — IMO0001 Reserved for inherently not codable concepts without codable children: Secondary | ICD-10-CM | POA: Diagnosis present

## 2012-08-16 DIAGNOSIS — Z8614 Personal history of Methicillin resistant Staphylococcus aureus infection: Secondary | ICD-10-CM

## 2012-08-16 DIAGNOSIS — F17200 Nicotine dependence, unspecified, uncomplicated: Secondary | ICD-10-CM | POA: Diagnosis present

## 2012-08-16 DIAGNOSIS — Z8639 Personal history of other endocrine, nutritional and metabolic disease: Secondary | ICD-10-CM | POA: Diagnosis present

## 2012-08-16 DIAGNOSIS — G009 Bacterial meningitis, unspecified: Principal | ICD-10-CM | POA: Diagnosis present

## 2012-08-16 DIAGNOSIS — R634 Abnormal weight loss: Secondary | ICD-10-CM | POA: Diagnosis present

## 2012-08-16 DIAGNOSIS — F3130 Bipolar disorder, current episode depressed, mild or moderate severity, unspecified: Secondary | ICD-10-CM | POA: Diagnosis present

## 2012-08-16 DIAGNOSIS — F17210 Nicotine dependence, cigarettes, uncomplicated: Secondary | ICD-10-CM | POA: Diagnosis present

## 2012-08-16 DIAGNOSIS — F419 Anxiety disorder, unspecified: Secondary | ICD-10-CM | POA: Diagnosis present

## 2012-08-16 DIAGNOSIS — F329 Major depressive disorder, single episode, unspecified: Secondary | ICD-10-CM | POA: Diagnosis present

## 2012-08-16 DIAGNOSIS — M329 Systemic lupus erythematosus, unspecified: Secondary | ICD-10-CM | POA: Diagnosis present

## 2012-08-16 DIAGNOSIS — Z794 Long term (current) use of insulin: Secondary | ICD-10-CM

## 2012-08-16 DIAGNOSIS — I10 Essential (primary) hypertension: Secondary | ICD-10-CM

## 2012-08-16 DIAGNOSIS — E119 Type 2 diabetes mellitus without complications: Secondary | ICD-10-CM | POA: Diagnosis present

## 2012-08-16 DIAGNOSIS — J302 Other seasonal allergic rhinitis: Secondary | ICD-10-CM | POA: Diagnosis present

## 2012-08-16 LAB — BASIC METABOLIC PANEL
CO2: 24 mEq/L (ref 19–32)
Calcium: 9 mg/dL (ref 8.4–10.5)
Creatinine, Ser: 0.99 mg/dL (ref 0.50–1.10)
GFR calc Af Amer: 84 mL/min — ABNORMAL LOW (ref >90)
GFR calc non Af Amer: 72 mL/min — ABNORMAL LOW (ref >90)
Sodium: 132 mEq/L — ABNORMAL LOW (ref 135–145)

## 2012-08-16 LAB — CBC WITH DIFFERENTIAL/PLATELET
Basophils Absolute: 0 10*3/uL (ref 0.0–0.1)
Basophils Relative: 0 % (ref 0–1)
Eosinophils Absolute: 0 10*3/uL (ref 0.0–0.7)
Eosinophils Relative: 0 % (ref 0–5)
HCT: 39.7 % (ref 36.0–46.0)
Lymphocytes Relative: 17 % (ref 12–46)
MCH: 26.7 pg (ref 26.0–34.0)
MCHC: 34.3 g/dL (ref 30.0–36.0)
MCV: 77.8 fL — ABNORMAL LOW (ref 78.0–100.0)
Monocytes Absolute: 0.2 10*3/uL (ref 0.1–1.0)
Platelets: 216 10*3/uL (ref 150–400)
RDW: 15.6 % — ABNORMAL HIGH (ref 11.5–15.5)
WBC: 7.8 10*3/uL (ref 4.0–10.5)

## 2012-08-16 LAB — GRAM STAIN: Special Requests: NORMAL

## 2012-08-16 LAB — CSF CELL COUNT WITH DIFFERENTIAL
Lymphs, CSF: 37 % — ABNORMAL LOW (ref 40–80)
Monocyte-Macrophage-Spinal Fluid: 7 % — ABNORMAL LOW (ref 15–45)
RBC Count, CSF: 468 /mm3 — ABNORMAL HIGH
Segmented Neutrophils-CSF: 56 % — ABNORMAL HIGH (ref 0–6)
Tube #: 1

## 2012-08-16 LAB — CG4 I-STAT (LACTIC ACID): Lactic Acid, Venous: 1.89 mmol/L (ref 0.5–2.2)

## 2012-08-16 LAB — GLUCOSE, CSF: Glucose, CSF: 49 mg/dL (ref 43–76)

## 2012-08-16 MED ORDER — KETOROLAC TROMETHAMINE 30 MG/ML IJ SOLN
30.0000 mg | Freq: Once | INTRAMUSCULAR | Status: AC
  Administered 2012-08-16: 30 mg via INTRAVENOUS
  Filled 2012-08-16: qty 1

## 2012-08-16 MED ORDER — SODIUM CHLORIDE 0.9 % IV BOLUS (SEPSIS)
500.0000 mL | Freq: Once | INTRAVENOUS | Status: AC
  Administered 2012-08-16: 500 mL via INTRAVENOUS

## 2012-08-16 MED ORDER — SODIUM CHLORIDE 0.9 % IV BOLUS (SEPSIS)
1000.0000 mL | Freq: Once | INTRAVENOUS | Status: AC
  Administered 2012-08-16: 1000 mL via INTRAVENOUS

## 2012-08-16 MED ORDER — DEXAMETHASONE SODIUM PHOSPHATE 10 MG/ML IJ SOLN
10.0000 mg | Freq: Once | INTRAMUSCULAR | Status: AC
  Administered 2012-08-16: 10 mg via INTRAVENOUS
  Filled 2012-08-16: qty 1

## 2012-08-16 MED ORDER — DEXTROSE 5 % IV SOLN
2.0000 g | Freq: Once | INTRAVENOUS | Status: AC
  Administered 2012-08-16: 2 g via INTRAVENOUS
  Filled 2012-08-16: qty 2

## 2012-08-16 MED ORDER — VANCOMYCIN HCL IN DEXTROSE 1-5 GM/200ML-% IV SOLN: 1000.0000 mg | Freq: Once | INTRAVENOUS | Status: DC

## 2012-08-16 MED ORDER — VANCOMYCIN HCL IN DEXTROSE 1-5 GM/200ML-% IV SOLN
1000.0000 mg | Freq: Once | INTRAVENOUS | Status: AC
  Administered 2012-08-16: 1000 mg via INTRAVENOUS
  Filled 2012-08-16: qty 200

## 2012-08-16 MED ORDER — DEXTROSE 5 % IV SOLN
2.0000 g | Freq: Two times a day (BID) | INTRAVENOUS | Status: DC
  Administered 2012-08-17 – 2012-08-19 (×5): 2 g via INTRAVENOUS
  Filled 2012-08-16 (×7): qty 2

## 2012-08-16 MED ORDER — VANCOMYCIN HCL IN DEXTROSE 1-5 GM/200ML-% IV SOLN
1000.0000 mg | Freq: Two times a day (BID) | INTRAVENOUS | Status: DC
  Administered 2012-08-17 – 2012-08-19 (×5): 1000 mg via INTRAVENOUS
  Filled 2012-08-16 (×7): qty 200

## 2012-08-16 MED ORDER — SODIUM CHLORIDE 0.9 % IV SOLN
2.0000 g | INTRAVENOUS | Status: AC
  Administered 2012-08-16: 2 g via INTRAVENOUS
  Filled 2012-08-16: qty 2000

## 2012-08-16 MED ORDER — AMPICILLIN SODIUM 2 G IJ SOLR
2.0000 g | INTRAMUSCULAR | Status: DC
  Administered 2012-08-17 (×3): 2 g via INTRAVENOUS
  Filled 2012-08-16 (×8): qty 2000

## 2012-08-16 MED ORDER — ACETAMINOPHEN 325 MG PO TABS
650.0000 mg | ORAL_TABLET | Freq: Once | ORAL | Status: AC
  Administered 2012-08-16: 650 mg via ORAL
  Filled 2012-08-16: qty 2

## 2012-08-16 NOTE — ED Notes (Signed)
Pt requested something to drink. Pt informed that she is going for a CT scan and would not be able to have something to drink until scan was completed. Pt laying in bed supine, moaning, and requested the lights to be off at this time. Pt updated on plan of care. CBG 111

## 2012-08-16 NOTE — ED Notes (Signed)
Pt returned from radiology.

## 2012-08-16 NOTE — ED Provider Notes (Signed)
History     CSN: [id number]  Arrival date & time 08/16/12  1726   First MD Initiated Contact with Patient 08/16/12 1742      Chief Complaint  Patient presents with  . Headache    (Consider location/radiation/quality/duration/timing/severity/associated sxs/prior treatment) HPI Comments: Patient presents to the ER for evaluation of headache. Patient reports that her symptoms have been present for one week. Headache has progressively worsened over this period of time. Patient reports that she has had nausea and vomiting. Pain is worsened by exposure to bright light. Patient reports that she has had previous headaches similar to this. She says she was seen in this ER for this, treated with antibiotics and improved.  Patient is a 37 y.o. female presenting with headaches.  Headache Associated symptoms: nausea and vomiting   Associated symptoms: no fever, no neck pain and no neck stiffness     Past Medical History  Diagnosis Date  . Diabetes mellitus   . Hypertension   . Fibromyalgia lupus  . Depression     Past Surgical History  Procedure Laterality Date  . Hand surgery    . Foot fracture surgery    . Cesarean section  08/30/2011    Procedure: CESAREAN SECTION;  Surgeon: Bing Plume, MD;  Location: [facility];  Service: Gynecology;  Laterality: N/A;  . Foot surgery      left   . Hand surgery      left  . Cesarean section    . Laparoscopic tubal ligation N/A 05/28/2012    Procedure: LAPAROSCOPIC TUBAL LIGATION;  Surgeon: Bing Plume, MD;  Location: [facility];  Service: Gynecology;  Laterality: N/A;  laparoscopic tubal cauterization    Family History  Problem Relation Age of Onset  . Hypertension Father   . Diabetes Father   . Drug abuse Father   . Bipolar disorder Sister     History  Substance Use Topics  . Smoking status: Current Every Day Smoker -- 0.25 packs/day for 10 years    Types: Cigarettes  . Smokeless tobacco: Never Used  . Alcohol Use: No    OB History    Grav Para Term Preterm Abortions TAB SAB Ect Mult Living   2 1 1  0 0 0 0 0 0 1      Review of Systems  Constitutional: Negative for fever.  HENT: Negative for neck pain and neck stiffness.   Gastrointestinal: Positive for nausea and vomiting.  Neurological: Positive for headaches.  All other systems reviewed and are negative.    Allergies  Review of patient's allergies indicates no known allergies.  Home Medications   Current Outpatient Rx  Name  Route  Sig  Dispense  Refill  . ALPRAZolam (XANAX) 1 MG tablet   Oral   Take 1 mg by mouth at bedtime as needed for sleep.         . fexofenadine (ALLEGRA) 180 MG tablet   Oral   Take 1 tablet (180 mg total) by mouth daily.   30 tablet   1   . fluticasone (FLONASE) 50 MCG/ACT nasal spray   Nasal   Place 1 spray into the nose 2 (two) times daily.   1 g   2   . ibuprofen (ADVIL,MOTRIN) 800 MG tablet   Oral   Take 1 tablet (800 mg total) by mouth 3 (three) times daily.   30 tablet   0     BP 109/60  Pulse 125  Temp(Src) 101.1 F (38.4 C) (  Oral)  Resp 24  SpO2 100%  Physical Exam  Constitutional: She is oriented to person, place, and time. She appears well-developed and well-nourished. She appears listless. No distress.  HENT:  Head: Normocephalic and atraumatic.  Right Ear: Hearing normal.  Left Ear: Hearing normal.  Nose: Nose normal.  Mouth/Throat: Oropharynx is clear and moist and mucous membranes are normal.  Eyes: Conjunctivae and EOM are normal. Pupils are equal, round, and reactive to light.  Neck: Normal range of motion. Neck supple.  Cardiovascular: Regular rhythm, S1 normal and S2 normal.  Exam reveals no gallop and no friction rub.   No murmur heard. Pulmonary/Chest: Effort normal and breath sounds normal. No respiratory distress. She exhibits no tenderness.  Abdominal: Soft. Normal appearance and bowel sounds are normal. There is no hepatosplenomegaly. There is no tenderness. There is no rebound,  no guarding, no tenderness at McBurney's point and negative Murphy's sign. No hernia.  Musculoskeletal: Normal range of motion.  Neurological: She is oriented to person, place, and time. She has normal strength. She appears listless. No cranial nerve deficit or sensory deficit. Coordination normal. GCS eye subscore is 4. GCS verbal subscore is 5. GCS motor subscore is 6.  Skin: Skin is warm, dry and intact. No rash noted. No cyanosis.  Psychiatric: She has a normal mood and affect. Her speech is normal and behavior is normal. Thought content normal.    ED Course  Procedures (including critical care time)  PROCEDURE: Lumbar Puncture The patient was placed in the seated with help from the nursing staff. The area was cleansed and draped in usual sterile fashion. Anesthesia was achieved with 1% lidocaine. A 20-gauge 3.5-inch spinal needle was placed in the L4-L5 interspace. On the first attempt, clear cerebral spinal fluid was obtained. Four tubes were filled with 4 mL of CSF. These were sent for the usual tests, including 1 tube to be held for further analysis if needed. The patient had no immediate complications and tolerated the procedure well. Opening pressure was 24 in seated position.   Labs Reviewed  CBC WITH DIFFERENTIAL - Abnormal; Notable for the following:    MCV 77.8 (*)    RDW 15.6 (*)    Neutrophils Relative % 80 (*)    All other components within normal limits  BASIC METABOLIC PANEL - Abnormal; Notable for the following:    Sodium 132 (*)    Glucose, Bld 100 (*)    GFR calc non Af Amer 72 (*)    GFR calc Af Amer 84 (*)    All other components within normal limits  CSF CELL COUNT WITH DIFFERENTIAL - Abnormal; Notable for the following:    Appearance, CSF HAZY (*)    RBC Count, CSF 468 (*)    WBC, CSF 40 (*)    Segmented Neutrophils-CSF 56 (*)    Lymphs, CSF 37 (*)    Monocyte-Macrophage-Spinal Fluid 7 (*)    All other components within normal limits  PROTEIN, CSF -  Abnormal; Notable for the following:    Total  Protein, CSF 64 (*)    All other components within normal limits  COMPREHENSIVE METABOLIC PANEL - Abnormal; Notable for the following:    Glucose, Bld 222 (*)    Calcium 7.9 (*)    Albumin 2.5 (*)    Total Bilirubin 0.2 (*)    GFR calc non Af Amer 78 (*)    All other components within normal limits  CBC - Abnormal; Notable for the following:  RDW 16.0 (*)    All other components within normal limits  GLUCOSE, CAPILLARY - Abnormal; Notable for the following:    Glucose-Capillary 111 (*)    All other components within normal limits  HEMOGLOBIN A1C - Abnormal; Notable for the following:    Hemoglobin A1C 6.1 (*)    Mean Plasma Glucose 128 (*)    All other components within normal limits  CBC - Abnormal; Notable for the following:    Hemoglobin 11.4 (*)    HCT 34.2 (*)    RDW 16.5 (*)    All other components within normal limits  BASIC METABOLIC PANEL - Abnormal; Notable for the following:    Glucose, Bld 141 (*)    Calcium 8.2 (*)    All other components within normal limits  GLUCOSE, CAPILLARY - Abnormal; Notable for the following:    Glucose-Capillary 125 (*)    All other components within normal limits  GLUCOSE, CAPILLARY - Abnormal; Notable for the following:    Glucose-Capillary 152 (*)    All other components within normal limits  GLUCOSE, CAPILLARY - Abnormal; Notable for the following:    Glucose-Capillary 110 (*)    All other components within normal limits  GLUCOSE, CAPILLARY - Abnormal; Notable for the following:    Glucose-Capillary 110 (*)    All other components within normal limits  GLUCOSE, CAPILLARY - Abnormal; Notable for the following:    Glucose-Capillary 116 (*)    All other components within normal limits  GLUCOSE, CAPILLARY - Abnormal; Notable for the following:    Glucose-Capillary 138 (*)    All other components within normal limits  GLUCOSE, CAPILLARY - Abnormal; Notable for the following:     Glucose-Capillary 119 (*)    All other components within normal limits  CULTURE, BLOOD (ROUTINE X 2)  CULTURE, BLOOD (ROUTINE X 2)  CSF CULTURE  GRAM STAIN  MRSA PCR SCREENING  MRSA PCR SCREENING  PROCALCITONIN  GLUCOSE, CSF  RPR  MAGNESIUM  PHOSPHORUS  TSH  HIV ANTIBODY (ROUTINE TESTING)  LACTIC ACID, PLASMA  PATHOLOGIST SMEAR REVIEW  CG4 I-STAT (LACTIC ACID)   No results found.   Diagnosis: Fever headache, rule out meningitis and encephalitis    MDM  Patient presents to the ER for evaluation of headache. She reports that it has been present for one week. She says that she has had similar headaches with some type of infection in the past, treated in the ER and discharged with antibiotics and improved. I cannot find reference to this evaluation.  Patient was noted to have a low-grade fever on arrival. She was tachycardic, assumed to be secondary to the fever. Patient was oriented, but somewhat listless and appeared to be uncomfortable. She did not appear septic or toxic, however. Neck examination did not reveal any evidence of meningitis.  CT scan of the head showed no acute abnormality. Because of the patient's fever and headache, I did recommend a lumbar puncture to further evaluate. After discussing risks and benefits, patient did give consent. Lumbar puncture was performed.  Patient was treated empirically for possible meningitis. None of the CSF studies have returned at the time of the end of my shift. Case signed out to oncoming provider, anticipating admission for empiric treatment until CSF cultures return.  Gilda Crease, MD 08/19/12 [phone]

## 2012-08-16 NOTE — ED Notes (Signed)
Pt reports severe headache x 1 week. States that she has taken tramadol for the pain with minimal relief. Reports nausea, blurry vision, and photophobia with the headache. Pt neuro intact, A&O x 4.

## 2012-08-16 NOTE — ED Notes (Signed)
Verified with Antony Madura, PA that pt still needs chest xray. Antony Madura, PA verbalizes that pt will get chest xray. Radiology notified.

## 2012-08-16 NOTE — Progress Notes (Signed)
ANTIBIOTIC CONSULT NOTE - INITIAL  Pharmacy Consult for vancomycin, ceftriaxone, ampicillin Indication: Sepsis/meningitis  No Known Allergies  Patient Measurements: Height: 5' 4.17" (163 cm) Weight: 257 lb 15 oz (117 kg) IBW/kg (Calculated) : 55.1  Vital Signs: Temp: 103.2 F (39.6 C) (06/14 2008) Temp src: Oral (06/14 2008) BP: 117/75 mmHg (06/14 2008) Pulse Rate: 123 (06/14 2008) Intake/Output from previous day:   Intake/Output from this shift:    Labs:  Recent Labs  08/16/12 1946  WBC 7.8  HGB 13.6  PLT 216   Estimated Creatinine Clearance: 61.3 ml/min (by C-G formula based on Cr of 1.6). No results found for this basename: VANCOTROUGH, VANCOPEAK, VANCORANDOM, GENTTROUGH, GENTPEAK, GENTRANDOM, TOBRATROUGH, TOBRAPEAK, TOBRARND, AMIKACINPEAK, AMIKACINTROU, AMIKACIN,  in the last 72 hours   Microbiology: No results found for this or any previous visit (from the past 720 hour(s)).  Medical History: Past Medical History  Diagnosis Date  . Diabetes mellitus   . Hypertension   . Fibromyalgia lupus  . Depression    Assessment: 37 year old female with chief complaint of headache for a week, nausea, vomiting, blurred vision, and photophobia. Fever of 103 noted in ED, wbc is normal. Orders to start empiric antibiotics for rule out sepsis and possible meningitis.  Goal of Therapy:  Vancomycin trough 15-20 Eradication of infection Resolution of symptoms  Plan:  Measure antibiotic drug levels at steady state Follow up culture results Vancomycin total load of 2g then 1g IV q12hours Ampicillin 2g q 4 hours Ceftriaxone 2g q 12 hours  Sheppard Coil PharmD., BCPS Clinical Pharmacist Pager [phone] 08/16/2012 8:47 PM

## 2012-08-16 NOTE — ED Notes (Signed)
Dr Blinda Leatherwood at bedside with LP tray

## 2012-08-16 NOTE — ED Notes (Signed)
Pt reports headache x 1 week. Pt reports entire head hurts, pt has N/V and change on vision. Pt has not taken anything for pain today.

## 2012-08-16 NOTE — ED Notes (Signed)
Wes, RN Rapid Response at bedside

## 2012-08-16 NOTE — ED Provider Notes (Signed)
Patient care assumed from Dr. Blinda Leatherwood at shift change.  CSF significant for white blood cells as well as PMN and mononuclear gram-negative rods. CSF protein elevated to 64. Glucose level within normal limits. IV Vancomycin, Rocephin, and Ampicillin previously ordered by Dr. Blinda Leatherwood. Patient does not have a leukocytosis or elevated lactic acid or procalcitonin level. CT head without acute changes and CXR pending. Temperature 98.622F down from 103.22F since Tylenol administered. Patient also no longer tachycardic. Will consult with Internal Medicine for admission given CSF findings.   Have spoken with Dr. Adela Glimpse who will admit patient for further work up. Have offered to place temp admit orders, but Dr. Adela Glimpse would prefer to evaluate the patient herself before have orders entered.   Results for orders placed during the hospital encounter of 08/16/12  [facility] STAIN      Result Value Range   Specimen Description CSF     Special Requests Normal     Gram Stain       Value: CYTOSPUN SAMPLE     WBC PRESENT,BOTH PMN AND MONONUCLEAR     GRAM NEGATIVE RODS     CRITICAL RESULT CALLED TO, READ BACK BY AND VERIFIED WITHLeotis Shames RICE RN [id number] GREEN R   Report Status 08/16/2012 FINAL    CBC WITH DIFFERENTIAL      Result Value Range   WBC 7.8  4.0 - 10.5 K/uL   RBC 5.10  3.87 - 5.11 MIL/uL   Hemoglobin 13.6  12.0 - 15.0 g/dL   HCT 91.4  78.2 - 95.6 %   MCV 77.8 (*) 78.0 - 100.0 fL   MCH 26.7  26.0 - 34.0 pg   MCHC 34.3  30.0 - 36.0 g/dL   RDW 21.3 (*) 08.6 - 57.8 %   Platelets 216  150 - 400 K/uL   Neutrophils Relative % 80 (*) 43 - 77 %   Neutro Abs 6.2  1.7 - 7.7 K/uL   Lymphocytes Relative 17  12 - 46 %   Lymphs Abs 1.3  0.7 - 4.0 K/uL   Monocytes Relative 3  3 - 12 %   Monocytes Absolute 0.2  0.1 - 1.0 K/uL   Eosinophils Relative 0  0 - 5 %   Eosinophils Absolute 0.0  0.0 - 0.7 K/uL   Basophils Relative 0  0 - 1 %   Basophils Absolute 0.0  0.0 - 0.1 K/uL  BASIC METABOLIC PANEL   Result Value Range   Sodium 132 (*) 135 - 145 mEq/L   Potassium 3.6  3.5 - 5.1 mEq/L   Chloride 97  96 - 112 mEq/L   CO2 24  19 - 32 mEq/L   Glucose, Bld 100 (*) 70 - 99 mg/dL   BUN 12  6 - 23 mg/dL   Creatinine, Ser 4.69  0.50 - 1.10 mg/dL   Calcium 9.0  8.4 - 62.9 mg/dL   GFR calc non Af Amer 72 (*) >90 mL/min   GFR calc Af Amer 84 (*) >90 mL/min  PROCALCITONIN      Result Value Range   Procalcitonin <0.10    CSF CELL COUNT WITH DIFFERENTIAL      Result Value Range   Tube # 1     Color, CSF COLORLESS  COLORLESS   Appearance, CSF HAZY (*) CLEAR   Supernatant NOT INDICATED     RBC Count, CSF 468 (*) 0 /cu mm   WBC, CSF 40 (*) 0 - 5 /cu mm   Segmented Neutrophils-CSF  PENDING  0 - 6 %   Lymphs, CSF PENDING  40 - 80 %   Monocyte-Macrophage-Spinal Fluid PENDING  15 - 45 %   Eosinophils, CSF PENDING  0 - 1 %   Other Cells, CSF PENDING    GLUCOSE, CSF      Result Value Range   Glucose, CSF 49  43 - 76 mg/dL  PROTEIN, CSF      Result Value Range   Total  Protein, CSF 64 (*) 15 - 45 mg/dL  CG4 I-STAT (LACTIC ACID)      Result Value Range   Lactic Acid, Venous 1.89  0.5 - 2.2 mmol/L   Ct Head Wo Contrast  08/16/2012   *RADIOLOGY REPORT*  Clinical Data: Severe headache for the past week.  Nausea.  Blurred vision.  Photophobia.  No reported injury.  CT HEAD WITHOUT CONTRAST  Technique:  Contiguous axial images were obtained from the base of the skull through the vertex without contrast.  Comparison: None.  Findings: There is a crescentic area of increased soft tissue density along the outer margin of the skull posteriorly in the right parietal region.  This measures 2.1 x 0.5 cm in maximum dimensions on image number 24.  This is within the subcutaneous fat.  Normal appearing cerebral hemispheres and posterior fossa structures.  Normal size and position of the ventricles.  No skull fracture, intracranial hemorrhage, mass lesion or CT evidence of acute infarction.  The included paranasal  sinuses are normally pneumatized.  IMPRESSION:  1.  Findings suggesting a small scalp hematoma posteriorly on the right.  A soft tissue mass is less likely. 2.  Otherwise, normal examination.   Original Report Authenticated By: Beckie Salts, M.D.      Antony Madura, PA-C 08/17/12 1844

## 2012-08-16 NOTE — ED Notes (Signed)
Charged notified code sepsis called

## 2012-08-17 ENCOUNTER — Encounter (HOSPITAL_COMMUNITY): Payer: Self-pay | Admitting: Internal Medicine

## 2012-08-17 DIAGNOSIS — J302 Other seasonal allergic rhinitis: Secondary | ICD-10-CM | POA: Diagnosis present

## 2012-08-17 DIAGNOSIS — Z8639 Personal history of other endocrine, nutritional and metabolic disease: Secondary | ICD-10-CM | POA: Diagnosis present

## 2012-08-17 DIAGNOSIS — Z8614 Personal history of Methicillin resistant Staphylococcus aureus infection: Secondary | ICD-10-CM | POA: Diagnosis present

## 2012-08-17 DIAGNOSIS — G009 Bacterial meningitis, unspecified: Secondary | ICD-10-CM | POA: Diagnosis present

## 2012-08-17 DIAGNOSIS — F17210 Nicotine dependence, cigarettes, uncomplicated: Secondary | ICD-10-CM | POA: Diagnosis present

## 2012-08-17 DIAGNOSIS — R509 Fever, unspecified: Secondary | ICD-10-CM

## 2012-08-17 DIAGNOSIS — H53149 Visual discomfort, unspecified: Secondary | ICD-10-CM

## 2012-08-17 DIAGNOSIS — R51 Headache: Secondary | ICD-10-CM

## 2012-08-17 DIAGNOSIS — R634 Abnormal weight loss: Secondary | ICD-10-CM | POA: Diagnosis present

## 2012-08-17 LAB — MAGNESIUM: Magnesium: 2 mg/dL (ref 1.5–2.5)

## 2012-08-17 LAB — COMPREHENSIVE METABOLIC PANEL
AST: 27 U/L (ref 0–37)
Albumin: 2.5 g/dL — ABNORMAL LOW (ref 3.5–5.2)
CO2: 23 mEq/L (ref 19–32)
Calcium: 7.9 mg/dL — ABNORMAL LOW (ref 8.4–10.5)
Creatinine, Ser: 0.93 mg/dL (ref 0.50–1.10)
GFR calc non Af Amer: 78 mL/min — ABNORMAL LOW (ref >90)

## 2012-08-17 LAB — PHOSPHORUS: Phosphorus: 3.6 mg/dL (ref 2.3–4.6)

## 2012-08-17 LAB — CBC
Hemoglobin: 12.7 g/dL (ref 12.0–15.0)
MCH: 26.8 pg (ref 26.0–34.0)
RBC: 4.74 MIL/uL (ref 3.87–5.11)

## 2012-08-17 LAB — MRSA PCR SCREENING: MRSA by PCR: NEGATIVE

## 2012-08-17 LAB — TSH: TSH: 0.531 u[IU]/mL (ref 0.350–4.500)

## 2012-08-17 LAB — LACTIC ACID, PLASMA: Lactic Acid, Venous: 1.2 mmol/L (ref 0.5–2.2)

## 2012-08-17 LAB — HIV ANTIBODY (ROUTINE TESTING W REFLEX): HIV: NONREACTIVE

## 2012-08-17 MED ORDER — INSULIN ASPART 100 UNIT/ML ~~LOC~~ SOLN
0.0000 [IU] | Freq: Three times a day (TID) | SUBCUTANEOUS | Status: DC
  Administered 2012-08-17: 1 [IU] via SUBCUTANEOUS

## 2012-08-17 MED ORDER — PANTOPRAZOLE SODIUM 40 MG IV SOLR
40.0000 mg | Freq: Every day | INTRAVENOUS | Status: DC
  Filled 2012-08-17: qty 40

## 2012-08-17 MED ORDER — SODIUM CHLORIDE 0.9 % IJ SOLN
3.0000 mL | Freq: Two times a day (BID) | INTRAMUSCULAR | Status: DC
  Administered 2012-08-17 (×2): 3 mL via INTRAVENOUS

## 2012-08-17 MED ORDER — HYDROCODONE-ACETAMINOPHEN 5-325 MG PO TABS
1.0000 | ORAL_TABLET | ORAL | Status: DC | PRN
  Administered 2012-08-17 – 2012-08-18 (×3): 2 via ORAL
  Administered 2012-08-18: 1 via ORAL
  Administered 2012-08-18 – 2012-08-19 (×2): 2 via ORAL
  Filled 2012-08-17 (×5): qty 2
  Filled 2012-08-17: qty 1
  Filled 2012-08-17: qty 2

## 2012-08-17 MED ORDER — ONDANSETRON HCL 4 MG/2ML IJ SOLN: 4.0000 mg | Freq: Four times a day (QID) | INTRAMUSCULAR | Status: DC | PRN

## 2012-08-17 MED ORDER — ONDANSETRON HCL 4 MG PO TABS: 4.0000 mg | ORAL_TABLET | Freq: Four times a day (QID) | ORAL | Status: DC | PRN

## 2012-08-17 MED ORDER — SODIUM CHLORIDE 0.9 % IV SOLN
INTRAVENOUS | Status: DC
  Administered 2012-08-17 – 2012-08-18 (×2): via INTRAVENOUS
  Administered 2012-08-19: 50 mL/h via INTRAVENOUS

## 2012-08-17 MED ORDER — PANTOPRAZOLE SODIUM 40 MG PO TBEC
40.0000 mg | DELAYED_RELEASE_TABLET | Freq: Every day | ORAL | Status: DC
  Administered 2012-08-17 – 2012-08-19 (×3): 40 mg via ORAL
  Filled 2012-08-17 (×3): qty 1

## 2012-08-17 MED ORDER — ALPRAZOLAM 0.5 MG PO TABS
1.0000 mg | ORAL_TABLET | Freq: Every evening | ORAL | Status: DC | PRN
  Administered 2012-08-18 (×2): 1 mg via ORAL
  Filled 2012-08-17 (×2): qty 2
  Filled 2012-08-17: qty 1

## 2012-08-17 MED ORDER — SODIUM CHLORIDE 0.9 % IV SOLN
INTRAVENOUS | Status: AC
  Administered 2012-08-17: 02:00:00 via INTRAVENOUS

## 2012-08-17 MED ORDER — ACETAMINOPHEN 325 MG PO TABS
650.0000 mg | ORAL_TABLET | Freq: Four times a day (QID) | ORAL | Status: DC | PRN
  Administered 2012-08-17 – 2012-08-19 (×2): 650 mg via ORAL
  Filled 2012-08-17 (×2): qty 2

## 2012-08-17 MED ORDER — ACETAMINOPHEN 650 MG RE SUPP: 650.0000 mg | Freq: Four times a day (QID) | RECTAL | Status: DC | PRN

## 2012-08-17 NOTE — Consult Note (Signed)
[facility]    Date of Admission:  08/16/2012           Day 1 vancomycin        Day 1 ceftriaxone        Day 1 ampicillin       Reason for Consult: Bacterial meningitis    Referring Physician: Dr. Reather Littler  Principal Problem:   Meningitis due to bacteria Active Problems:   Anxiety and depression   HTN (hypertension)   Bipolar I disorder, most recent episode (or current) depressed, unspecified   History of diabetes mellitus   Weight loss   Seasonal allergies   History of MRSA infection   Cigarette smoker   . ampicillin (OMNIPEN) IV  2 g Intravenous Q4H  . cefTRIAXone (ROCEPHIN)  IV  2 g Intravenous Q12H  . insulin aspart  0-9 Units Subcutaneous TID WC  . pantoprazole (PROTONIX) IV  40 mg Intravenous QHS  . sodium chloride  3 mL Intravenous Q12H  . vancomycin  1,000 mg Intravenous Once  . vancomycin  1,000 mg Intravenous Q12H    Recommendations: 1. Continue vancomycin and ceftriaxone pending final CSF and blood cultures 2. Discontinue ampicillin 3. Continue droplet precautions for the first 24 hours of antibiotic therapy   Assessment: Her presentation and CSF results do support a diagnosis of bacterial meningitis. Her Gram stain results are somewhat confusing. He microbiology tech last night reported gram-negative rods. The tech at [facility] today confirmed gram-negative rods on stain. I reviewed the slide that is available here are and it shows organisms of varying morphology with a predominance of gram-positive cocci. She did get one dose of dexamethasone in the emergency department before antibiotics were started according to her admission note. I will not continue dexamethasone because of her dramatic improvement and the uncertainty as to the etiologic organism. I will continue vancomycin and ceftriaxone pending final cultures. I do not feel she needs additional coverage for possible listeria.    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 She noticed that she had a lot of phlegm in her throat and was having to clear her throat frequently. She also began to have global headaches have gotten progressively worse over the past few days. She's been having episodes of shaking chills and drenching sweats. Yesterday she said that the headaches became so bad "she could not think straight". She says that she was able to drive herself to the emergency department but that she had to stop frequently and it took her 2 hours to get here. She says she recalls being confused. She underwent a tubal ligation in March but had been in good health and had no problem recovering from that surgery. She's lost about 65 pounds intentionally since the birth of her daughter last year and has been able to stop taking medications for diabetes.   Review of Systems: Constitutional: positive for chills, fevers, sweats and weight loss, negative for anorexia Eyes: positive for photophobia and blurry vision, negative for irritation and redness Ears, nose, mouth, throat, and face: positive for excess phlegm, negative for earaches, hearing loss, sore mouth and sore throat Respiratory: negative Cardiovascular: negative Gastrointestinal: negative Genitourinary:negative Integument/breast: negative for rash Musculoskeletal:positive for myalgias, negative for neck pain Neurological: positive for headaches and transient confusion, negative for coordination problems, dizziness, gait problems, seizures  and weakness  Past Medical History  Diagnosis Date  . Diabetes mellitus   . Hypertension   . Fibromyalgia lupus  . Depression     History  Substance Use Topics  . Smoking status: Current Every Day Smoker -- 0.25 packs/day for 10 years    Types: Cigarettes  . Smokeless tobacco: Never Used  . Alcohol Use: No    Family History  Problem Relation Age of Onset  .  Hypertension Father   . Diabetes Father   . Drug abuse Father   . Bipolar disorder Sister    No Known Allergies  OBJECTIVE: Blood pressure 118/74, pulse 61, temperature 97.8 F (36.6 C), temperature source Oral, resp. rate 17, height 5' 4.17" (1.63 m), weight 117 kg (257 lb 15 oz), SpO2 100.00%. General: She is alert, talkative and in good spirits Neck: Supple Oral: No oropharyngeal lesions Lymph nodes: Some nontender anterior cervical lymph nodes are palpable Skin: No rash Lungs: Clear Cor: Regular S1 and S2 with no murmurs Abdomen: Obese, soft and nontender Joints and extremities: Normal Neurologic: Alert and fully oriented. Speech is normal. His normal strength in all extremities Mood and affect: Normal  Labs: CSF: White blood cells 40 with 50% segmented neutrophils and 37% lymphocytes  Red blood cells 468  Protein 64  Glucose 49 with a simultaneous serum glucose of 100  Gram stain reported to show gram-negative rods although the slide that I reviewed showed  some gram-positive cocci in pairs with varying morphology in addition to some rare  gram-negative organisms  There is no completed note for the procedure and I cannot find any documentation of her  opening pressure  Microbiology: Recent Results (from the past 240 hour(s))  CSF CULTURE     Status: None   Collection Time    08/16/12  8:47 PM      Result Value Range Status   Specimen Description CSF   Final   Special Requests NONE   Final   Gram Stain     Final   Value: CYTOSPIN ABUNDANT WBC PRESENT,BOTH PMN AND MONONUCLEAR     GRAM NEGATIVE RODS     Performed at [facility] CRITICAL RESULT CALLED TO, READ BACK BY AND VERIFIED WITH: CRITICAL RESULT CALLED TO, READ BACK BY AND VERIFIED WITHShepard General RN [id number] GREEN R   Culture PENDING   Incomplete   Report Status PENDING   Incomplete  GRAM STAIN     Status: None   Collection Time    08/16/12  8:47 PM      Result Value Range Status   Specimen  Description CSF   Final   Special Requests Normal   Final   Gram Stain     Final   Value: CYTOSPUN SAMPLE     WBC PRESENT,BOTH PMN AND MONONUCLEAR     GRAM NEGATIVE RODS     CRITICAL RESULT CALLED TO, READ BACK BY AND VERIFIED WITHShepard General RN [id number] 2205 GREEN R   Report Status 08/16/2012 FINAL   Final  MRSA PCR SCREENING     Status: None   Collection Time    08/17/12  4:00 AM      Result Value Range Status   MRSA by PCR NEGATIVE  NEGATIVE Final   Comment:            The GeneXpert MRSA Assay (FDA     approved for NASAL specimens     only), is one component of a  comprehensive MRSA colonization     surveillance program. It is not     intended to diagnose MRSA     infection nor to guide or     monitor treatment for     MRSA infections.  MRSA PCR SCREENING     Status: None   Collection Time    08/17/12  7:33 AM      Result Value Range Status   MRSA by PCR NEGATIVE  NEGATIVE Final   Comment:            The GeneXpert MRSA Assay (FDA     approved for NASAL specimens     only), is one component of a     comprehensive MRSA colonization     surveillance program. It is not     intended to diagnose MRSA     infection nor to guide or     monitor treatment for     MRSA infections.    Cliffton Asters, MD [facility] [phone] pager   [phone] cell 08/17/2012, 2:24 PM

## 2012-08-17 NOTE — Progress Notes (Signed)
[facility] Progress Note [place] TEAM 1 - Stepdown/ICU TEAM   Virginia Simmons UJW:[id number] DOB: [birth year] DOA: 08/16/2012 PCP: Erlinda Hong, MD  Brief narrative: 37 y.o. female w/ a medical history of Diabetes mellitus; Hypertension; Bipolar D/O; Fibromyalgia; and Depression who presented with a one week history of "not feeling well."  She endorsed headache, fever, nausea and vomiting. Unfortunately patient was initially confused and unable to provide further detailed history.  An LP was done in the ED.  The Gram stain showed gram-negative rods and 40 white blood cells. She was given 10 of Decadron and started on ampicillin, vancomycin, and ceftriaxone.   Assessment/Plan:  Bacterial meningitis  no recent neurosurgical surgeries - Tmax to 103, but has defervesced since admit - GRAM NEGATIVE RODS on CSF gram stain - pt reports sore "on my butt" about a week ago, w/ exam revealing only a mild stage II breakdown at apex of gluteal cleft  S/p Laparoscopic tubal cauterization 05/28/2012  DM  HTN  Bipolar D/O  Fibromyalgia  Code Status: FULL Family Communication: spoke directly w/ pt Disposition Plan: SDU  Consultants: ID  Procedures: 6/14 - LP  Antibiotics: Rocephin  Ampicillin Vanc  DVT prophylaxis: SCDs  HPI/Subjective: Pt seen for f/u visit   Objective: Blood pressure 118/74, pulse 67, temperature 97.8 F (36.6 C), temperature source Oral, resp. rate 15, height 5' 4.17" (1.63 m), weight 117 kg (257 lb 15 oz), SpO2 100.00%.  Intake/Output Summary (Last 24 hours) at 08/17/12 1219 Last data filed at 08/17/12 1100  Gross per 24 hour  Intake   1410 ml  Output      0 ml  Net   1410 ml    Exam: F/U exam completed  Data Reviewed: Basic Metabolic Panel:  Recent Labs Lab 08/16/12 1946 08/17/12 0620  NA 132* 136  K 3.6 3.6  CL 97 102  CO2 24 23  GLUCOSE 100* 222*  BUN 12 14  CREATININE 0.99 0.93  CALCIUM 9.0 7.9*  MG  --  2.0  PHOS  --   3.6   Liver Function Tests:  Recent Labs Lab 08/17/12 0620  AST 27  ALT 16  ALKPHOS 51  BILITOT 0.2*  PROT 6.9  ALBUMIN 2.5*   CBC:  Recent Labs Lab 08/16/12 1946 08/17/12 0620  WBC 7.8 6.1  NEUTROABS 6.2  --   HGB 13.6 12.7  HCT 39.7 37.5  MCV 77.8* 79.1  PLT 216 203   CBG: No results found for this basename: GLUCAP,  in the last 168 hours  Recent Results (from the past 240 hour(s))  GRAM STAIN     Status: None   Collection Time    08/16/12  8:47 PM      Result Value Range Status   Specimen Description CSF   Final   Special Requests Normal   Final   Gram Stain     Final   Value: CYTOSPUN SAMPLE     WBC PRESENT,BOTH PMN AND MONONUCLEAR     GRAM NEGATIVE RODS     CRITICAL RESULT CALLED TO, READ BACK BY AND VERIFIED WITHShepard General RN [id number] GREEN R   Report Status 08/16/2012 FINAL   Final  MRSA PCR SCREENING     Status: None   Collection Time    08/17/12  4:00 AM      Result Value Range Status   MRSA by PCR NEGATIVE  NEGATIVE Final   Comment:  The GeneXpert MRSA Assay (FDA     approved for NASAL specimens     only), is one component of a     comprehensive MRSA colonization     surveillance program. It is not     intended to diagnose MRSA     infection nor to guide or     monitor treatment for     MRSA infections.  MRSA PCR SCREENING     Status: None   Collection Time    08/17/12  7:33 AM      Result Value Range Status   MRSA by PCR NEGATIVE  NEGATIVE Final   Comment:            The GeneXpert MRSA Assay (FDA     approved for NASAL specimens     only), is one component of a     comprehensive MRSA colonization     surveillance program. It is not     intended to diagnose MRSA     infection nor to guide or     monitor treatment for     MRSA infections.     Studies:  Recent x-ray studies have been reviewed in detail by the Attending Physician  Scheduled Meds:  Scheduled Meds: . ampicillin (OMNIPEN) IV  2 g Intravenous Q4H  .  cefTRIAXone (ROCEPHIN)  IV  2 g Intravenous Q12H  . pantoprazole (PROTONIX) IV  40 mg Intravenous QHS  . sodium chloride  3 mL Intravenous Q12H  . vancomycin  1,000 mg Intravenous Once  . vancomycin  1,000 mg Intravenous Q12H    Time spent on care of this patient: 25+ mins   [facility] T  [facility] Office  [phone] Pager - Text Page per Loretha Stapler as per below:  On-Call/Text Page:      [email or web address]      password [email or web address]  If 7PM-7AM, please contact night-coverage [email or web address] Password [email or web address] 08/17/2012, 12:19 PM   LOS: 1 day

## 2012-08-17 NOTE — H&P (Signed)
PCP:  [facility], MD    Chief Complaint:  Headache and fever  HPI: Virginia Simmons is a 37 y.o. female   has a past medical history of Diabetes mellitus; Hypertension; Fibromyalgia (lupus); and Depression.   Presented with  Patient reports one week history of not feeling well. She endorses headache, fevers, nausea and vomiting. Unfortunately patient is confused and unable to provide detailed history. She states she's not taking any medications. She has history of bipolar disorder. An LP was done in emergency department giving confusion headache and fever. The Gram stain showed gram-negative rods and 40 white blood cell. She was given 10 of Decadron started on ampicillin,  Vancomycin, ceftriaxone and hospitalist was called on admission. She denies any meningismus. She remains still altered but states she's feeling much better. She denies any sick contacts.  Review of Systems:   Pertinent positives include: Fevers, chills, fatigue, headaches, nausea, vomiting,   Constitutional:  No weight loss, night sweats weight loss  HEENT:  No Difficulty swallowing,Tooth/dental problems,Sore throat,  No sneezing, itching, ear ache, nasal congestion, post nasal drip,  Cardio-vascular:  No chest pain, Orthopnea, PND, anasarca, dizziness, palpitations.no Bilateral lower extremity swelling  GI:  No heartburn, indigestion, abdominal pain,diarrhea, change in bowel habits, loss of appetite, melena, blood in stool, hematemesis Resp:  no shortness of breath at rest. No dyspnea on exertion, No excess mucus, no productive cough, No non-productive cough, No coughing up of blood.No change in color of mucus.No wheezing. Skin:  no rash or lesions. No jaundice GU:  no dysuria, change in color of urine, no urgency or frequency. No straining to urinate.  No flank pain.  Musculoskeletal:  No joint pain or no joint swelling. No decreased range of motion. No back pain.  Psych:  No change in mood or affect. No  depression or anxiety. No memory loss.  Neuro: no localizing neurological complaints, no tingling, no weakness, no double vision, no gait abnormality, no slurred speech, no confusion  Otherwise ROS are negative except for above, 10 systems were reviewed  Past Medical History: Past Medical History  Diagnosis Date  . Diabetes mellitus   . Hypertension   . Fibromyalgia lupus  . Depression    Past Surgical History  Procedure Laterality Date  . Hand surgery    . Foot fracture surgery    . Cesarean section  08/30/2011    Procedure: CESAREAN SECTION;  Surgeon: Bing Plume, MD;  Location: [facility];  Service: Gynecology;  Laterality: N/A;  . Foot surgery      left   . Hand surgery      left  . Cesarean section    . Laparoscopic tubal ligation N/A 05/28/2012    Procedure: LAPAROSCOPIC TUBAL LIGATION;  Surgeon: Bing Plume, MD;  Location: [facility];  Service: Gynecology;  Laterality: N/A;  laparoscopic tubal cauterization     Medications: Prior to Admission medications   Medication Sig Start Date End Date Taking? Authorizing Provider  ALPRAZolam Prudy Feeler) 1 MG tablet Take 1 mg by mouth at bedtime as needed for sleep.   Yes Historical Provider, MD  fexofenadine (ALLEGRA) 180 MG tablet Take 1 tablet (180 mg total) by mouth daily. 10/28/11 10/27/12 Yes Linna Hoff, MD  fluticasone (FLONASE) 50 MCG/ACT nasal spray Place 1 spray into the nose 2 (two) times daily. 10/28/11 10/27/12 Yes Linna Hoff, MD  ibuprofen (ADVIL,MOTRIN) 800 MG tablet Take 1 tablet (800 mg total) by mouth 3 (three) times daily. 04/01/12  Yes Fayrene Fearing  Sallyanne Kuster, MD    Allergies:  No Known Allergies  Social History:  Ambulatory  independently  Lives at home, social station is unclear given the patient is confused she is unable to provide more detailed   reports that she has been smoking Cigarettes.  She has a 2.5 pack-year smoking history. She has never used smokeless tobacco. She reports that she does not drink alcohol or  use illicit drugs.   Family History: family history includes Bipolar disorder in her sister; Diabetes in her father; Drug abuse in her father; and Hypertension in her father.    Physical Exam: Patient Vitals for the past 24 hrs:  BP Temp Temp src Pulse Resp SpO2 Height Weight  08/17/12 0053 102/59 mmHg 98 F (36.7 C) Oral 88 20 - - -  08/16/12 2330 117/81 mmHg - - 102 2 99 % - -  08/16/12 2315 117/87 mmHg - - 103 0 99 % - -  08/16/12 2215 117/75 mmHg - - 99 25 99 % - -  08/16/12 2202 119/80 mmHg 98.9 F (37.2 C) Oral 99 24 99 % - -  08/16/12 2200 119/80 mmHg - - 98 24 100 % - -  08/16/12 2130 - - - 100 - 99 % - -  08/16/12 2115 111/63 mmHg - - 100 - 100 % - -  08/16/12 2100 - - - 112 19 100 % - -  08/16/12 2045 116/84 mmHg - - 119 19 100 % - -  08/16/12 2030 137/80 mmHg - - 126 28 100 % - -  08/16/12 2015 120/79 mmHg - - 118 19 100 % - -  08/16/12 2013 - - - - - - 5' 4.17" (1.63 m) 117 kg (257 lb 15 oz)  08/16/12 2008 117/75 mmHg 103.2 F (39.6 C) Oral 123 11 98 % - -  08/16/12 1729 109/60 mmHg 101.1 F (38.4 C) Oral 125 24 100 % - -    1. General:  in No Acute distress 2. Psychological: Alert  Oriented to self but not time or situation 3. Head/ENT:    Dry Mucous Membranes                          Head Non traumatic, neck supple able to move all around                          Normal  Dentition 4. SKIN:  decreased Skin turgor,  Skin clean Dry and intact, without macules noted on the bottom of her feet left worse than right. 5. Heart: Regular rate and rhythm no Murmur, Rub or gallop 6. Lungs: Clear to auscultation bilaterally, no wheezes or crackles   7. Abdomen: Soft, non-tender, Non distended obese 8. Lower extremities: no clubbing, cyanosis, or edema 9. Neurologically Grossly intact, moving all 4 extremities equally not fully cooperative 10. MSK: Normal range of motion  body mass index is 44.04 kg/(m^2).   Labs on Admission:   Recent Labs  08/16/12 1946  NA 132*   K 3.6  CL 97  CO2 24  GLUCOSE 100*  BUN 12  CREATININE 0.99  CALCIUM 9.0   No results found for this basename: AST, ALT, ALKPHOS, BILITOT, PROT, ALBUMIN,  in the last 72 hours No results found for this basename: LIPASE, AMYLASE,  in the last 72 hours  Recent Labs  08/16/12 1946  WBC 7.8  NEUTROABS 6.2  HGB 13.6  HCT 39.7  MCV 77.8*  PLT 216   No results found for this basename: CKTOTAL, CKMB, CKMBINDEX, TROPONINI,  in the last 72 hours No results found for this basename: TSH, T4TOTAL, FREET3, T3FREE, THYROIDAB,  in the last 72 hours No results found for this basename: VITAMINB12, FOLATE, FERRITIN, TIBC, IRON, RETICCTPCT,  in the last 72 hours No results found for this basename: HGBA1C    Estimated Creatinine Clearance: 99.1 ml/min (by C-G formula based on Cr of 0.99). ABG    Component Value Date/Time   TCO2 29 10/31/2011 1632     No results found for this basename: DDIMER      Cultures:    Component Value Date/Time   SDES CSF 08/16/2012 2047   SPECREQUEST Normal 08/16/2012 2047   CULT  Value: FEW METHICILLIN RESISTANT STAPHYLOCOCCUS AUREUS Note: RIFAMPIN AND GENTAMICIN SHOULD NOT BE USED AS SINGLE DRUGS FOR TREATMENT OF STAPH INFECTIONS. This organism DOES NOT demonstrate inducible Clindamycin resistance in vitro. CRITICAL RESULT CALLED TO, READ BACK BY AND VERIFIED WITH: SAVANA 08/30/09  AT 820 AM BY [facility] 08/26/2009 1102   REPTSTATUS 08/16/2012 FINAL 08/16/2012 2047       Radiological Exams on Admission: Dg Chest 2 View  08/16/2012   *RADIOLOGY REPORT*  Clinical Data: Headache, cough, fever  CHEST - 2 VIEW  Comparison: None.  Findings: Low lung volumes.  Mild cardiomegaly.  No focal infiltrate or overt edema.  No effusion.  Regional bones unremarkable.  IMPRESSION:  1.  Mild cardiomegaly.   Original Report Authenticated By: D. Andria Rhein, MD   Ct Head Wo Contrast  08/16/2012   *RADIOLOGY REPORT*  Clinical Data: Severe headache for the past week.  Nausea.  Blurred  vision.  Photophobia.  No reported injury.  CT HEAD WITHOUT CONTRAST  Technique:  Contiguous axial images were obtained from the base of the skull through the vertex without contrast.  Comparison: None.  Findings: There is a crescentic area of increased soft tissue density along the outer margin of the skull posteriorly in the right parietal region.  This measures 2.1 x 0.5 cm in maximum dimensions on image number 24.  This is within the subcutaneous fat.  Normal appearing cerebral hemispheres and posterior fossa structures.  Normal size and position of the ventricles.  No skull fracture, intracranial hemorrhage, mass lesion or CT evidence of acute infarction.  The included paranasal sinuses are normally pneumatized.  IMPRESSION:  1.  Findings suggesting a small scalp hematoma posteriorly on the right.  A soft tissue mass is less likely. 2.  Otherwise, normal examination.   Original Report Authenticated By: Beckie Salts, M.D.    Chart has been reviewed  Assessment/Plan This is a 37 year old female with history of bipolar disorder presents with headache and fever she states for about a week. LP done in emergency department worrisome for bacterial meningitis giving gram-negative rods on Gram stain.  Present on Admission:  . Meningitis due to bacteria - no admit to step down, continue broad-spectrum antibiotics until cultures are available. clinical presentation  somewhat atypical for bacterial meningitis given durations of her symptoms. She has no recent neurosurgical surgeries. Will also obtain HIV serologies add HSV and VDRL to CSF. ID consult in a.m. for results of blood culture. Obtain UA and urine culture. Droplet precautions . Bipolar I disorder, most recent episode (or current) depressed, unspecified - she is currently not taking any medications. Will watch her mental status once her acute illness improves   Prophylaxis: SCD, Protonix  CODE  STATUS: Full code  Other plan as per orders.  I have  spent a total of 60 min on this admission  Virginia Simmons 08/17/2012, 1:10 AM

## 2012-08-17 NOTE — Progress Notes (Signed)
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 She states she will follow up on situation.

## 2012-08-18 DIAGNOSIS — B9689 Other specified bacterial agents as the cause of diseases classified elsewhere: Secondary | ICD-10-CM

## 2012-08-18 DIAGNOSIS — I10 Essential (primary) hypertension: Secondary | ICD-10-CM

## 2012-08-18 DIAGNOSIS — G008 Other bacterial meningitis: Secondary | ICD-10-CM

## 2012-08-18 DIAGNOSIS — G009 Bacterial meningitis, unspecified: Principal | ICD-10-CM

## 2012-08-18 DIAGNOSIS — F3130 Bipolar disorder, current episode depressed, mild or moderate severity, unspecified: Secondary | ICD-10-CM

## 2012-08-18 DIAGNOSIS — Z862 Personal history of diseases of the blood and blood-forming organs and certain disorders involving the immune mechanism: Secondary | ICD-10-CM

## 2012-08-18 DIAGNOSIS — J069 Acute upper respiratory infection, unspecified: Secondary | ICD-10-CM

## 2012-08-18 LAB — PATHOLOGIST SMEAR REVIEW: Path Review: INCREASED

## 2012-08-18 LAB — BASIC METABOLIC PANEL
BUN: 17 mg/dL (ref 6–23)
Calcium: 8.2 mg/dL — ABNORMAL LOW (ref 8.4–10.5)
GFR calc Af Amer: >90 mL/min (ref >90)
GFR calc non Af Amer: >90 mL/min (ref >90)
Glucose, Bld: 141 mg/dL — ABNORMAL HIGH (ref 70–99)
Potassium: 3.9 mEq/L (ref 3.5–5.1)
Sodium: 137 mEq/L (ref 135–145)

## 2012-08-18 LAB — CBC
HCT: 34.2 % — ABNORMAL LOW (ref 36.0–46.0)
Hemoglobin: 11.4 g/dL — ABNORMAL LOW (ref 12.0–15.0)
MCH: 26.6 pg (ref 26.0–34.0)
MCHC: 33.3 g/dL (ref 30.0–36.0)
RDW: 16.5 % — ABNORMAL HIGH (ref 11.5–15.5)

## 2012-08-18 LAB — GLUCOSE, CAPILLARY
Glucose-Capillary: 110 mg/dL — ABNORMAL HIGH (ref 70–99)
Glucose-Capillary: 116 mg/dL — ABNORMAL HIGH (ref 70–99)

## 2012-08-18 LAB — HEMOGLOBIN A1C
Hgb A1c MFr Bld: 6.1 % — ABNORMAL HIGH (ref <5.7)
Mean Plasma Glucose: 128 mg/dL — ABNORMAL HIGH (ref <117)

## 2012-08-18 MED ORDER — FLUTICASONE PROPIONATE 50 MCG/ACT NA SUSP
1.0000 | Freq: Two times a day (BID) | NASAL | Status: DC
  Administered 2012-08-18 – 2012-08-19 (×2): 1 via NASAL
  Filled 2012-08-18 (×2): qty 16

## 2012-08-18 MED ORDER — LORATADINE 10 MG PO TABS
10.0000 mg | ORAL_TABLET | Freq: Every day | ORAL | Status: DC
  Administered 2012-08-18 – 2012-08-19 (×2): 10 mg via ORAL
  Filled 2012-08-18 (×3): qty 1

## 2012-08-18 NOTE — Progress Notes (Signed)
[facility] PROGRESS NOTE  Virginia Simmons ZOX:[id number] DOB: [birth year] DOA: 08/16/2012 PCP: Erlinda Hong, MD  Assessment/Plan: #1 bacterial meningitis Clinical improvement.blood cultures were no growth to date. CSF cultures pending.gram-negative rods were found on Gram stain. Ampicillin was discontinued by ID yesterday.continue empiric IV vancomycin and Rocephin. ID following and appreciate input and recommendations.  #2hypertension Stable.  #3 well-controlled diabetes mellitus CBGs have ranged from 110-141.hemoglobin A1c 6.1.continue sliding scale insulin.  #4 bipolar disorder Stable.  #5 prophylaxis PPI for GI prophylaxis. SCDs for DVT prophylaxis.  Code Status: full. Family Communication: updated patient no family present. Disposition Plan: home on medically stable.   Consultants:  Infectious diseases: Dr. Orvan Falconer 08/17/2012  Procedures:  LP 08/16/2012  Antibiotics:  IV Vancomycin 08/16/2012  IV Rocephin 08/16/2012  IV ampicillin and 08/16/2012 20 08/17/2012  HPI/Subjective: Patient states feeling much better.Marland Kitchen HA improved.  Objective: Filed Vitals:   08/17/12 1600 08/17/12 2100 08/18/12 0649 08/18/12 1500  BP:  125/86 116/81 108/86  Pulse:  67 55 70  Temp:  97.8 F (36.6 C) 97.6 F (36.4 C) 97.9 F (36.6 C)  TempSrc:  Oral Oral Oral  Resp: 23 20 19    Height:      Weight:      SpO2:  100% 100% 100%    Intake/Output Summary (Last 24 hours) at 08/18/12 1756 Last data filed at 08/18/12 1400  Gross per 24 hour  Intake   1800 ml  Output      0 ml  Net   1800 ml   Filed Weights   08/16/12 2013  Weight: 117 kg (257 lb 15 oz)    Exam:   General:  NAD  Cardiovascular: RRR  Respiratory: CTAB  Abdomen: Soft/NT/ND/+BS  EXTREMITIES: No c/c/e  Data Reviewed: Basic Metabolic Panel:  Recent Labs Lab 08/16/12 1946 08/17/12 0620 08/18/12 0545  NA 132* 136 137  K 3.6 3.6 3.9  CL 97 102 107  CO2 24 23 24   GLUCOSE 100* 222*  141*  BUN 12 14 17   CREATININE 0.99 0.93 0.80  CALCIUM 9.0 7.9* 8.2*  MG  --  2.0  --   PHOS  --  3.6  --    Liver Function Tests:  Recent Labs Lab 08/17/12 0620  AST 27  ALT 16  ALKPHOS 51  BILITOT 0.2*  PROT 6.9  ALBUMIN 2.5*   No results found for this basename: LIPASE, AMYLASE,  in the last 168 hours No results found for this basename: AMMONIA,  in the last 168 hours CBC:  Recent Labs Lab 08/16/12 1946 08/17/12 0620 08/18/12 0545  WBC 7.8 6.1 9.5  NEUTROABS 6.2  --   --   HGB 13.6 12.7 11.4*  HCT 39.7 37.5 34.2*  MCV 77.8* 79.1 79.7  PLT 216 203 229   Cardiac Enzymes: No results found for this basename: CKTOTAL, CKMB, CKMBINDEX, TROPONINI,  in the last 168 hours BNP (last 3 results) No results found for this basename: PROBNP,  in the last 8760 hours CBG:  Recent Labs Lab 08/16/12 1821 08/17/12 1749 08/17/12 2208 08/18/12 0824 08/18/12 1252  GLUCAP 111* 125* 152* 110* 110*    Recent Results (from the past 240 hour(s))  CULTURE, BLOOD (ROUTINE X 2)     Status: None   Collection Time    08/16/12  8:05 PM      Result Value Range Status   Specimen Description BLOOD LEFT ARM   Final   Special Requests BOTTLES DRAWN AEROBIC ONLY 10CC   Final  Culture  Setup Time 08/17/2012 02:21   Final   Culture     Final   Value:        BLOOD CULTURE RECEIVED NO GROWTH TO DATE CULTURE WILL BE HELD FOR 5 DAYS BEFORE ISSUING A FINAL NEGATIVE REPORT   Report Status PENDING   Incomplete  CULTURE, BLOOD (ROUTINE X 2)     Status: None   Collection Time    08/16/12  8:15 PM      Result Value Range Status   Specimen Description BLOOD RIGHT HAND   Final   Special Requests     Final   Value: BOTTLES DRAWN AEROBIC AND ANAEROBIC 10CC BLUE 6CC RED   Culture  Setup Time 08/17/2012 02:21   Final   Culture     Final   Value:        BLOOD CULTURE RECEIVED NO GROWTH TO DATE CULTURE WILL BE HELD FOR 5 DAYS BEFORE ISSUING A FINAL NEGATIVE REPORT   Report Status PENDING   Incomplete   CSF CULTURE     Status: None   Collection Time    08/16/12  8:47 PM      Result Value Range Status   Specimen Description CSF   Final   Special Requests NONE   Final   Gram Stain     Final   Value: CYTOSPIN ABUNDANT WBC PRESENT,BOTH PMN AND MONONUCLEAR     GRAM NEGATIVE RODS     Performed at [facility] CRITICAL RESULT CALLED TO, READ BACK BY AND VERIFIED WITH: CRITICAL RESULT CALLED TO, READ BACK BY AND VERIFIED WITHLeotis Shames RICE RN [id number] GREEN R   Culture NO GROWTH 1 DAY   Final   Report Status PENDING   Incomplete  GRAM STAIN     Status: None   Collection Time    08/16/12  8:47 PM      Result Value Range Status   Specimen Description CSF   Final   Special Requests Normal   Final   Gram Stain     Final   Value: CYTOSPUN SAMPLE     WBC PRESENT,BOTH PMN AND MONONUCLEAR     GRAM NEGATIVE RODS     CRITICAL RESULT CALLED TO, READ BACK BY AND VERIFIED WITHShepard General RN [id number] GREEN R   Report Status 08/16/2012 FINAL   Final  MRSA PCR SCREENING     Status: None   Collection Time    08/17/12  4:00 AM      Result Value Range Status   MRSA by PCR NEGATIVE  NEGATIVE Final   Comment:            The GeneXpert MRSA Assay (FDA     approved for NASAL specimens     only), is one component of a     comprehensive MRSA colonization     surveillance program. It is not     intended to diagnose MRSA     infection nor to guide or     monitor treatment for     MRSA infections.  MRSA PCR SCREENING     Status: None   Collection Time    08/17/12  7:33 AM      Result Value Range Status   MRSA by PCR NEGATIVE  NEGATIVE Final   Comment:            The GeneXpert MRSA Assay (FDA     approved for NASAL specimens     only), is  one component of a     comprehensive MRSA colonization     surveillance program. It is not     intended to diagnose MRSA     infection nor to guide or     monitor treatment for     MRSA infections.     Studies: Dg Chest 2 View  08/16/2012    *RADIOLOGY REPORT*  Clinical Data: Headache, cough, fever  CHEST - 2 VIEW  Comparison: None.  Findings: Low lung volumes.  Mild cardiomegaly.  No focal infiltrate or overt edema.  No effusion.  Regional bones unremarkable.  IMPRESSION:  1.  Mild cardiomegaly.   Original Report Authenticated By: D. Andria Rhein, MD   Ct Head Wo Contrast  08/16/2012   *RADIOLOGY REPORT*  Clinical Data: Severe headache for the past week.  Nausea.  Blurred vision.  Photophobia.  No reported injury.  CT HEAD WITHOUT CONTRAST  Technique:  Contiguous axial images were obtained from the base of the skull through the vertex without contrast.  Comparison: None.  Findings: There is a crescentic area of increased soft tissue density along the outer margin of the skull posteriorly in the right parietal region.  This measures 2.1 x 0.5 cm in maximum dimensions on image number 24.  This is within the subcutaneous fat.  Normal appearing cerebral hemispheres and posterior fossa structures.  Normal size and position of the ventricles.  No skull fracture, intracranial hemorrhage, mass lesion or CT evidence of acute infarction.  The included paranasal sinuses are normally pneumatized.  IMPRESSION:  1.  Findings suggesting a small scalp hematoma posteriorly on the right.  A soft tissue mass is less likely. 2.  Otherwise, normal examination.   Original Report Authenticated By: Beckie Salts, M.D.    Scheduled Meds: . cefTRIAXone (ROCEPHIN)  IV  2 g Intravenous Q12H  . insulin aspart  0-9 Units Subcutaneous TID WC  . pantoprazole  40 mg Oral Q1200  . sodium chloride  3 mL Intravenous Q12H  . vancomycin  1,000 mg Intravenous Q12H   Continuous Infusions: . sodium chloride 50 mL/hr at 08/18/12 0535    Principal Problem:   Meningitis due to bacteria Active Problems:   Anxiety and depression   HTN (hypertension)   Bipolar I disorder, most recent episode (or current) depressed, unspecified   History of diabetes mellitus   Weight loss    Seasonal allergies   History of MRSA infection   Cigarette smoker    Time spent: > 35 mins    [facility]  [facility] Pager [phone]. If 7PM-7AM, please contact night-coverage at [email or web address], password [facility] 08/18/2012, 5:56 PM  LOS: 2 days

## 2012-08-18 NOTE — ED Provider Notes (Signed)
Medical screening examination/treatment/procedure(s) were performed by non-physician practitioner and as supervising physician I was immediately available for consultation/collaboration.  Hurman Horn, MD 08/18/12 [phone]

## 2012-08-18 NOTE — Progress Notes (Signed)
[facility]    Date of Admission:  08/16/2012   Total days of antibiotics 3        Day 3 ceftriaxone        Day 3 vanco        D   ID: Virginia Simmons is a 37 y.o. female with GNR meningitis following sinusitis/cold/flu like symptoms x 1 week  Principal Problem:   Meningitis due to bacteria Active Problems:   Anxiety and depression   HTN (hypertension)   Bipolar I disorder, most recent episode (or current) depressed, unspecified   History of diabetes mellitus   Weight loss   Seasonal allergies   History of MRSA infection   Cigarette smoker    Subjective: Afebrile, denies headache or neck pain  Medications:  . cefTRIAXone (ROCEPHIN)  IV  2 g Intravenous Q12H  . insulin aspart  0-9 Units Subcutaneous TID WC  . pantoprazole  40 mg Oral Q1200  . sodium chloride  3 mL Intravenous Q12H  . vancomycin  1,000 mg Intravenous Q12H    Objective: Vital signs in last 24 hours: Temp:  [97.6 F (36.4 C)-97.9 F (36.6 C)] 97.9 F (36.6 C) (06/16 1500) Pulse Rate:  [55-70] 70 (06/16 1500) Resp:  [19-20] 19 (06/16 0649) BP: (108-125)/(81-86) 108/86 mmHg (06/16 1500) SpO2:  [100 %] 100 % (06/16 1500)  Physical Exam  Constitutional: oriented to person, place, and time.  appears well-developed and well-nourished. No distress. At her baseline HENT: no nuchal rigidity Mouth/Throat: Oropharynx is clear and moist. No oropharyngeal exudate.  Cardiovascular: Normal rate, regular rhythm and normal heart sounds. Exam reveals no gallop and no friction rub.  No murmur heard.  Pulmonary/Chest: Effort normal and breath sounds normal. No respiratory distress.has no wheezes.  Abdominal: Soft. Bowel sounds are normal.  exhibits no distension.   no tenderness.  Lymphadenopathy:  no cervical adenopathy.  Neurological:  alert and oriented to person, place, and time.  Skin: Skin is warm and dry. No rash noted. No erythema.  Psychiatric:has a normal mood and affect.   behavior is normal.    Lab Results  Recent Labs  08/17/12 0620 08/18/12 0545  WBC 6.1 9.5  HGB 12.7 11.4*  HCT 37.5 34.2*  NA 136 137  K 3.6 3.9  CL 102 107  CO2 23 24  BUN 14 17  CREATININE 0.93 0.80   Liver Panel  Recent Labs  08/17/12 0620  PROT 6.9  ALBUMIN 2.5*  AST 27  ALT 16  ALKPHOS 51  BILITOT 0.2*    Microbiology: 6/14 csf cx: GNR on gram stain, still no growth  Studies/Results: Dg Chest 2 View  08/16/2012   *RADIOLOGY REPORT*  Clinical Data: Headache, cough, fever  CHEST - 2 VIEW  Comparison: None.  Findings: Low lung volumes.  Mild cardiomegaly.  No focal infiltrate or overt edema.  No effusion.  Regional bones unremarkable.  IMPRESSION:  1.  Mild cardiomegaly.   Original Report Authenticated By: D. Andria Rhein, MD   Ct Head Wo Contrast  08/16/2012   *RADIOLOGY REPORT*  Clinical Data: Severe headache for the past week.  Nausea.  Blurred vision.  Photophobia.  No reported injury.  CT HEAD WITHOUT CONTRAST  Technique:  Contiguous axial images were obtained from the base of the skull through the vertex without contrast.  Comparison: None.  Findings: There is a crescentic area of increased soft tissue density along the outer margin of the skull posteriorly in the right  parietal region.  This measures 2.1 x 0.5 cm in maximum dimensions on image number 24.  This is within the subcutaneous fat.  Normal appearing cerebral hemispheres and posterior fossa structures.  Normal size and position of the ventricles.  No skull fracture, intracranial hemorrhage, mass lesion or CT evidence of acute infarction.  The included paranasal sinuses are normally pneumatized.  IMPRESSION:  1.  Findings suggesting a small scalp hematoma posteriorly on the right.  A soft tissue mass is less likely. 2.  Otherwise, normal examination.   Original Report Authenticated By: Beckie Salts, M.D.     Assessment/Plan: 37 yo female who presents with 1 wk of sinus/upper respiratory c/b meningitis, GNR  found on gram stain - continue with vancomycin and ceftriaxone for next 24-48hr to see what eventually is identified - can discontinue droplet precautions since this is not likely meningococcal meningitis - still would caution to have her 84 month old daughter from visiting at this time.  [facility] Cell: [phone] Pager: [phone]  08/18/2012, 4:43 PM

## 2012-08-18 NOTE — Care Management Note (Unsigned)
    Page 1 of 1   08/18/2012     4:14:02 PM   CARE MANAGEMENT NOTE 08/18/2012  Patient:  [facility]   Account Number:  [id number]  Date Initiated:  08/18/2012  Documentation initiated by:  Letha Cape  Subjective/Objective Assessment:   dx menigitis due to bacteria  admit- patient from home with baby.  pta indep.     Action/Plan:   Anticipated DC Date:  08/20/2012   Anticipated DC Plan:  HOME/SELF CARE      DC Planning Services  CM consult      Choice offered to / List presented to:             Status of service:  In process, will continue to follow Medicare Important Message given?   (If response is "NO", the following Medicare IM given date fields will be blank) Date Medicare IM given:   Date Additional Medicare IM given:    Discharge Disposition:    Per UR Regulation:  Reviewed for med. necessity/level of care/duration of stay  If discussed at Long Length of Stay Meetings, dates discussed:    Comments:  08/18/12 16:13 Letha Cape RN, BSN [phone] patient is from home with baby, pta indep.  NCM will continue to follow for dc needs.

## 2012-08-19 LAB — GLUCOSE, CAPILLARY: Glucose-Capillary: 107 mg/dL — ABNORMAL HIGH (ref 70–99)

## 2012-08-19 MED ORDER — AMOXICILLIN-POT CLAVULANATE 875-125 MG PO TABS: 1.0000 | ORAL_TABLET | Freq: Two times a day (BID) | ORAL | Status: AC

## 2012-08-19 NOTE — Progress Notes (Signed)
Patient discharge teaching given, including activity, diet, follow-up appoints, and medications. Patient verbalized understanding of all discharge instructions. IV access was d/c'd. Vitals are stable. Skin is intact except as charted in most recent assessments. Pt to be escorted out by RN, to be driven home by family.  

## 2012-08-19 NOTE — Progress Notes (Signed)
    [facility]    Date of Admission:  08/16/2012   Total days of antibiotics 4        Day 4 ceftriaxone        Day 4 vanco           ID: Virginia Simmons is a 37 y.o. female with GNR meningitis following sinusitis/cold/flu like symptoms x 1 week  Principal Problem:   Meningitis due to bacteria Active Problems:   Anxiety and depression   HTN (hypertension)   Bipolar I disorder, most recent episode (or current) depressed, unspecified   History of diabetes mellitus   Weight loss   Seasonal allergies   History of MRSA infection   Cigarette smoker    Subjective: Afebrile, denies headache or neck pain, looking forward to getting home to her 4 month old girl  Medications:  . cefTRIAXone (ROCEPHIN)  IV  2 g Intravenous Q12H  . fluticasone  1 spray Each Nare BID  . insulin aspart  0-9 Units Subcutaneous TID WC  . loratadine  10 mg Oral Daily  . pantoprazole  40 mg Oral Q1200  . sodium chloride  3 mL Intravenous Q12H  . vancomycin  1,000 mg Intravenous Q12H    Objective: Vital signs in last 24 hours: Temp:  [97.6 F (36.4 C)-98.1 F (36.7 C)] 97.6 F (36.4 C) (06/17 0506) Pulse Rate:  [63-70] 63 (06/17 0506) Resp:  [16-20] 16 (06/17 0506) BP: (108-127)/(71-86) 126/71 mmHg (06/17 0506) SpO2:  [100 %] 100 % (06/17 0506)  Physical Exam  Constitutional: oriented to person, place, and time.  appears well-developed and well-nourished. No distress. At her baseline HENT: no nuchal rigidity Mouth/Throat: Oropharynx is clear and moist. No oropharyngeal exudate.  Cardiovascular: Normal rate, regular rhythm and normal heart sounds. Exam reveals no gallop and no friction rub.  No murmur heard.  Pulmonary/Chest: Effort normal and breath sounds normal. No respiratory distress.has no wheezes.  Abdominal: Soft. Bowel sounds are normal.  exhibits no distension.   no tenderness.  Lymphadenopathy:  no cervical adenopathy.  Neurological:  alert and oriented to  person, place, and time.  Skin: Skin is warm and dry. No rash noted. No erythema.  Psychiatric:has a normal mood and affect.  behavior is normal.    Lab Results  Recent Labs  08/17/12 0620 08/18/12 0545  WBC 6.1 9.5  HGB 12.7 11.4*  HCT 37.5 34.2*  NA 136 137  K 3.6 3.9  CL 102 107  CO2 23 24  BUN 14 17  CREATININE 0.93 0.80   Liver Panel  Recent Labs  08/17/12 0620  PROT 6.9  ALBUMIN 2.5*  AST 27  ALT 16  ALKPHOS 51  BILITOT 0.2*    Microbiology: 6/14 csf cx: GNR on gram stain, still no growth  Studies/Results: No results found.   Assessment/Plan: 37 yo female who presents with 1 wk of sinus/upper respiratory c/b meningitis, GNR found on gram stain no growth on CSF cx at 60hrs. - discontinue IV antibiotics, since she has been afebrile for 48hrs + and switch to amox/clav 875mg  BID to treat for 7 days starting today to finish 10 day course of therapy to treat sinusitis (probable h.influenza) c/b reactive meningitis.  Will sign off.   [facility] Infect[facility] Cell: [phone] Pager: [phone]  08/19/2012, 1:17 PM

## 2012-08-19 NOTE — Discharge Summary (Signed)
Physician Discharge Summary  Virginia Simmons NWG:[id number] DOB: [birth year] DOA: 08/16/2012  PCP: Erlinda Hong, MD  Admit date: 08/16/2012 Discharge date: 08/19/2012  Time spent: 60 minutes  Recommendations for Outpatient Follow-up:  1. Followup with PCP one week post discharge. CSF cultures will need to be followed up upon on followup. Patient will be discharged on 7 days of Augmentin to complete a ten-day course of antibiotic therapy.  Discharge Diagnoses:  Principal Problem:   Meningitis due to bacteria Active Problems:   Anxiety and depression   HTN (hypertension)   Bipolar I disorder, most recent episode (or current) depressed, unspecified   History of diabetes mellitus   Weight loss   Seasonal allergies   History of MRSA infection   Cigarette smoker   Discharge Condition: Stable and improved  Diet recommendation: Carb modified diet  Filed Weights   08/16/12 2013  Weight: 117 kg (257 lb 15 oz)    History of present illness:  Virginia Simmons is a 37 y.o. female  has a past medical history of Diabetes mellitus; Hypertension; Fibromyalgia (lupus); and Depression.  Presented with  Patient reports one week history of not feeling well. She endorses headache, fevers, nausea and vomiting. Unfortunately patient is confused and unable to provide detailed history. She states she's not taking any medications. She has history of bipolar disorder. An LP was done in emergency department giving confusion headache and fever. The Gram stain showed gram-negative rods and 40 white blood cell. She was given 10 of Decadron started on ampicillin, Vancomycin, ceftriaxone and hospitalist was called on admission.  She denies any meningismus. She remains still altered but states she's feeling much better.  She denies any sick contacts.   Hospital Course:  1 bacterial meningitis  Patient had presented with global headaches that had gotten worse prior to admission some fever or chills drenching  sweats with worsening headaches. Patient also had presented with some confusion. Patient subsequently underwent a head CT in the emergency room which was negative for any acute abnormalities. Patient had a lumbar puncture done and there emergency room secondary to confusion and a fever with Gram stain showing gram-negative rods. Patient was given a dose of IV Decadron started on IV ampicillin or vancomycin and Rocephin. Patient was admitted to the step down unit initially and a ID consultation was obtained. Patient was seen in consultation by Dr. Orvan Falconer on 08/17/2012. Patient's CSF results did support a diagnosis of bacterial meningitis. The Gram stain came back with gram-negative rods however slide was reviewed by infectious disease that showed organisms ovarian morphology with a predominance of gram-positive cocci. Patient's dexamethasone was discontinued as well as the ampicillin. Patient was maintained on IV vancomycin and Rocephin while cultures were pending. Patient improved clinically. Blood cultures were obtained also with no growth to date. Patient was initially placed on droplet precautions which were discontinued after 48 hours. Patient remained afebrile no growth on CSF fluid cultures at 60 hours. IV antibiotics were discontinued and patient was switched to oral Augmentin 875 mg twice a day. Was recommended Plavix his disease that patient may be discharged on Augmentin 875 mg twice a day x7 more days to complete a ten-day course of antibiotic therapy to treat a possible sinusitis and bacterial/reactive meningitis. Patient will be discharged home in stable and improved condition. Patient is to followup with PCP one week post discharge. #2hypertension   Remained stable throughout the hospitalization.  #3 well-controlled diabetes mellitus  CBGs have ranged from 110-141.hemoglobin A1c 6.1.patient was maintained  on a sliding scale insulin during the hospitalization.  #4 bipolar disorder  Remained  stable.      Procedures:  CT head 08/16/2012  Chest x-ray 08/16/2012  Lumbar puncture 08/16/2012  Consultations:  Infectious disease : Dr. Cliffton Asters 08/17/2012  Discharge Exam: Filed Vitals:   08/18/12 1500 08/18/12 2141 08/19/12 0506 08/19/12 1356  BP: 108/86 127/71 126/71 124/83  Pulse: 70 65 63 62  Temp: 97.9 F (36.6 C) 98.1 F (36.7 C) 97.6 F (36.4 C) 98.1 F (36.7 C)  TempSrc: Oral Oral Oral Oral  Resp:  20 16 20   Height:      Weight:      SpO2: 100% 100% 100% 100%    General: NAD Cardiovascular: RRR Respiratory: CTAB  Discharge Instructions  Discharge Orders   Future Orders Complete By Expires     Diet Carb Modified  As directed     Discharge instructions  As directed     Comments:      Follow up with [facility], MD in 1 week.    Increase activity slowly  As directed         Medication List    TAKE these medications       ALPRAZolam 1 MG tablet  Commonly known as:  XANAX  Take 1 mg by mouth at bedtime as needed for sleep.     amoxicillin-clavulanate 875-125 MG per tablet  Commonly known as:  AUGMENTIN  Take 1 tablet by mouth 2 (two) times daily. Take for 7 days.     fexofenadine 180 MG tablet  Commonly known as:  ALLEGRA  Take 1 tablet (180 mg total) by mouth daily.     fluticasone 50 MCG/ACT nasal spray  Commonly known as:  FLONASE  Place 1 spray into the nose 2 (two) times daily.     ibuprofen 800 MG tablet  Commonly known as:  ADVIL,MOTRIN  Take 1 tablet (800 mg total) by mouth 3 (three) times daily.       No Known Allergies     Follow-up Information   Follow up with [facility], MD. Schedule an appointment as soon as possible for a visit in 1 week.   Contact information:   [address] [place] [address] [phone]        The results of significant diagnostics from this hospitalization (including imaging, microbiology, ancillary and laboratory) are listed below for reference.     Significant Diagnostic Studies: Dg Chest 2 View  08/16/2012   *RADIOLOGY REPORT*  Clinical Data: Headache, cough, fever  CHEST - 2 VIEW  Comparison: None.  Findings: Low lung volumes.  Mild cardiomegaly.  No focal infiltrate or overt edema.  No effusion.  Regional bones unremarkable.  IMPRESSION:  1.  Mild cardiomegaly.   Original Report Authenticated By: D. Andria Rhein, MD   Ct Head Wo Contrast  08/16/2012   *RADIOLOGY REPORT*  Clinical Data: Severe headache for the past week.  Nausea.  Blurred vision.  Photophobia.  No reported injury.  CT HEAD WITHOUT CONTRAST  Technique:  Contiguous axial images were obtained from the base of the skull through the vertex without contrast.  Comparison: None.  Findings: There is a crescentic area of increased soft tissue density along the outer margin of the skull posteriorly in the right parietal region.  This measures 2.1 x 0.5 cm in maximum dimensions on image number 24.  This is within the subcutaneous fat.  Normal appearing cerebral hemispheres and posterior fossa structures.  Normal size  and position of the ventricles.  No skull fracture, intracranial hemorrhage, mass lesion or CT evidence of acute infarction.  The included paranasal sinuses are normally pneumatized.  IMPRESSION:  1.  Findings suggesting a small scalp hematoma posteriorly on the right.  A soft tissue mass is less likely. 2.  Otherwise, normal examination.   Original Report Authenticated By: Beckie Salts, M.D.    Microbiology: Recent Results (from the past 240 hour(s))  CULTURE, BLOOD (ROUTINE X 2)     Status: None   Collection Time    08/16/12  8:05 PM      Result Value Range Status   Specimen Description BLOOD LEFT ARM   Final   Special Requests BOTTLES DRAWN AEROBIC ONLY 10CC   Final   Culture  Setup Time 08/17/2012 02:21   Final   Culture     Final   Value:        BLOOD CULTURE RECEIVED NO GROWTH TO DATE CULTURE WILL BE HELD FOR 5 DAYS BEFORE ISSUING A FINAL NEGATIVE REPORT   Report  Status PENDING   Incomplete  CULTURE, BLOOD (ROUTINE X 2)     Status: None   Collection Time    08/16/12  8:15 PM      Result Value Range Status   Specimen Description BLOOD RIGHT HAND   Final   Special Requests     Final   Value: BOTTLES DRAWN AEROBIC AND ANAEROBIC 10CC BLUE 6CC RED   Culture  Setup Time 08/17/2012 02:21   Final   Culture     Final   Value:        BLOOD CULTURE RECEIVED NO GROWTH TO DATE CULTURE WILL BE HELD FOR 5 DAYS BEFORE ISSUING A FINAL NEGATIVE REPORT   Report Status PENDING   Incomplete  CSF CULTURE     Status: None   Collection Time    08/16/12  8:47 PM      Result Value Range Status   Specimen Description CSF   Final   Special Requests NONE   Final   Gram Stain     Final   Value: CYTOSPIN ABUNDANT WBC PRESENT,BOTH PMN AND MONONUCLEAR     GRAM NEGATIVE RODS     Performed at [facility] CRITICAL RESULT CALLED TO, READ BACK BY AND VERIFIED WITH: CRITICAL RESULT CALLED TO, READ BACK BY AND VERIFIED WITHLeotis Shames RICE RN [id number] GREEN R   Culture NO GROWTH 2 DAYS   Final   Report Status PENDING   Incomplete  GRAM STAIN     Status: None   Collection Time    08/16/12  8:47 PM      Result Value Range Status   Specimen Description CSF   Final   Special Requests Normal   Final   Gram Stain     Final   Value: CYTOSPUN SAMPLE     WBC PRESENT,BOTH PMN AND MONONUCLEAR     GRAM NEGATIVE RODS     CRITICAL RESULT CALLED TO, READ BACK BY AND VERIFIED WITHShepard General RN [id number] GREEN R   Report Status 08/16/2012 FINAL   Final  MRSA PCR SCREENING     Status: None   Collection Time    08/17/12  4:00 AM      Result Value Range Status   MRSA by PCR NEGATIVE  NEGATIVE Final   Comment:            The GeneXpert MRSA Assay (FDA     approved  for NASAL specimens     only), is one component of a     comprehensive MRSA colonization     surveillance program. It is not     intended to diagnose MRSA     infection nor to guide or     monitor treatment for      MRSA infections.  MRSA PCR SCREENING     Status: None   Collection Time    08/17/12  7:33 AM      Result Value Range Status   MRSA by PCR NEGATIVE  NEGATIVE Final   Comment:            The GeneXpert MRSA Assay (FDA     approved for NASAL specimens     only), is one component of a     comprehensive MRSA colonization     surveillance program. It is not     intended to diagnose MRSA     infection nor to guide or     monitor treatment for     MRSA infections.     Labs: Basic Metabolic Panel:  Recent Labs Lab 08/16/12 1946 08/17/12 0620 08/18/12 0545  NA 132* 136 137  K 3.6 3.6 3.9  CL 97 102 107  CO2 24 23 24   GLUCOSE 100* 222* 141*  BUN 12 14 17   CREATININE 0.99 0.93 0.80  CALCIUM 9.0 7.9* 8.2*  MG  --  2.0  --   PHOS  --  3.6  --    Liver Function Tests:  Recent Labs Lab 08/17/12 0620  AST 27  ALT 16  ALKPHOS 51  BILITOT 0.2*  PROT 6.9  ALBUMIN 2.5*   No results found for this basename: LIPASE, AMYLASE,  in the last 168 hours No results found for this basename: AMMONIA,  in the last 168 hours CBC:  Recent Labs Lab 08/16/12 1946 08/17/12 0620 08/18/12 0545  WBC 7.8 6.1 9.5  NEUTROABS 6.2  --   --   HGB 13.6 12.7 11.4*  HCT 39.7 37.5 34.2*  MCV 77.8* 79.1 79.7  PLT 216 203 229   Cardiac Enzymes: No results found for this basename: CKTOTAL, CKMB, CKMBINDEX, TROPONINI,  in the last 168 hours BNP: BNP (last 3 results) No results found for this basename: PROBNP,  in the last 8760 hours CBG:  Recent Labs Lab 08/18/12 1252 08/18/12 1757 08/18/12 2146 08/19/12 0752 08/19/12 1135  GLUCAP 110* 116* 138* 119* 107*       Signed:  THOMPSON,DANIEL  [facility] 08/19/2012, 2:09 PM

## 2012-08-19 NOTE — Progress Notes (Signed)
Utilization Review Complete  

## 2012-08-20 LAB — CSF CULTURE W GRAM STAIN

## 2012-08-23 LAB — CULTURE, BLOOD (ROUTINE X 2): Culture: NO GROWTH

## 2012-12-01 ENCOUNTER — Emergency Department (HOSPITAL_COMMUNITY)
Admission: EM | Admit: 2012-12-01 | Discharge: 2012-12-01 | Disposition: A | Discharge status: Home / Self Care | Payer: 59 | Source: Home / Self Care | Attending: Emergency Medicine | Admitting: Emergency Medicine

## 2012-12-01 ENCOUNTER — Encounter (HOSPITAL_COMMUNITY): Payer: Self-pay | Admitting: Emergency Medicine

## 2012-12-01 DIAGNOSIS — J329 Chronic sinusitis, unspecified: Secondary | ICD-10-CM

## 2012-12-01 HISTORY — DX: Meningitis, unspecified: G03.9

## 2012-12-01 MED ORDER — SALINE SPRAY 0.65 % NA SOLN: 1.0000 | NASAL | Status: AC | PRN

## 2012-12-01 MED ORDER — AMOXICILLIN-POT CLAVULANATE 875-125 MG PO TABS: 1.0000 | ORAL_TABLET | Freq: Two times a day (BID) | ORAL | Status: DC

## 2012-12-01 NOTE — ED Notes (Signed)
Sore throat, right ear pain, cough and congestion, reports green phlegm.

## 2012-12-01 NOTE — ED Provider Notes (Signed)
Medical screening examination/treatment/procedure(s) were performed by non-physician practitioner and as supervising physician I was immediately available for consultation/collaboration.  Leslee Home, M.D.   Reuben Likes, MD 12/01/12 [phone]

## 2012-12-01 NOTE — ED Provider Notes (Signed)
CSN: [id number]     Arrival date & time 12/01/12  4098 History   First MD Initiated Contact with Patient 12/01/12 [phone]     Chief Complaint  Patient presents with  . URI   (Consider location/radiation/quality/duration/timing/severity/associated sxs/prior Treatment) HPI Comments: Pt with cold sx that are worsening. This morning had chills and felt had a fever, took tylenol at 6:30am.  Reports green discharge from nose and when coughs.   Patient is a 37 y.o. female presenting with URI. The history is provided by the patient.  URI Presenting symptoms: congestion, cough, fever and sore throat   Presenting symptoms: no ear pain and no rhinorrhea   Severity:  Severe Onset quality:  Gradual Duration:  4 days Progression:  Worsening Chronicity:  New Relieved by:  Nothing Worsened by:  Nothing tried Ineffective treatments:  None tried Associated symptoms: sinus pain   Associated symptoms: no wheezing   Risk factors: diabetes mellitus   Risk factors comment:  Pt takes prednisone for rheumatoid arthritis   Past Medical History  Diagnosis Date  . Diabetes mellitus   . Hypertension   . Fibromyalgia lupus  . Depression    Past Surgical History  Procedure Laterality Date  . Hand surgery    . Foot fracture surgery    . Cesarean section  08/30/2011    Procedure: CESAREAN SECTION;  Surgeon: Bing Plume, MD;  Location: [facility];  Service: Gynecology;  Laterality: N/A;  . Foot surgery      left   . Hand surgery      left  . Cesarean section    . Laparoscopic tubal ligation N/A 05/28/2012    Procedure: LAPAROSCOPIC TUBAL LIGATION;  Surgeon: Bing Plume, MD;  Location: [facility];  Service: Gynecology;  Laterality: N/A;  laparoscopic tubal cauterization   Family History  Problem Relation Age of Onset  . Hypertension Father   . Diabetes Father   . Drug abuse Father   . Bipolar disorder Sister    History  Substance Use Topics  . Smoking status: Current Every Day Smoker -- 0.25  packs/day for 10 years    Types: Cigarettes  . Smokeless tobacco: Never Used  . Alcohol Use: No   OB History   Grav Para Term Preterm Abortions TAB SAB Ect Mult Living   2 1 1  0 0 0 0 0 0 1     Review of Systems  Constitutional: Positive for fever and chills.  HENT: Positive for congestion, sore throat, postnasal drip and sinus pressure. Negative for ear pain, rhinorrhea and neck stiffness.   Respiratory: Positive for cough. Negative for wheezing.     Allergies  Review of patient's allergies indicates no known allergies.  Home Medications   Current Outpatient Rx  Name  Route  Sig  Dispense  Refill  . Calcium-Magnesium-Vitamin D (CALCIUM 500 PO)   Oral   Take by mouth.         . FOLIC ACID PO   Oral   Take by mouth.         Marland Kitchen PREDNISONE PO   Oral   Take by mouth.         . ALPRAZolam (XANAX) 1 MG tablet   Oral   Take 1 mg by mouth at bedtime as needed for sleep.         Marland Kitchen amoxicillin-clavulanate (AUGMENTIN) 875-125 MG per tablet   Oral   Take 1 tablet by mouth 2 (two) times daily.   20 tablet  0   . EXPIRED: fexofenadine (ALLEGRA) 180 MG tablet   Oral   Take 1 tablet (180 mg total) by mouth daily.   30 tablet   1   . EXPIRED: fluticasone (FLONASE) 50 MCG/ACT nasal spray   Nasal   Place 1 spray into the nose 2 (two) times daily.   1 g   2   . ibuprofen (ADVIL,MOTRIN) 800 MG tablet   Oral   Take 1 tablet (800 mg total) by mouth 3 (three) times daily.   30 tablet   0   . sodium chloride (OCEAN) 0.65 % SOLN nasal spray   Nasal   Place 1 spray into the nose as needed for congestion.   1 Bottle   0    BP 137/97  Pulse 90  Temp(Src) 98.7 F (37.1 C) (Oral)  Resp 18  SpO2 98%  LMP 11/01/2012 Physical Exam  Constitutional: She appears well-developed and well-nourished. She appears ill. No distress.  HENT:  Right Ear: External ear normal.  Left Ear: Tympanic membrane, external ear and ear canal normal.  Nose: Mucosal edema present. Right  sinus exhibits maxillary sinus tenderness. Right sinus exhibits no frontal sinus tenderness. Left sinus exhibits maxillary sinus tenderness. Left sinus exhibits no frontal sinus tenderness.  Mouth/Throat: Oropharynx is clear and moist and mucous membranes are normal.  R ear canal with cerumen impaction, unable to see TM  Cardiovascular: Normal rate and regular rhythm.   Pulmonary/Chest: Effort normal and breath sounds normal.  Lymphadenopathy:       Head (right side): No submental, no submandibular and no tonsillar adenopathy present.       Head (left side): No submental, no submandibular and no tonsillar adenopathy present.    She has no cervical adenopathy.    ED Course  Procedures (including critical care time) Labs Review Labs Reviewed - No data to display Imaging Review No results found.  MDM   1. Sinusitis   concern for bacterial sinusitis given worsening sx, fever this morning; pt takes prednisone and has diabetes. Rx augmentin 875/125 BID #20 and saline nasal spray. Pt to f/u with pcp this week for recheck and to go to ER if worsens.      Cathlyn Parsons, NP 12/01/12 [phone]

## 2012-12-01 NOTE — ED Notes (Signed)
Triage process interrupted by department concern.

## 2013-01-24 ENCOUNTER — Emergency Department (HOSPITAL_COMMUNITY)
Admission: EM | Admit: 2013-01-24 | Discharge: 2013-01-24 | Disposition: A | Discharge status: Home / Self Care | Payer: Self-pay | Source: Home / Self Care | Attending: Family Medicine | Admitting: Family Medicine

## 2013-01-24 ENCOUNTER — Emergency Department (HOSPITAL_COMMUNITY): Payer: 59

## 2013-01-24 ENCOUNTER — Encounter (HOSPITAL_COMMUNITY): Payer: Self-pay | Admitting: Emergency Medicine

## 2013-01-24 DIAGNOSIS — S8002XA Contusion of left knee, initial encounter: Secondary | ICD-10-CM

## 2013-01-24 DIAGNOSIS — S8000XA Contusion of unspecified knee, initial encounter: Secondary | ICD-10-CM
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
## 2013-01-26 NOTE — ED Provider Notes (Signed)
Medical screening examination/treatment/procedure(s) were performed by a resident physician or non-physician practitioner and as the supervising physician I was immediately available for consultation/collaboration.  Evan Corey, MD    Evan S Corey, MD 01/26/13 0756 

## 2013-03-21 IMAGING — US US TRANSVAGINAL NON-OB
1 series · 13 of 25 positions shown · non-contrast
Comparison: None

CLINICAL DATA: Heavy bleeding post therapeutic abortion on
01/25/2012 of 14 weeks.



[Series 1: us pelvis complete · 13 of 42 slices shown]
[im 1/42]
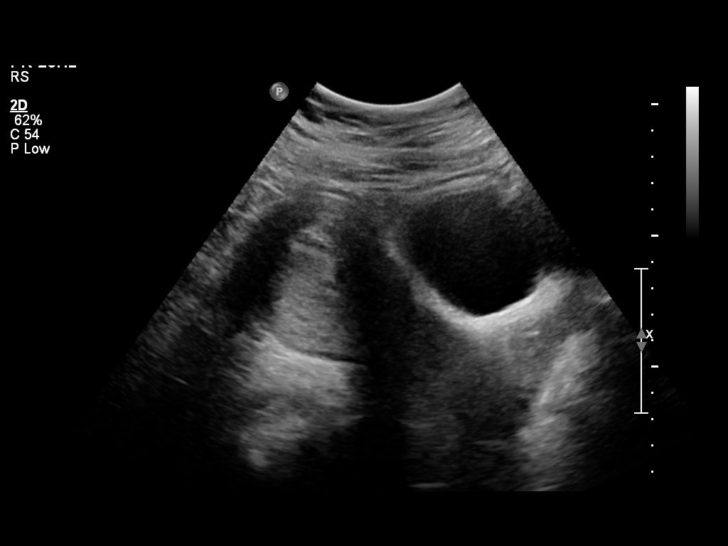
[im 4/42]
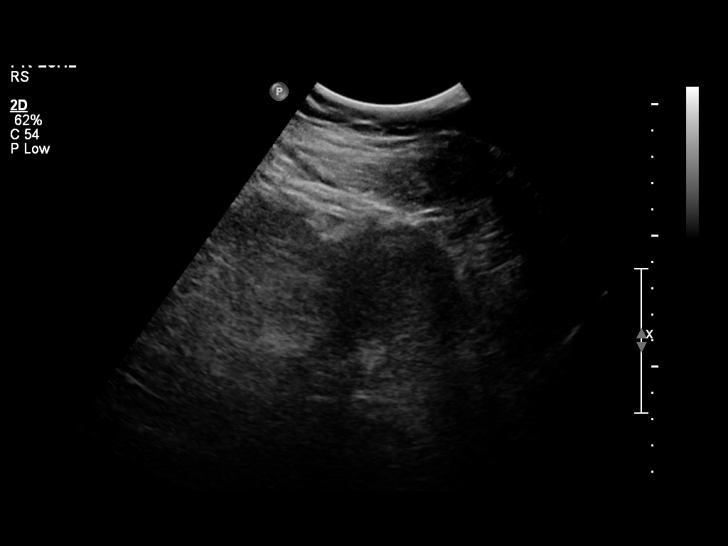
[im 7/42]
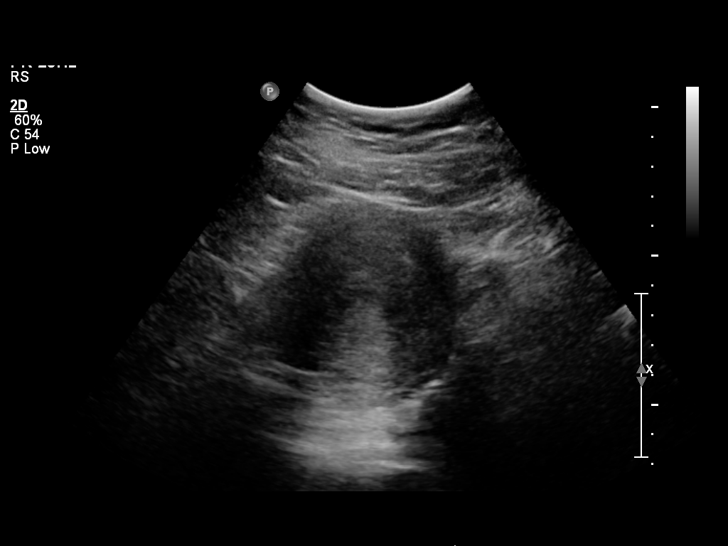
[im 11/42]
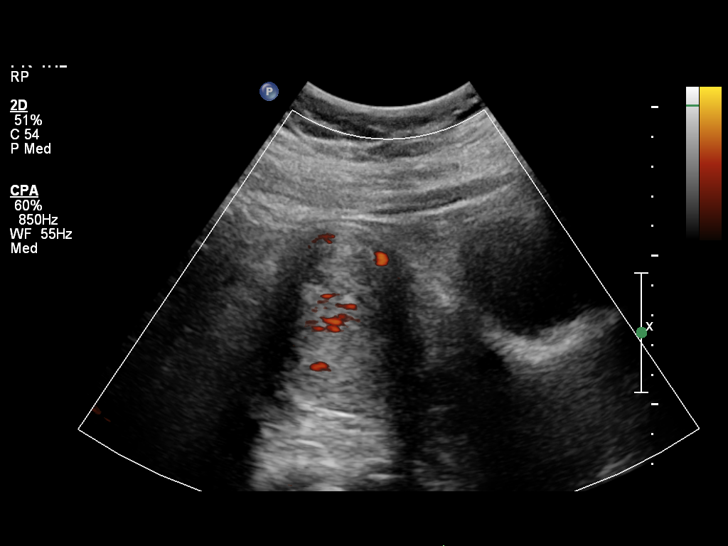
[im 14/42]
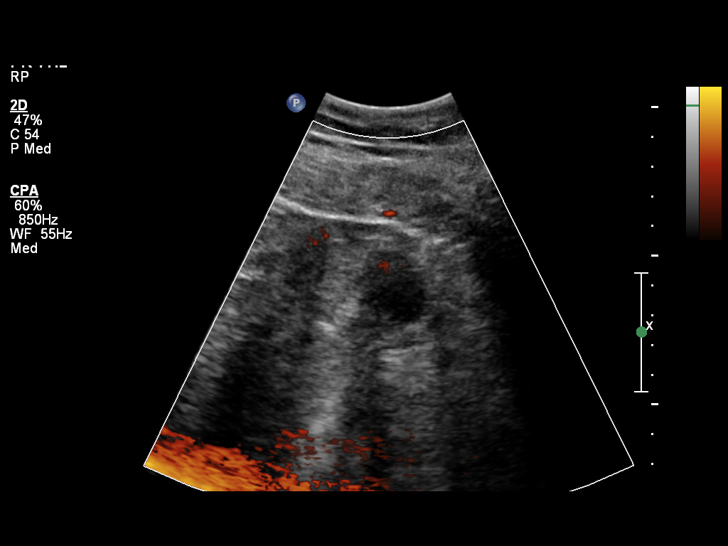
[im 18/42]
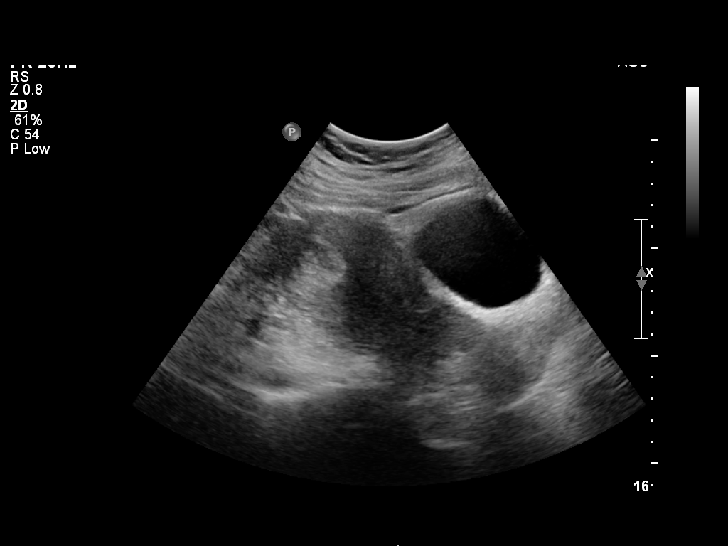
[im 21/42]
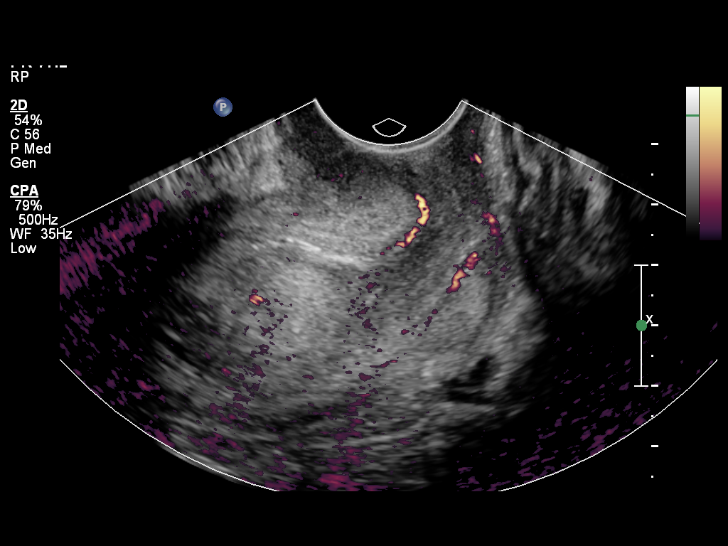
[im 24/42]
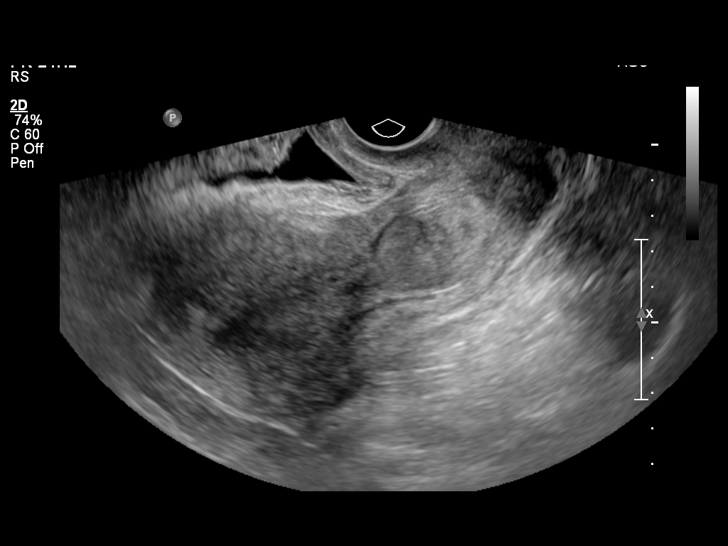
[im 28/42]
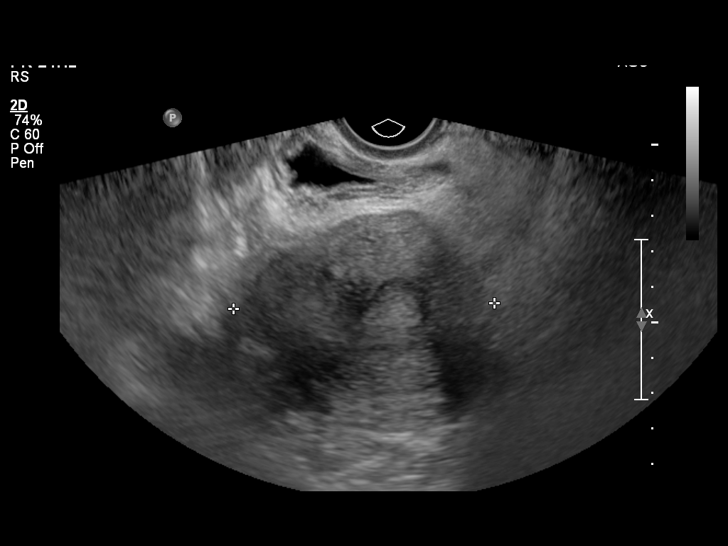
[im 31/42]
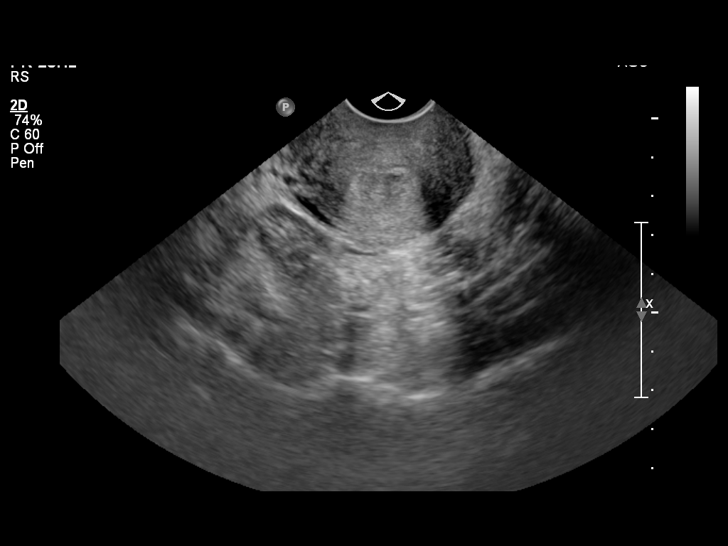
[im 35/42]
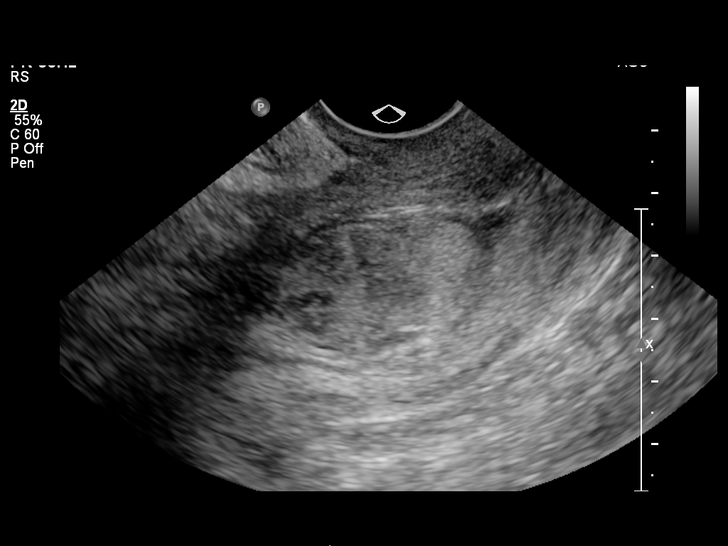
[im 38/42]
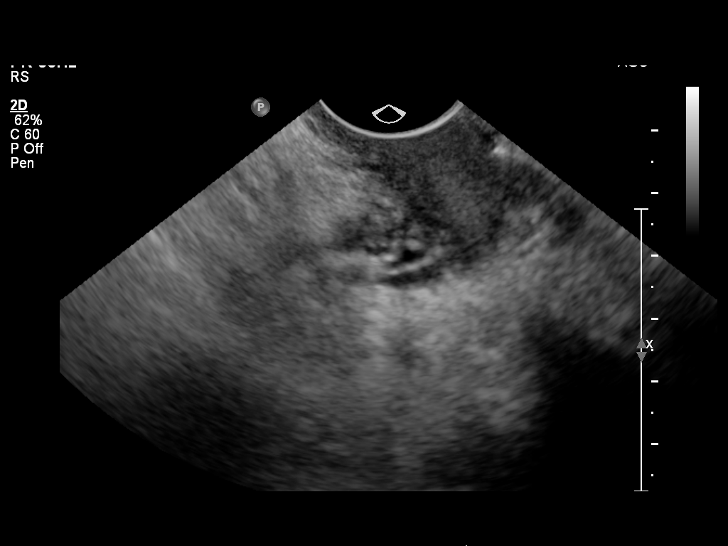
[im 42/42]
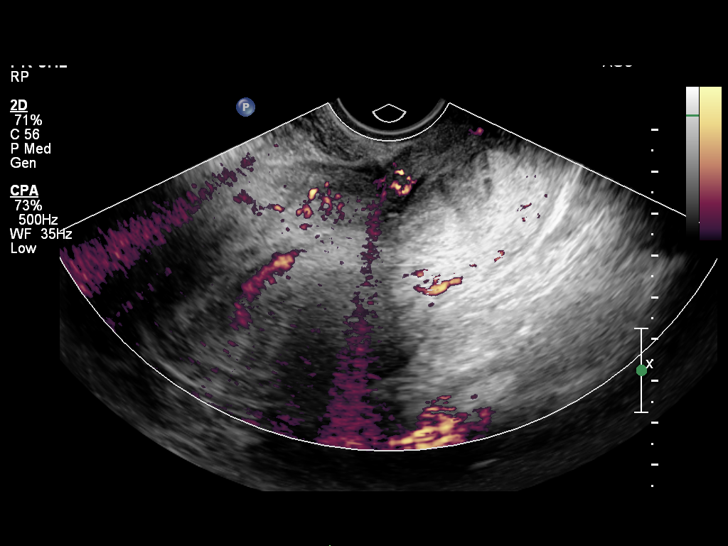

[13 of 25 positions shown; findings below may reference images not displayed]

FINDINGS: Uterus: The uterus is anteverted and measures 12.2 x 5.6 x 7.4 cm.
No myometrial mass lesions are identified.

Endometrium: Heterogeneous and diffusely expanded endometrium with
mixed echogenic material and fluid. Color flow Doppler images
demonstrate flow within the endometrium.  Changes suggest retained
products of conception.

Right ovary:  Right ovary measures 3.7 x 2.2 x 3 cm.  No abnormal
adnexal masses.  Right ovary is not visualized transvaginally.

Left ovary: Left ovary measures 5.5 x 2.9 x 2.7 cm.  No abnormal
adnexal masses.  Left ovary was not visualized transvaginally.

Other findings: No free pelvic fluid collections.
IMPRESSION: Prominent expansion of the endometrium with heterogeneous mixed
echogenic material.  Flow is demonstrated within the endometrial
canal.  Changes consistent with retained products of conception..

## 2013-07-05 ENCOUNTER — Emergency Department (INDEPENDENT_AMBULATORY_CARE_PROVIDER_SITE_OTHER)
Admission: EM | Admit: 2013-07-05 | Discharge: 2013-07-05 | Disposition: A | Discharge status: Home / Self Care | Payer: Medicaid Other | Source: Home / Self Care | Attending: Family Medicine | Admitting: Family Medicine

## 2013-07-05 ENCOUNTER — Encounter (HOSPITAL_COMMUNITY): Payer: Self-pay | Admitting: Emergency Medicine

## 2013-07-05 DIAGNOSIS — B029 Zoster without complications: Secondary | ICD-10-CM

## 2013-07-05 MED ORDER — TRAMADOL HCL 50 MG PO TABS
50.0000 mg | ORAL_TABLET | Freq: Four times a day (QID) | ORAL | Status: AC | PRN
Start: 2013-07-05 — End: ?

## 2013-07-05 MED ORDER — VALACYCLOVIR HCL 1 G PO TABS: 1000.0000 mg | ORAL_TABLET | Freq: Three times a day (TID) | ORAL | Status: AC

## 2013-07-05 NOTE — ED Provider Notes (Signed)
Virginia Simmons is a 38 y.o. female who presents to Urgent Care today for right posterior leg wound. Patient has pain and tingling extending down the posterior right leg. Yesterday she felt groups of small blisters that have ruptured. She notes tingling pain. She denies any fevers or chills nausea vomiting or diarrhea. She denies a history of shingles however she did have chickenpox as a child.   Past Medical History  Diagnosis Date  . Diabetes mellitus   . Hypertension   . Fibromyalgia lupus  . Depression   . Meningitis    History  Substance Use Topics  . Smoking status: Current Every Day Smoker -- 0.25 packs/day for 10 years    Types: Cigarettes  . Smokeless tobacco: Never Used  . Alcohol Use: No   ROS as above Medications: No current facility-administered medications for this encounter.   Current Outpatient Prescriptions  Medication Sig Dispense Refill  . OVER THE COUNTER MEDICATION Blood pressure medicine      . ALPRAZolam (XANAX) 1 MG tablet Take 1 mg by mouth at bedtime as needed for sleep.      Marland Kitchen. amoxicillin-clavulanate (AUGMENTIN) 875-125 MG per tablet Take 1 tablet by mouth 2 (two) times daily.  20 tablet  0  . Calcium-Magnesium-Vitamin D (CALCIUM 500 PO) Take by mouth.      . fexofenadine (ALLEGRA) 180 MG tablet Take 1 tablet (180 mg total) by mouth daily.  30 tablet  1  . fluticasone (FLONASE) 50 MCG/ACT nasal spray Place 1 spray into the nose 2 (two) times daily.  1 g  2  . FOLIC ACID PO Take by mouth.      Marland Kitchen. ibuprofen (ADVIL,MOTRIN) 800 MG tablet Take 1 tablet (800 mg total) by mouth 3 (three) times daily.  30 tablet  0  . PREDNISONE PO Take by mouth.      . sodium chloride (OCEAN) 0.65 % SOLN nasal spray Place 1 spray into the nose as needed for congestion.  1 Bottle  0  . traMADol (ULTRAM) 50 MG tablet Take 1 tablet (50 mg total) by mouth every 6 (six) hours as needed.  15 tablet  0  . valACYclovir (VALTREX) 1000 MG tablet Take 1 tablet (1,000 mg total) by mouth 3  (three) times daily.  30 tablet  0    Exam:  BP 115/73  Pulse 71  Temp(Src) 98.3 F (36.8 C) (Oral)  Resp 22  SpO2 100%  LMP 06/19/2013 Gen: Well NAD Skin: Small group of crusted ruptured vesicles with erythematous base on the posterior leg  No results found for this or any previous visit (from the past 24 hour(s)). No results found.  Assessment and Plan: 38 y.o. female with shingles. Plan to treat with Valtrex and tramadol.  Discussed warning signs or symptoms. Please see discharge instructions. Patient expresses understanding.    Rodolph BongEvan S Merinda Victorino, MD 07/05/13 1311

## 2013-07-05 NOTE — Discharge Instructions (Signed)
Shingles Shingles (herpes zoster) is an infection that is caused by the same virus that causes chickenpox (varicella). The infection causes a painful skin rash and fluid-filled blisters, which eventually break open, crust over, and heal. It may occur in any area of the body, but it usually affects only one side of the body or face. The pain of shingles usually lasts about 1 month. However, some people with shingles may develop long-term (chronic) pain in the affected area of the body. Shingles often occurs many years after the person had chickenpox. It is more common:  In people older than 50 years.  In people with weakened immune systems, such as those with HIV, AIDS, or cancer.  In people taking medicines that weaken the immune system, such as transplant medicines.  In people under great stress. CAUSES  Shingles is caused by the varicella zoster virus (VZV), which also causes chickenpox. After a person is infected with the virus, it can remain in the person's body for years in an inactive state (dormant). To cause shingles, the virus reactivates and breaks out as an infection in a nerve root. The virus can be spread from person to person (contagious) through contact with open blisters of the shingles rash. It will only spread to people who have not had chickenpox. When these people are exposed to the virus, they may develop chickenpox. They will not develop shingles. Once the blisters scab over, the person is no longer contagious and cannot spread the virus to others. SYMPTOMS  Shingles shows up in stages. The initial symptoms may be pain, itching, and tingling in an area of the skin. This pain is usually described as burning, stabbing, or throbbing.In a few days or weeks, a painful red rash will appear in the area where the pain, itching, and tingling were felt. The rash is usually on one side of the body in a band or belt-like pattern. Then, the rash usually turns into fluid-filled blisters. They  will scab over and dry up in approximately 2 3 weeks. Flu-like symptoms may also occur with the initial symptoms, the rash, or the blisters. These may include:  Fever.  Chills.  Headache.  Upset stomach. DIAGNOSIS  Your caregiver will perform a skin exam to diagnose shingles. Skin scrapings or fluid samples may also be taken from the blisters. This sample will be examined under a microscope or sent to a lab for further testing. TREATMENT  There is no specific cure for shingles. Your caregiver will likely prescribe medicines to help you manage the pain, recover faster, and avoid long-term problems. This may include antiviral drugs, anti-inflammatory drugs, and pain medicines. HOME CARE INSTRUCTIONS   Take a cool bath or apply cool compresses to the area of the rash or blisters as directed. This may help with the pain and itching.   Only take over-the-counter or prescription medicines as directed by your caregiver.   Rest as directed by your caregiver.  Keep your rash and blisters clean with mild soap and cool water or as directed by your caregiver.  Do not pick your blisters or scratch your rash. Apply an anti-itch cream or numbing creams to the affected area as directed by your caregiver.  Keep your shingles rash covered with a loose bandage (dressing).  Avoid skin contact with:  Babies.   Pregnant women.   Children with eczema.   Elderly people with transplants.   People with chronic illnesses, such as leukemia or AIDS.   Wear loose-fitting clothing to help ease   the pain of material rubbing against the rash.  Keep all follow-up appointments with your caregiver.If the area involved is on your face, you may receive a referral for follow-up to a specialist, such as an eye doctor (ophthalmologist) or an ear, nose, and throat (ENT) doctor. Keeping all follow-up appointments will help you avoid eye complications, chronic pain, or disability.  SEEK IMMEDIATE MEDICAL  CARE IF:   You have facial pain, pain around the eye area, or loss of feeling on one side of your face.  You have ear pain or ringing in your ear.  You have loss of taste.  Your pain is not relieved with prescribed medicines.   Your redness or swelling spreads.   You have more pain and swelling.  Your condition is worsening or has changed.   You have a feveror persistent symptoms for more than 2 3 days.  You have a fever and your symptoms suddenly get worse. MAKE SURE YOU:  Understand these instructions.  Will watch your condition.  Will get help right away if you are not doing well or get worse. Document Released: 02/19/2005 Document Revised: 11/14/2011 Document Reviewed: 10/04/2011 [employer] Patient Information 2014 [employer].  

## 2013-07-05 NOTE — ED Notes (Signed)
Back of right thigh with area of redness, open skin.  Reports having pain in back of thigh and tingling sensation before anything appeared, then had little bumps appear.  Area is painful

## 2013-09-21 IMAGING — CT CT HEAD W/O CM
1 of 2 series · 13 of 30 positions shown, 17 images · non-contrast
Comparison: None.

CLINICAL DATA: Severe headache for the past week.  Nausea.  Blurred
vision.  Photophobia.  No reported injury.

CT HEAD WITHOUT CONTRAST
TECHNIQUE: Contiguous axial images were obtained from the base of
the skull through the vertex without contrast.

[Series 2: brain · axial · 0.47mm/px · z∈[+119,+263]mm · 13 of 32 slices shown, 17 images]
[im 3/32  brain]
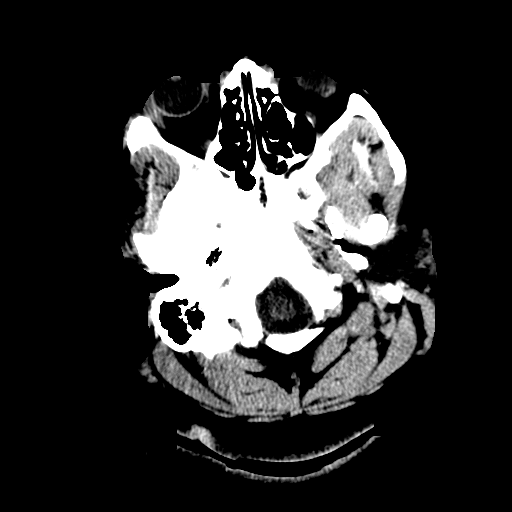
[im 3/32  bone]
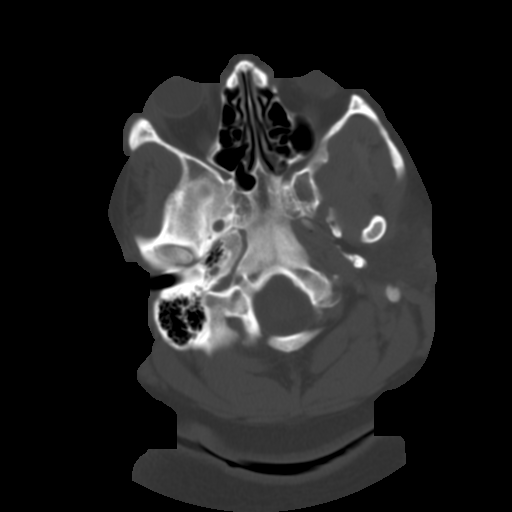
[im 5/32  brain]
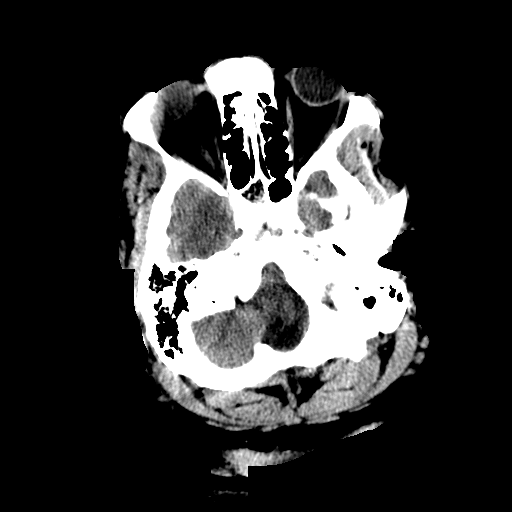
[im 7/32  brain]
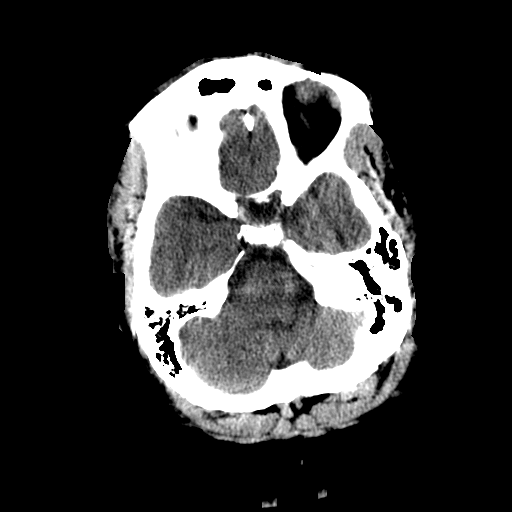
[im 9/32  brain]
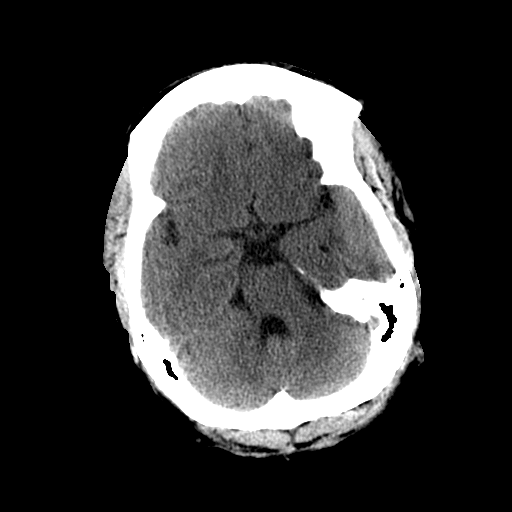
[im 12/32  brain]
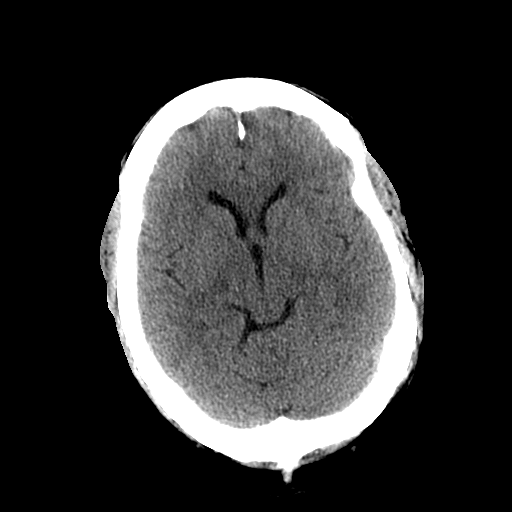
[im 12/32  bone]
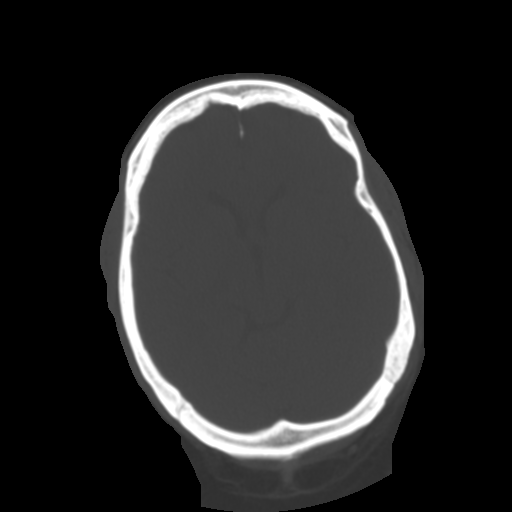
[im 14/32  brain]
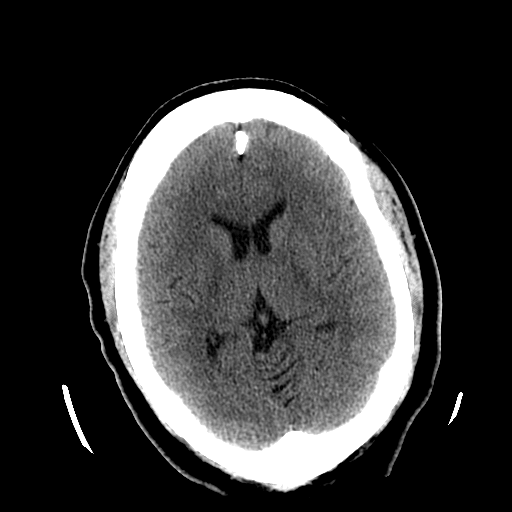
[im 16/32  brain]
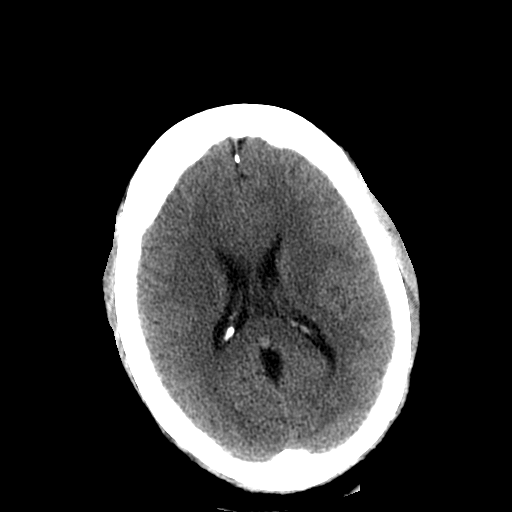
[im 18/32  brain]
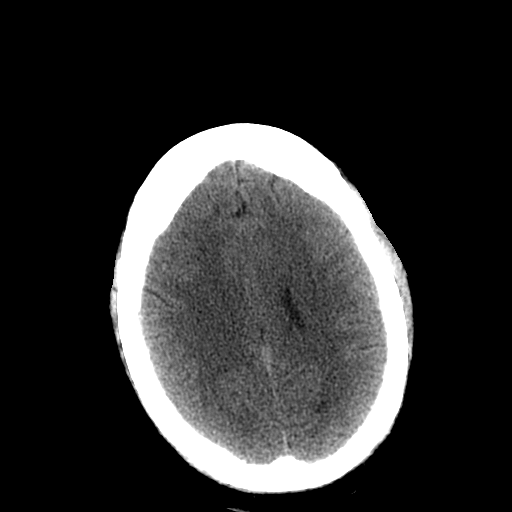
[im 20/32  brain]
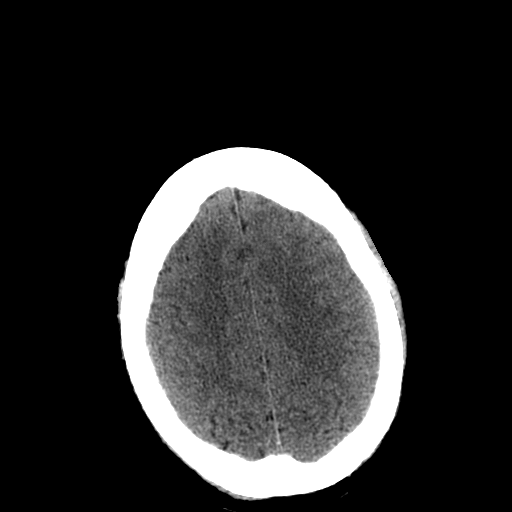
[im 20/32  bone]
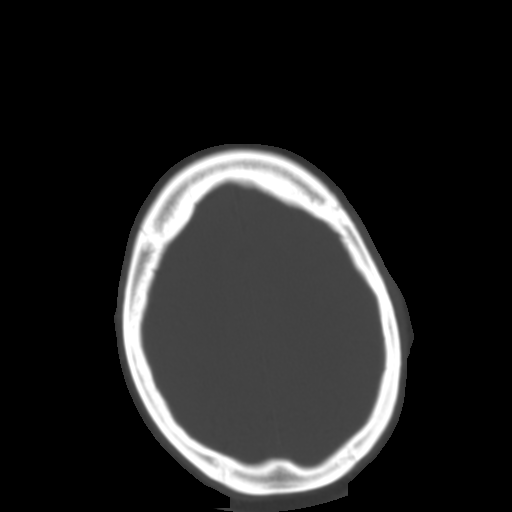
[im 23/32  brain]
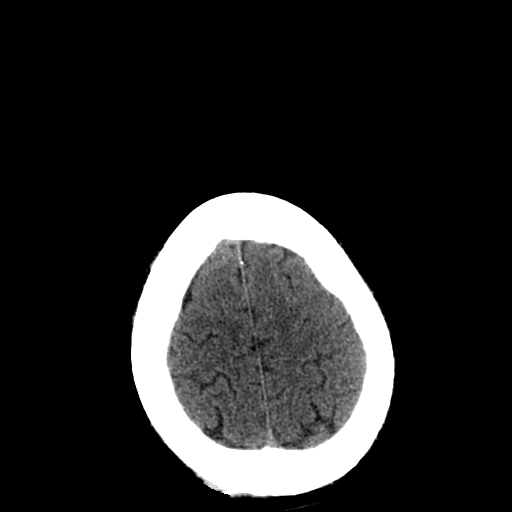
[im 25/32  brain]
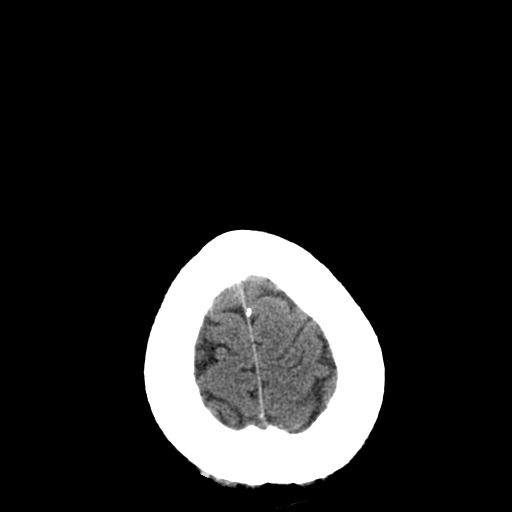
[im 27/32  brain]
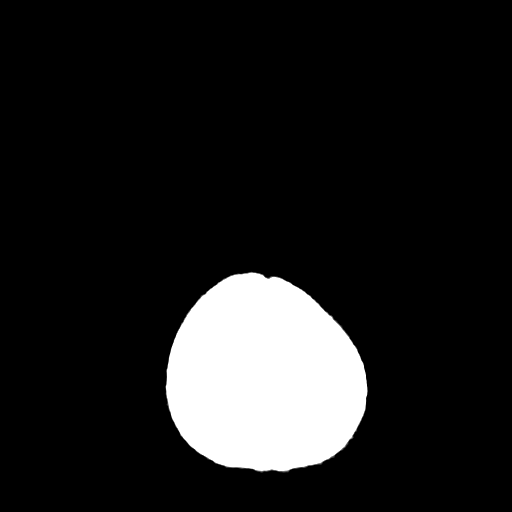
[im 29/32  brain]
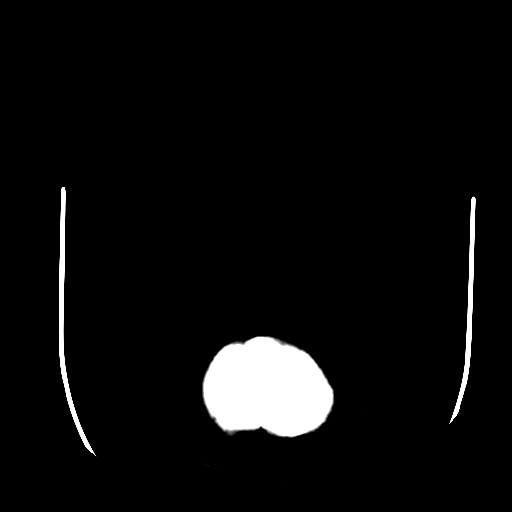
[im 29/32  bone]
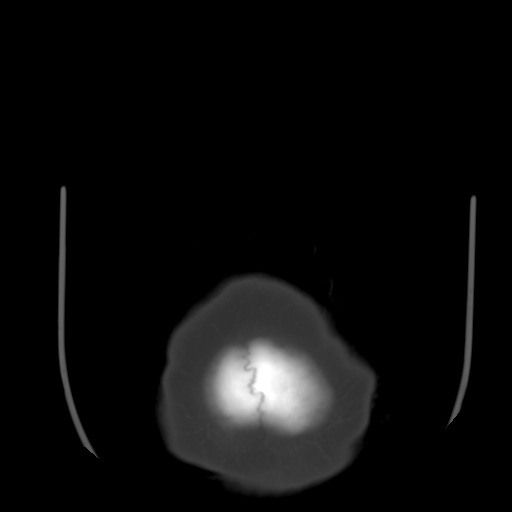

[13 of 30 positions shown; findings below may reference images not displayed]

FINDINGS: There is a crescentic area of increased soft tissue
density along the outer margin of the skull posteriorly in the
right parietal region.  This measures 2.1 x 0.5 cm in maximum
dimensions on image number 24.  This is within the subcutaneous
fat.

Normal appearing cerebral hemispheres and posterior fossa
structures.  Normal size and position of the ventricles.  No skull
fracture, intracranial hemorrhage, mass lesion or CT evidence of
acute infarction.  The included paranasal sinuses are normally
pneumatized.
IMPRESSION: 1.  Findings suggesting a small scalp hematoma posteriorly on the
right.  A soft tissue mass is less likely.
2.  Otherwise, normal examination.

## 2014-01-04 ENCOUNTER — Encounter (HOSPITAL_COMMUNITY): Payer: Self-pay | Admitting: Emergency Medicine

## 2014-05-05 ENCOUNTER — Emergency Department (HOSPITAL_COMMUNITY)
Admission: EM | Admit: 2014-05-05 | Discharge: 2014-05-05 | Disposition: A | Discharge status: Home / Self Care | Payer: BLUE CROSS/BLUE SHIELD | Attending: Emergency Medicine | Admitting: Emergency Medicine

## 2014-05-05 ENCOUNTER — Encounter (HOSPITAL_COMMUNITY): Payer: Self-pay

## 2014-05-05 DIAGNOSIS — Z8661 Personal history of infections of the central nervous system: Secondary | ICD-10-CM | POA: Insufficient documentation

## 2014-05-05 DIAGNOSIS — M542 Cervicalgia: Secondary | ICD-10-CM | POA: Diagnosis not present

## 2014-05-05 DIAGNOSIS — R51 Headache: Secondary | ICD-10-CM | POA: Diagnosis present

## 2014-05-05 DIAGNOSIS — I10 Essential (primary) hypertension: Secondary | ICD-10-CM | POA: Diagnosis not present

## 2014-05-05 DIAGNOSIS — E119 Type 2 diabetes mellitus without complications: Secondary | ICD-10-CM | POA: Insufficient documentation

## 2014-05-05 DIAGNOSIS — Z7951 Long term (current) use of inhaled steroids: Secondary | ICD-10-CM | POA: Insufficient documentation

## 2014-05-05 DIAGNOSIS — F329 Major depressive disorder, single episode, unspecified: Secondary | ICD-10-CM | POA: Insufficient documentation

## 2014-05-05 DIAGNOSIS — Z720 Tobacco use: Secondary | ICD-10-CM | POA: Insufficient documentation

## 2014-05-05 DIAGNOSIS — Z792 Long term (current) use of antibiotics: Secondary | ICD-10-CM | POA: Diagnosis not present

## 2014-05-05 DIAGNOSIS — Z79899 Other long term (current) drug therapy: Secondary | ICD-10-CM | POA: Insufficient documentation

## 2014-05-05 DIAGNOSIS — B349 Viral infection, unspecified: Secondary | ICD-10-CM | POA: Diagnosis not present

## 2014-05-05 LAB — CBC WITH DIFFERENTIAL/PLATELET
BASOS ABS: 0 10*3/uL (ref 0.0–0.1)
BASOS PCT: 0 % (ref 0–1)
Eosinophils Absolute: 0 10*3/uL (ref 0.0–0.7)
Eosinophils Relative: 0 % (ref 0–5)
HCT: 44.4 % (ref 36.0–46.0)
Hemoglobin: 14.2 g/dL (ref 12.0–15.0)
Lymphocytes Relative: 10 % — ABNORMAL LOW (ref 12–46)
Lymphs Abs: 0.8 10*3/uL (ref 0.7–4.0)
MCH: 28.2 pg (ref 26.0–34.0)
MCHC: 32 g/dL (ref 30.0–36.0)
MCV: 88.3 fL (ref 78.0–100.0)
Monocytes Absolute: 0.3 10*3/uL (ref 0.1–1.0)
Monocytes Relative: 3 % (ref 3–12)
NEUTROS ABS: 6.9 10*3/uL (ref 1.7–7.7)
NEUTROS PCT: 87 % — AB (ref 43–77)
PLATELETS: 222 10*3/uL (ref 150–400)
RBC: 5.03 MIL/uL (ref 3.87–5.11)
RDW: 14.3 % (ref 11.5–15.5)
WBC: 7.9 10*3/uL (ref 4.0–10.5)

## 2014-05-05 LAB — RAPID STREP SCREEN (MED CTR MEBANE ONLY): Streptococcus, Group A Screen (Direct): NEGATIVE

## 2014-05-05 LAB — BASIC METABOLIC PANEL
ANION GAP: 8 (ref 5–15)
BUN: 15 mg/dL (ref 6–23)
CHLORIDE: 104 mmol/L (ref 96–112)
CO2: 27 mmol/L (ref 19–32)
Calcium: 8.9 mg/dL (ref 8.4–10.5)
Creatinine, Ser: 1.17 mg/dL — ABNORMAL HIGH (ref 0.50–1.10)
GFR, EST AFRICAN AMERICAN: 68 mL/min — AB (ref >90)
GFR, EST NON AFRICAN AMERICAN: 58 mL/min — AB (ref >90)
Glucose, Bld: 160 mg/dL — ABNORMAL HIGH (ref 70–99)
POTASSIUM: 3.7 mmol/L (ref 3.5–5.1)
Sodium: 139 mmol/L (ref 135–145)

## 2014-05-05 LAB — URINALYSIS, ROUTINE W REFLEX MICROSCOPIC
Bilirubin Urine: NEGATIVE
Glucose, UA: NEGATIVE mg/dL
KETONES UR: NEGATIVE mg/dL
Nitrite: NEGATIVE
PROTEIN: 100 mg/dL — AB
Specific Gravity, Urine: 1.02 (ref 1.005–1.030)
UROBILINOGEN UA: 1 mg/dL (ref 0.0–1.0)
pH: 6 (ref 5.0–8.0)

## 2014-05-05 LAB — URINE MICROSCOPIC-ADD ON

## 2014-05-05 MED ORDER — MORPHINE SULFATE 4 MG/ML IJ SOLN
4.0000 mg | Freq: Once | INTRAMUSCULAR | Status: AC
  Administered 2014-05-05: 4 mg via INTRAVENOUS
  Filled 2014-05-05: qty 1

## 2014-05-05 MED ORDER — ACETAMINOPHEN 325 MG PO TABS
650.0000 mg | ORAL_TABLET | Freq: Once | ORAL | Status: AC
  Administered 2014-05-05: 650 mg via ORAL
  Filled 2014-05-05: qty 2

## 2014-05-05 MED ORDER — SODIUM CHLORIDE 0.9 % IV SOLN
INTRAVENOUS | Status: DC
  Administered 2014-05-05: 10:00:00 via INTRAVENOUS

## 2014-05-05 MED ORDER — OXYCODONE-ACETAMINOPHEN 5-325 MG PO TABS: 1.0000 | ORAL_TABLET | ORAL | Status: AC | PRN

## 2014-05-05 MED ORDER — SODIUM CHLORIDE 0.9 % IV BOLUS (SEPSIS)
1000.0000 mL | Freq: Once | INTRAVENOUS | Status: AC
  Administered 2014-05-05: 1000 mL via INTRAVENOUS

## 2014-05-05 NOTE — ED Notes (Signed)
Pt made aware urine sample needed 

## 2014-05-05 NOTE — Discharge Instructions (Signed)
Return here tomorrow for a recheck in the morning. Return at once for severe neck stiffness with associated sensitivity to light, confusion, rash or any other problems. Use Motrin as directed for your temperature. You may also return if you feel worse  Viral Infections A viral infection can be caused by different types of viruses.Most viral infections are not serious and resolve on their own. However, some infections may cause severe symptoms and may lead to further complications. SYMPTOMS Viruses can frequently cause:  Minor sore throat.  Aches and pains.  Headaches.  Runny nose.  Different types of rashes.  Watery eyes.  Tiredness.  Cough.  Loss of appetite.  Gastrointestinal infections, resulting in nausea, vomiting, and diarrhea. These symptoms do not respond to antibiotics because the infection is not caused by bacteria. However, you might catch a bacterial infection following the viral infection. This is sometimes called a "superinfection." Symptoms of such a bacterial infection may include:  Worsening sore throat with pus and difficulty swallowing.  Swollen neck glands.  Chills and a high or persistent fever.  Severe headache.  Tenderness over the sinuses.  Persistent overall ill feeling (malaise), muscle aches, and tiredness (fatigue).  Persistent cough.  Yellow, green, or brown mucus production with coughing. HOME CARE INSTRUCTIONS   Only take over-the-counter or prescription medicines for pain, discomfort, diarrhea, or fever as directed by your caregiver.  Drink enough water and fluids to keep your urine clear or pale yellow. Sports drinks can provide valuable electrolytes, sugars, and hydration.  Get plenty of rest and maintain proper nutrition. Soups and broths with crackers or rice are fine. SEEK IMMEDIATE MEDICAL CARE IF:   You have severe headaches, shortness of breath, chest pain, neck pain, or an unusual rash.  You have uncontrolled vomiting,  diarrhea, or you are unable to keep down fluids.  You or your child has an oral temperature above 102 F (38.9 C), not controlled by medicine.  Your baby is older than 3 months with a rectal temperature of 102 F (38.9 C) or higher.  Your baby is 113 months old or younger with a rectal temperature of 100.4 F (38 C) or higher. MAKE SURE YOU:   Understand these instructions.  Will watch your condition.  Will get help right away if you are not doing well or get worse. Document Released: 11/29/2004 Document Revised: 05/14/2011 Document Reviewed: 06/26/2010 [facility] CenterExitCare Patient Information 2015 [place], [place]. This information is not intended to replace advice given to you by your health care provider. Make sure you discuss any questions you have with your health care provider.
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
## 2014-05-05 NOTE — ED Notes (Signed)
Pt able to provide urine sample, menses has started

## 2014-05-05 NOTE — ED Provider Notes (Signed)
CSN: [id number]     Arrival date & time 05/05/14  0831 History   First MD Initiated Contact with Patient 05/05/14 [phone]     Chief Complaint  Patient presents with  . Headache  . Sore Throat     (Consider location/radiation/quality/duration/timing/severity/associated sxs/prior Treatment) HPI Comments: Patient here complaining of 3 days of URI symptoms consisting of sore throat and nasal congestion. She also notes diffuse body myalgias with nausea but no vomiting or diarrhea. Has had a mild headache. Does note some neck discomfort. Denies any photophobia or confusion. No rashes appreciated. History of meningitis in the past. She did take a dose of doxycycline yesterday and she has been using Motrin to control her temperature. Denies any abdominal pain but does note some dysuria. Nothing makes her symptoms better.  Patient is a 39 y.o. female presenting with headaches and pharyngitis. The history is provided by the patient.  Headache Sore Throat Associated symptoms include headaches.    Past Medical History  Diagnosis Date  . Diabetes mellitus   . Hypertension   . Fibromyalgia lupus  . Depression   . Meningitis    Past Surgical History  Procedure Laterality Date  . Hand surgery    . Foot fracture surgery    . Cesarean section  08/30/2011    Procedure: CESAREAN SECTION;  Surgeon: Bing Plume, MD;  Location: [facility];  Service: Gynecology;  Laterality: N/A;  . Foot surgery      left   . Hand surgery      left  . Cesarean section    . Laparoscopic tubal ligation N/A 05/28/2012    Procedure: LAPAROSCOPIC TUBAL LIGATION;  Surgeon: Bing Plume, MD;  Location: [facility];  Service: Gynecology;  Laterality: N/A;  laparoscopic tubal cauterization   Family History  Problem Relation Age of Onset  . Hypertension Father   . Diabetes Father   . Drug abuse Father   . Bipolar disorder Sister    History  Substance Use Topics  . Smoking status: Current Every Day Smoker -- 0.25 packs/day  for 10 years    Types: Cigarettes  . Smokeless tobacco: Never Used  . Alcohol Use: No   OB History    Gravida Para Term Preterm AB TAB SAB Ectopic Multiple Living   0 0 0 0 0 0 1     Review of Systems  Neurological: Positive for headaches.  All other systems reviewed and are negative.     Allergies  Influenza vaccines  Home Medications   Prior to Admission medications   Medication Sig Start Date End Date Taking? Authorizing Provider  ALPRAZolam Prudy Feeler) 1 MG tablet Take 1 mg by mouth at bedtime as needed for sleep.    Historical Provider, MD  amoxicillin-clavulanate (AUGMENTIN) 875-125 MG per tablet Take 1 tablet by mouth 2 (two) times daily. 12/01/12   Cathlyn Parsons, NP  Calcium-Magnesium-Vitamin D (CALCIUM 500 PO) Take by mouth.    Historical Provider, MD  fexofenadine (ALLEGRA) 180 MG tablet Take 1 tablet (180 mg total) by mouth daily. 10/28/11 10/27/12  Linna Hoff, MD  fluticasone (FLONASE) 50 MCG/ACT nasal spray Place 1 spray into the nose 2 (two) times daily. 10/28/11 10/27/12  Linna Hoff, MD  FOLIC ACID PO Take by mouth.    Historical Provider, MD  ibuprofen (ADVIL,MOTRIN) 800 MG tablet Take 1 tablet (800 mg total) by mouth 3 (three) times daily. 04/01/12   Linna Hoff, MD  OVER THE COUNTER  MEDICATION Blood pressure medicine    Historical Provider, MD  PREDNISONE PO Take by mouth.    Historical Provider, MD  sodium chloride (OCEAN) 0.65 % SOLN nasal spray Place 1 spray into the nose as needed for congestion. 12/01/12   Cathlyn ParsonsAngela M Kabbe, NP  traMADol (ULTRAM) 50 MG tablet Take 1 tablet (50 mg total) by mouth every 6 (six) hours as needed. 07/05/13   Rodolph BongEvan S Corey, MD  valACYclovir (VALTREX) 1000 MG tablet Take 1 tablet (1,000 mg total) by mouth 3 (three) times daily. 07/05/13   Rodolph BongEvan S Corey, MD   BP 136/83 mmHg  Pulse 116  Temp(Src) 100.3 F (37.9 C) (Oral)  Resp 20  SpO2 100%  LMP 05/05/2014 Physical Exam  Constitutional: She is oriented to person, place, and time.  She appears well-developed and well-nourished.  Non-toxic appearance. No distress.  HENT:  Head: Normocephalic and atraumatic.  Eyes: Conjunctivae, EOM and lids are normal. Pupils are equal, round, and reactive to light.  Neck: Normal range of motion. Muscular tenderness present. No rigidity. No tracheal deviation and normal range of motion present. No thyroid mass present.  Cardiovascular: Normal rate, regular rhythm and normal heart sounds.  Exam reveals no gallop.   No murmur heard. Pulmonary/Chest: Effort normal and breath sounds normal. No stridor. No respiratory distress. She has no decreased breath sounds. She has no wheezes. She has no rhonchi. She has no rales.  Abdominal: Soft. Normal appearance and bowel sounds are normal. She exhibits no distension. There is no tenderness. There is no rebound and no CVA tenderness.  Musculoskeletal: Normal range of motion. She exhibits no edema or tenderness.  Neurological: She is alert and oriented to person, place, and time. She has normal strength. No cranial nerve deficit or sensory deficit. GCS eye subscore is 4. GCS verbal subscore is 5. GCS motor subscore is 6.  Skin: Skin is warm and dry. No abrasion and no rash noted.  Psychiatric: She has a normal mood and affect. Her speech is normal and behavior is normal.  Nursing note and vitals reviewed.   ED Course  Procedures (including critical care time) Labs Review Labs Reviewed  URINE CULTURE  RAPID STREP SCREEN  CBC WITH DIFFERENTIAL/PLATELET  BASIC METABOLIC PANEL  URINALYSIS, ROUTINE W REFLEX MICROSCOPIC    Imaging Review No results found.   EKG Interpretation None      MDM   Final diagnoses:  None    Patient without meningeal signs at this time. She has no photophobia.I however did speak with her about the possibility of doing a lumbar puncture and she agrees to wait and have it done if she feels worse. I do not think that she has meningitis. Feels better after the  medicated. Return precautions given and I have encouraged the patient to see me tomorrow for recheck.    Toy BakerAnthony T Neeko Pharo, MD 05/05/14 [phone]

## 2014-05-05 NOTE — ED Notes (Signed)
Bed: WA12 Expected date:  Expected time:  Means of arrival:  Comments: 

## 2014-05-06 ENCOUNTER — Emergency Department (HOSPITAL_COMMUNITY): Payer: BLUE CROSS/BLUE SHIELD

## 2014-05-06 ENCOUNTER — Encounter (HOSPITAL_COMMUNITY): Payer: Self-pay

## 2014-05-06 ENCOUNTER — Inpatient Hospital Stay (HOSPITAL_COMMUNITY)
Admission: EM | Admit: 2014-05-06 | Discharge: 2014-05-08 | DRG: 103 | Disposition: A | Discharge status: Home / Self Care | Payer: BLUE CROSS/BLUE SHIELD | Attending: Family Medicine | Admitting: Family Medicine

## 2014-05-06 DIAGNOSIS — R51 Headache: Principal | ICD-10-CM | POA: Diagnosis present

## 2014-05-06 DIAGNOSIS — Z7951 Long term (current) use of inhaled steroids: Secondary | ICD-10-CM

## 2014-05-06 DIAGNOSIS — F419 Anxiety disorder, unspecified: Secondary | ICD-10-CM | POA: Diagnosis present

## 2014-05-06 DIAGNOSIS — M1990 Unspecified osteoarthritis, unspecified site: Secondary | ICD-10-CM | POA: Diagnosis present

## 2014-05-06 DIAGNOSIS — B9620 Unspecified Escherichia coli [E. coli] as the cause of diseases classified elsewhere: Secondary | ICD-10-CM | POA: Diagnosis present

## 2014-05-06 DIAGNOSIS — M797 Fibromyalgia: Secondary | ICD-10-CM | POA: Diagnosis present

## 2014-05-06 DIAGNOSIS — N390 Urinary tract infection, site not specified: Secondary | ICD-10-CM | POA: Diagnosis present

## 2014-05-06 DIAGNOSIS — Z887 Allergy status to serum and vaccine status: Secondary | ICD-10-CM

## 2014-05-06 DIAGNOSIS — Z79899 Other long term (current) drug therapy: Secondary | ICD-10-CM

## 2014-05-06 DIAGNOSIS — I10 Essential (primary) hypertension: Secondary | ICD-10-CM | POA: Diagnosis present

## 2014-05-06 DIAGNOSIS — Z8661 Personal history of infections of the central nervous system: Secondary | ICD-10-CM

## 2014-05-06 DIAGNOSIS — R509 Fever, unspecified: Secondary | ICD-10-CM | POA: Diagnosis present

## 2014-05-06 DIAGNOSIS — Z8249 Family history of ischemic heart disease and other diseases of the circulatory system: Secondary | ICD-10-CM

## 2014-05-06 DIAGNOSIS — F329 Major depressive disorder, single episode, unspecified: Secondary | ICD-10-CM | POA: Diagnosis present

## 2014-05-06 DIAGNOSIS — Z79891 Long term (current) use of opiate analgesic: Secondary | ICD-10-CM

## 2014-05-06 DIAGNOSIS — Z833 Family history of diabetes mellitus: Secondary | ICD-10-CM

## 2014-05-06 DIAGNOSIS — E1165 Type 2 diabetes mellitus with hyperglycemia: Secondary | ICD-10-CM | POA: Diagnosis present

## 2014-05-06 DIAGNOSIS — Z791 Long term (current) use of non-steroidal anti-inflammatories (NSAID): Secondary | ICD-10-CM

## 2014-05-06 DIAGNOSIS — R519 Headache, unspecified: Secondary | ICD-10-CM | POA: Diagnosis present

## 2014-05-06 DIAGNOSIS — F17210 Nicotine dependence, cigarettes, uncomplicated: Secondary | ICD-10-CM | POA: Diagnosis present

## 2014-05-06 LAB — CBC WITH DIFFERENTIAL/PLATELET
BASOS ABS: 0 10*3/uL (ref 0.0–0.1)
BASOS PCT: 0 % (ref 0–1)
EOS ABS: 0 10*3/uL (ref 0.0–0.7)
Eosinophils Relative: 0 % (ref 0–5)
HEMATOCRIT: 38.5 % (ref 36.0–46.0)
Hemoglobin: 12.1 g/dL (ref 12.0–15.0)
Lymphocytes Relative: 21 % (ref 12–46)
Lymphs Abs: 1.2 10*3/uL (ref 0.7–4.0)
MCH: 27.8 pg (ref 26.0–34.0)
MCHC: 31.4 g/dL (ref 30.0–36.0)
MCV: 88.3 fL (ref 78.0–100.0)
Monocytes Absolute: 0.3 10*3/uL (ref 0.1–1.0)
Monocytes Relative: 5 % (ref 3–12)
Neutro Abs: 4.3 10*3/uL (ref 1.7–7.7)
Neutrophils Relative %: 74 % (ref 43–77)
PLATELETS: 184 10*3/uL (ref 150–400)
RBC: 4.36 MIL/uL (ref 3.87–5.11)
RDW: 14.2 % (ref 11.5–15.5)
WBC: 5.8 10*3/uL (ref 4.0–10.5)

## 2014-05-06 LAB — COMPREHENSIVE METABOLIC PANEL
ALBUMIN: 3.5 g/dL (ref 3.5–5.2)
ALT: 13 U/L (ref 0–35)
AST: 18 U/L (ref 0–37)
Alkaline Phosphatase: 49 U/L (ref 39–117)
Anion gap: 5 (ref 5–15)
BILIRUBIN TOTAL: 0.6 mg/dL (ref 0.3–1.2)
BUN: 17 mg/dL (ref 6–23)
CHLORIDE: 107 mmol/L (ref 96–112)
CO2: 27 mmol/L (ref 19–32)
CREATININE: 1.04 mg/dL (ref 0.50–1.10)
Calcium: 8.4 mg/dL (ref 8.4–10.5)
GFR calc Af Amer: 78 mL/min — ABNORMAL LOW (ref >90)
GFR calc non Af Amer: 67 mL/min — ABNORMAL LOW (ref >90)
Glucose, Bld: 102 mg/dL — ABNORMAL HIGH (ref 70–99)
Potassium: 3.5 mmol/L (ref 3.5–5.1)
Sodium: 139 mmol/L (ref 135–145)
TOTAL PROTEIN: 7 g/dL (ref 6.0–8.3)

## 2014-05-06 LAB — URINE CULTURE: Colony Count: >=100000

## 2014-05-06 LAB — RAPID STREP SCREEN (MED CTR MEBANE ONLY): Streptococcus, Group A Screen (Direct): NEGATIVE

## 2014-05-06 LAB — PROTEIN, CSF: Total  Protein, CSF: 39 mg/dL (ref 15–45)

## 2014-05-06 LAB — I-STAT CG4 LACTIC ACID, ED: Lactic Acid, Venous: 0.93 mmol/L (ref 0.5–2.0)

## 2014-05-06 LAB — GLUCOSE, CSF: Glucose, CSF: 67 mg/dL (ref 43–76)

## 2014-05-06 MED ORDER — SODIUM CHLORIDE 0.9 % IV BOLUS (SEPSIS)
1000.0000 mL | Freq: Once | INTRAVENOUS | Status: AC
  Administered 2014-05-06: 1000 mL via INTRAVENOUS

## 2014-05-06 MED ORDER — MORPHINE SULFATE 4 MG/ML IJ SOLN
4.0000 mg | Freq: Once | INTRAMUSCULAR | Status: AC
  Administered 2014-05-06: 4 mg via INTRAVENOUS
  Filled 2014-05-06: qty 1

## 2014-05-06 MED ORDER — ACETAMINOPHEN 500 MG PO TABS
1000.0000 mg | ORAL_TABLET | Freq: Once | ORAL | Status: AC
  Administered 2014-05-06: 1000 mg via ORAL
  Filled 2014-05-06: qty 2

## 2014-05-06 MED ORDER — DEXTROSE 5 % IV SOLN
2.0000 g | Freq: Once | INTRAVENOUS | Status: AC
  Administered 2014-05-06: 2 g via INTRAVENOUS
  Filled 2014-05-06: qty 2

## 2014-05-06 MED ORDER — METOCLOPRAMIDE HCL 5 MG/ML IJ SOLN
10.0000 mg | Freq: Once | INTRAMUSCULAR | Status: AC
  Administered 2014-05-06: 10 mg via INTRAVENOUS
  Filled 2014-05-06: qty 2

## 2014-05-06 MED ORDER — LIDOCAINE HCL 2 % IJ SOLN
10.0000 mL | Freq: Once | INTRAMUSCULAR | Status: AC
  Administered 2014-05-06: 200 mg
  Filled 2014-05-06: qty 20

## 2014-05-06 MED ORDER — DIPHENHYDRAMINE HCL 50 MG/ML IJ SOLN
25.0000 mg | Freq: Once | INTRAMUSCULAR | Status: AC
  Administered 2014-05-06: 25 mg via INTRAVENOUS
  Filled 2014-05-06: qty 1

## 2014-05-06 NOTE — ED Notes (Addendum)
Patient reports headache x 1 week.  Seen here for same yesterday.  Reports feeling similar when she had meningitis in the past.  Has been using Mucinex in addition to other OTC medications.  Took 1000mg  Tylenol PTA.

## 2014-05-06 NOTE — ED Notes (Signed)
Patient transported to X-ray 

## 2014-05-06 NOTE — ED Notes (Signed)
Pt pored urine down sink in room no sample at this time RN notifited

## 2014-05-06 NOTE — ED Provider Notes (Signed)
CSN: [id number]     Arrival date & time 05/06/14  1935 History   First MD Initiated Contact with Patient 05/06/14 2034     Chief Complaint  Patient presents with  . Headache     (Consider location/radiation/quality/duration/timing/severity/associated sxs/prior Treatment) The history is provided by the patient.  Virginia Simmons is a 39 y.o. female hx of DM, HTN, fibromyalgia here with headache, sore throat, myalgias. She has headaches for the last week.  Intermittent headaches and is worse with movement.  Associated with some neck pain as well.  Has been taking Mucinex with no relief.  Associated with some sore throat and myalgias.  She  Was seen here yesterday was felt to have viral syndrome and gave strict return precautions that if she has worsening symptoms she should come back. Her headache is persistent, not resolved. Denies vomiting. Similar to previous meningitis in June last year.    Past Medical History  Diagnosis Date  . Diabetes mellitus   . Hypertension   . Fibromyalgia lupus  . Depression   . Meningitis    Past Surgical History  Procedure Laterality Date  . Hand surgery    . Foot fracture surgery    . Cesarean section  08/30/2011    Procedure: CESAREAN SECTION;  Surgeon: Bing Plumehomas F Henley, MD;  Location: [facility];  Service: Gynecology;  Laterality: N/A;  . Foot surgery      left   . Hand surgery      left  . Cesarean section    . Laparoscopic tubal ligation N/A 05/28/2012    Procedure: LAPAROSCOPIC TUBAL LIGATION;  Surgeon: Bing Plumehomas F Henley, MD;  Location: [facility];  Service: Gynecology;  Laterality: N/A;  laparoscopic tubal cauterization   Family History  Problem Relation Age of Onset  . Hypertension Father   . Diabetes Father   . Drug abuse Father   . Bipolar disorder Sister    History  Substance Use Topics  . Smoking status: Current Every Day Smoker -- 0.25 packs/day for 10 years    Types: Cigarettes  . Smokeless tobacco: Never Used  . Alcohol Use: No   OB  History    Gravida Para Term Preterm AB TAB SAB Ectopic Multiple Living   2 1 1  0 0 0 0 0 0 1     Review of Systems  HENT: Positive for sore throat.   Neurological: Positive for headaches.  All other systems reviewed and are negative.     Allergies  Influenza vaccines  Home Medications   Prior to Admission medications   Medication Sig Start Date End Date Taking? Authorizing Provider  ALPRAZolam Prudy Feeler(XANAX) 1 MG tablet Take 1 mg by mouth at bedtime as needed for sleep (sleep).    Yes Historical Provider, MD  atenolol-chlorthalidone (TENORETIC) 50-25 MG per tablet Take 1 tablet by mouth daily.   Yes Historical Provider, MD  predniSONE (DELTASONE) 20 MG tablet Take 10 mg by mouth daily with breakfast.   Yes Historical Provider, MD  amoxicillin-clavulanate (AUGMENTIN) 875-125 MG per tablet Take 1 tablet by mouth 2 (two) times daily. Patient not taking: Reported on 05/05/2014 12/01/12   Cathlyn ParsonsAngela M Kabbe, NP  fexofenadine (ALLEGRA) 180 MG tablet Take 1 tablet (180 mg total) by mouth daily. 10/28/11 10/27/12  Linna HoffJames D Kindl, MD  fluticasone (FLONASE) 50 MCG/ACT nasal spray Place 1 spray into the nose 2 (two) times daily. 10/28/11 10/27/12  Linna HoffJames D Kindl, MD  ibuprofen (ADVIL,MOTRIN) 800 MG tablet Take 1 tablet (800 mg  total) by mouth 3 (three) times daily. Patient not taking: Reported on 05/05/2014 04/01/12   Linna Hoff, MD  oxyCODONE-acetaminophen (PERCOCET/ROXICET) 5-325 MG per tablet Take 1-2 tablets by mouth every 4 (four) hours as needed for severe pain. 05/05/14   Toy Baker, MD  sodium chloride (OCEAN) 0.65 % SOLN nasal spray Place 1 spray into the nose as needed for congestion. Patient not taking: Reported on 05/05/2014 12/01/12   Cathlyn Parsons, NP  traMADol (ULTRAM) 50 MG tablet Take 1 tablet (50 mg total) by mouth every 6 (six) hours as needed. Patient not taking: Reported on 05/05/2014 07/05/13   Rodolph Bong, MD  valACYclovir (VALTREX) 1000 MG tablet Take 1 tablet (1,000 mg total) by mouth 3  (three) times daily. Patient not taking: Reported on 05/05/2014 07/05/13   Rodolph Bong, MD   BP 149/88 mmHg  Pulse 87  Temp(Src) 100.7 F (38.2 C) (Oral)  Resp 18  Ht  (1.626 m)  Wt 258 lb (117.028 kg)  BMI 44.26 kg/m2  SpO2 100%  LMP 05/05/2014 Physical Exam  Constitutional: She is oriented to person, place, and time. She appears well-developed and well-nourished.  HENT:  Head: Normocephalic.  OP slightly red   Eyes: Conjunctivae are normal. Pupils are equal, round, and reactive to light.  Neck: Normal range of motion.  Mild L cervical LAD. No obvious meningeal signs   Cardiovascular: Regular rhythm and normal heart sounds.   Slightly tachy   Pulmonary/Chest: Effort normal and breath sounds normal. No respiratory distress. She has no wheezes. She has no rales.  Abdominal: Soft. Bowel sounds are normal. She exhibits no distension. There is no tenderness. There is no rebound.  Musculoskeletal: Normal range of motion. She exhibits no edema or tenderness.  Neurological: She is alert and oriented to person, place, and time. No cranial nerve deficit. Coordination normal.  Skin: Skin is warm and dry.  Psychiatric: She has a normal mood and affect. Her behavior is normal. Judgment and thought content normal.  Nursing note and vitals reviewed.   ED Course  LUMBAR PUNCTURE Date/Time: 05/06/2014 10:31 PM Performed by: Richardean Canal Authorized by: Richardean Canal Consent: Verbal consent obtained. Risks and benefits: risks, benefits and alternatives were discussed Consent given by: patient Patient understanding: patient states understanding of the procedure being performed Patient consent: the patient's understanding of the procedure matches consent given Procedure consent: procedure consent matches procedure scheduled Relevant documents: relevant documents present and verified Patient identity confirmed: verbally with patient Time out: Immediately prior to procedure a "time out" was  called to verify the correct patient, procedure, equipment, support staff and site/side marked as required. Indications: evaluation for infection Anesthesia: local infiltration Local anesthetic: lidocaine 2% without epinephrine Anesthetic total: 15 ml Patient sedated: no Preparation: Patient was prepped and draped in the usual sterile fashion. Lumbar space: L3-L4 interspace Patient's position: sitting Needle gauge: 20 Needle type: diamond point Needle length: 3.5 in Number of attempts: 2 Fluid appearance: clear Tubes of fluid: 4 Total volume: 6 ml Post-procedure: site cleaned and pressure dressing applied Patient tolerance: Patient tolerated the procedure well with no immediate complications   (including critical care time)    Labs Review Labs Reviewed  COMPREHENSIVE METABOLIC PANEL - Abnormal; Notable for the following:    Glucose, Bld 102 (*)    GFR calc non Af Amer 67 (*)    GFR calc Af Amer 78 (*)    All other components within normal limits  RAPID  STREP SCREEN  CULTURE, GROUP A STREP  CSF CULTURE  GRAM STAIN  URINE CULTURE  CBC WITH DIFFERENTIAL/PLATELET  GLUCOSE, CSF  PROTEIN, CSF  CSF CELL COUNT WITH DIFFERENTIAL  CSF CELL COUNT WITH DIFFERENTIAL  URINALYSIS, ROUTINE W REFLEX MICROSCOPIC  PREGNANCY, URINE  I-STAT CG4 LACTIC ACID, ED  I-STAT CG4 LACTIC ACID, ED    Imaging Review Dg Neck Soft Tissue  05/06/2014   CLINICAL DATA:  Headache.  EXAM: NECK SOFT TISSUES - 1+ VIEW  COMPARISON:  None.  FINDINGS: There is no evidence of retropharyngeal soft tissue swelling or epiglottic enlargement. The cervical airway is unremarkable and no radio-opaque foreign body identified.  IMPRESSION: Negative.   Electronically Signed   By: Lupita Raider, M.D.   On: 05/06/2014 21:50   Dg Chest 2 View  05/06/2014   CLINICAL DATA:  Headaches and fever  EXAM: CHEST  2 VIEW  COMPARISON:  08/16/2012  FINDINGS: The heart size and mediastinal contours are within normal limits. Both lungs  are clear. The visualized skeletal structures are unremarkable.  IMPRESSION: No active cardiopulmonary disease.   Electronically Signed   By: Alcide Clever M.D.   On: 05/06/2014 21:50   Ct Head Wo Contrast  05/06/2014   CLINICAL DATA:  Headaches  EXAM: CT HEAD WITHOUT CONTRAST  TECHNIQUE: Contiguous axial images were obtained from the base of the skull through the vertex without intravenous contrast.  COMPARISON:  08/16/2012  FINDINGS: The bony calvarium is intact. No gross soft tissue abnormality is noted. No findings to suggest acute hemorrhage, acute infarction or space-occupying mass lesion are noted.  IMPRESSION: No acute intracranial abnormality noted.   Electronically Signed   By: Alcide Clever M.D.   On: 05/06/2014 22:00     EKG Interpretation None      MDM   Final diagnoses:  None    Virginia Simmons is a 39 y.o. female here with headache, persistent fever, myalgias. She has similar symptoms compared to meningitis in June. UA + Ecoli UTI yesterday. Consider symptoms from UTI vs meningitis. Given hx of meningitis, will repeat LP and do sepsis workup.   11:40 PM WBC nl. CT head unremarkable. LP performed. Given ceftriaxone empirically.   11:45 PM CSF results pending. It is slightly traumatic tap. If neg can dc with abx for UTI. Signed out to Dr. Lynelle Doctor.    Richardean Canal, MD 05/06/14 [phone]

## 2014-05-07 ENCOUNTER — Encounter (HOSPITAL_COMMUNITY): Payer: Self-pay | Admitting: Internal Medicine

## 2014-05-07 DIAGNOSIS — F419 Anxiety disorder, unspecified: Secondary | ICD-10-CM | POA: Diagnosis present

## 2014-05-07 DIAGNOSIS — F329 Major depressive disorder, single episode, unspecified: Secondary | ICD-10-CM | POA: Diagnosis present

## 2014-05-07 DIAGNOSIS — Z7951 Long term (current) use of inhaled steroids: Secondary | ICD-10-CM | POA: Diagnosis not present

## 2014-05-07 DIAGNOSIS — Z887 Allergy status to serum and vaccine status: Secondary | ICD-10-CM | POA: Diagnosis not present

## 2014-05-07 DIAGNOSIS — M1990 Unspecified osteoarthritis, unspecified site: Secondary | ICD-10-CM | POA: Diagnosis present

## 2014-05-07 DIAGNOSIS — E1165 Type 2 diabetes mellitus with hyperglycemia: Secondary | ICD-10-CM | POA: Diagnosis present

## 2014-05-07 DIAGNOSIS — M797 Fibromyalgia: Secondary | ICD-10-CM | POA: Diagnosis present

## 2014-05-07 DIAGNOSIS — Z8249 Family history of ischemic heart disease and other diseases of the circulatory system: Secondary | ICD-10-CM | POA: Diagnosis not present

## 2014-05-07 DIAGNOSIS — Z791 Long term (current) use of non-steroidal anti-inflammatories (NSAID): Secondary | ICD-10-CM | POA: Diagnosis not present

## 2014-05-07 DIAGNOSIS — Z833 Family history of diabetes mellitus: Secondary | ICD-10-CM | POA: Diagnosis not present

## 2014-05-07 DIAGNOSIS — R51 Headache: Secondary | ICD-10-CM | POA: Diagnosis present

## 2014-05-07 DIAGNOSIS — R509 Fever, unspecified: Secondary | ICD-10-CM

## 2014-05-07 DIAGNOSIS — Z79891 Long term (current) use of opiate analgesic: Secondary | ICD-10-CM | POA: Diagnosis not present

## 2014-05-07 DIAGNOSIS — F17210 Nicotine dependence, cigarettes, uncomplicated: Secondary | ICD-10-CM | POA: Diagnosis present

## 2014-05-07 DIAGNOSIS — I10 Essential (primary) hypertension: Secondary | ICD-10-CM | POA: Diagnosis present

## 2014-05-07 DIAGNOSIS — Z8661 Personal history of infections of the central nervous system: Secondary | ICD-10-CM | POA: Diagnosis not present

## 2014-05-07 DIAGNOSIS — R519 Headache, unspecified: Secondary | ICD-10-CM | POA: Insufficient documentation

## 2014-05-07 DIAGNOSIS — B9620 Unspecified Escherichia coli [E. coli] as the cause of diseases classified elsewhere: Secondary | ICD-10-CM | POA: Diagnosis present

## 2014-05-07 DIAGNOSIS — Z79899 Other long term (current) drug therapy: Secondary | ICD-10-CM | POA: Diagnosis not present

## 2014-05-07 DIAGNOSIS — N390 Urinary tract infection, site not specified: Secondary | ICD-10-CM | POA: Diagnosis present

## 2014-05-07 LAB — URINALYSIS, ROUTINE W REFLEX MICROSCOPIC
BILIRUBIN URINE: NEGATIVE
GLUCOSE, UA: NEGATIVE mg/dL
Ketones, ur: NEGATIVE mg/dL
Nitrite: NEGATIVE
Protein, ur: NEGATIVE mg/dL
Specific Gravity, Urine: 1.019 (ref 1.005–1.030)
Urobilinogen, UA: 0.2 mg/dL (ref 0.0–1.0)
pH: 5.5 (ref 5.0–8.0)

## 2014-05-07 LAB — COMPREHENSIVE METABOLIC PANEL
ALBUMIN: 3 g/dL — AB (ref 3.5–5.2)
ALT: 12 U/L (ref 0–35)
ANION GAP: 9 (ref 5–15)
AST: 20 U/L (ref 0–37)
Alkaline Phosphatase: 42 U/L (ref 39–117)
BUN: 14 mg/dL (ref 6–23)
CHLORIDE: 105 mmol/L (ref 96–112)
CO2: 25 mmol/L (ref 19–32)
Calcium: 7.7 mg/dL — ABNORMAL LOW (ref 8.4–10.5)
Creatinine, Ser: 0.9 mg/dL (ref 0.50–1.10)
GFR calc non Af Amer: 80 mL/min — ABNORMAL LOW (ref >90)
Glucose, Bld: 191 mg/dL — ABNORMAL HIGH (ref 70–99)
POTASSIUM: 3.1 mmol/L — AB (ref 3.5–5.1)
Sodium: 139 mmol/L (ref 135–145)
Total Bilirubin: 0.4 mg/dL (ref 0.3–1.2)
Total Protein: 6.5 g/dL (ref 6.0–8.3)

## 2014-05-07 LAB — CSF CELL COUNT WITH DIFFERENTIAL
RBC Count, CSF: 1533 /mm3 — ABNORMAL HIGH
RBC Count, CSF: 36 /mm3 — ABNORMAL HIGH
TUBE #: 4
Tube #: 1
WBC CSF: 5 /mm3 (ref 0–5)
WBC, CSF: 4 /mm3 (ref 0–5)

## 2014-05-07 LAB — CBC WITH DIFFERENTIAL/PLATELET
Basophils Absolute: 0 10*3/uL (ref 0.0–0.1)
Basophils Relative: 0 % (ref 0–1)
Eosinophils Absolute: 0 10*3/uL (ref 0.0–0.7)
Eosinophils Relative: 1 % (ref 0–5)
HCT: 36.9 % (ref 36.0–46.0)
Hemoglobin: 11.4 g/dL — ABNORMAL LOW (ref 12.0–15.0)
LYMPHS PCT: 27 % (ref 12–46)
Lymphs Abs: 1.3 10*3/uL (ref 0.7–4.0)
MCH: 27.6 pg (ref 26.0–34.0)
MCHC: 30.9 g/dL (ref 30.0–36.0)
MCV: 89.3 fL (ref 78.0–100.0)
MONO ABS: 0.3 10*3/uL (ref 0.1–1.0)
MONOS PCT: 6 % (ref 3–12)
Neutro Abs: 3.1 10*3/uL (ref 1.7–7.7)
Neutrophils Relative %: 66 % (ref 43–77)
Platelets: 195 10*3/uL (ref 150–400)
RBC: 4.13 MIL/uL (ref 3.87–5.11)
RDW: 14.3 % (ref 11.5–15.5)
WBC: 4.7 10*3/uL (ref 4.0–10.5)

## 2014-05-07 LAB — GRAM STAIN

## 2014-05-07 LAB — INFLUENZA PANEL BY PCR (TYPE A & B)
H1N1 flu by pcr: NOT DETECTED
INFLAPCR: NEGATIVE
Influenza B By PCR: NEGATIVE

## 2014-05-07 LAB — URINE MICROSCOPIC-ADD ON

## 2014-05-07 LAB — HIV ANTIBODY (ROUTINE TESTING W REFLEX): HIV SCREEN 4TH GENERATION: NONREACTIVE

## 2014-05-07 LAB — CG4 I-STAT (LACTIC ACID): LACTIC ACID, VENOUS: 1.08 mmol/L (ref 0.5–2.0)

## 2014-05-07 LAB — CULTURE, GROUP A STREP: Strep A Culture: NEGATIVE

## 2014-05-07 LAB — PREGNANCY, URINE: Preg Test, Ur: NEGATIVE

## 2014-05-07 MED ORDER — SODIUM CHLORIDE 0.9 % IV SOLN
1250.0000 mg | Freq: Three times a day (TID) | INTRAVENOUS | Status: DC
  Administered 2014-05-07 – 2014-05-08 (×4): 1250 mg via INTRAVENOUS
  Filled 2014-05-07 (×5): qty 1250

## 2014-05-07 MED ORDER — FLUTICASONE PROPIONATE 50 MCG/ACT NA SUSP
1.0000 | Freq: Two times a day (BID) | NASAL | Status: DC
  Administered 2014-05-07 – 2014-05-08 (×3): 1 via NASAL
  Filled 2014-05-07: qty 16

## 2014-05-07 MED ORDER — ACETAMINOPHEN 650 MG RE SUPP: 650.0000 mg | Freq: Four times a day (QID) | RECTAL | Status: DC | PRN

## 2014-05-07 MED ORDER — ATENOLOL 50 MG PO TABS
50.0000 mg | ORAL_TABLET | Freq: Every day | ORAL | Status: DC
  Administered 2014-05-07 – 2014-05-08 (×2): 50 mg via ORAL
  Filled 2014-05-07 (×2): qty 1

## 2014-05-07 MED ORDER — ACETAMINOPHEN 325 MG PO TABS
650.0000 mg | ORAL_TABLET | Freq: Four times a day (QID) | ORAL | Status: DC | PRN
  Administered 2014-05-07 – 2014-05-08 (×2): 650 mg via ORAL
  Filled 2014-05-07 (×2): qty 2

## 2014-05-07 MED ORDER — ALPRAZOLAM 1 MG PO TABS: 1.0000 mg | ORAL_TABLET | Freq: Every evening | ORAL | Status: DC | PRN

## 2014-05-07 MED ORDER — OXYCODONE-ACETAMINOPHEN 5-325 MG PO TABS
1.0000 | ORAL_TABLET | ORAL | Status: DC | PRN
  Administered 2014-05-07: 1 via ORAL
  Filled 2014-05-07: qty 1

## 2014-05-07 MED ORDER — CEFTRIAXONE SODIUM IN DEXTROSE 40 MG/ML IV SOLN
2.0000 g | Freq: Two times a day (BID) | INTRAVENOUS | Status: DC
  Administered 2014-05-07 – 2014-05-08 (×3): 2 g via INTRAVENOUS
  Filled 2014-05-07 (×5): qty 50

## 2014-05-07 MED ORDER — ONDANSETRON HCL 4 MG/2ML IJ SOLN: 4.0000 mg | Freq: Four times a day (QID) | INTRAMUSCULAR | Status: DC | PRN

## 2014-05-07 MED ORDER — ONDANSETRON HCL 4 MG PO TABS: 4.0000 mg | ORAL_TABLET | Freq: Four times a day (QID) | ORAL | Status: DC | PRN

## 2014-05-07 MED ORDER — POTASSIUM CHLORIDE 2 MEQ/ML IV SOLN
INTRAVENOUS | Status: DC
  Administered 2014-05-07 – 2014-05-08 (×2): via INTRAVENOUS
  Filled 2014-05-07 (×4): qty 1000

## 2014-05-07 MED ORDER — VANCOMYCIN HCL IN DEXTROSE 1-5 GM/200ML-% IV SOLN
1000.0000 mg | Freq: Once | INTRAVENOUS | Status: AC
  Administered 2014-05-07: 1000 mg via INTRAVENOUS
  Filled 2014-05-07: qty 200

## 2014-05-07 MED ORDER — SODIUM CHLORIDE 0.9 % IV SOLN
INTRAVENOUS | Status: DC
  Administered 2014-05-07: 04:00:00 via INTRAVENOUS

## 2014-05-07 MED ORDER — MORPHINE SULFATE 2 MG/ML IJ SOLN: 1.0000 mg | INTRAMUSCULAR | Status: DC | PRN

## 2014-05-07 MED ORDER — SODIUM CHLORIDE 0.9 % IV SOLN
2.0000 g | INTRAVENOUS | Status: DC
  Administered 2014-05-07 – 2014-05-08 (×9): 2 g via INTRAVENOUS
  Filled 2014-05-07 (×11): qty 2000

## 2014-05-07 NOTE — Progress Notes (Signed)
3:38 PM I agree with HPI/GPe and A/P per Dr. 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 She was seen in the ED on 05/05/14 thought to have UTI sent home with antibiotics  Returned and had headache and fever LP was performed Gram stain negative   HEENT EOMI NCAT CHEST clear no added sound CARDIAC S1-S2 no murmur rub or gallop ABDOMEN soft nontender nondistended no rebound NEURO intact no rebound reflexes are intact no Brudzinski sign no kernig sign  Patient Active Problem List   Diagnosis Date Noted  . Fever 05/07/2014  . Headache 05/07/2014  . 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 History of diabetes mellitus 08/17/2012  . Weight loss 08/17/2012  . Seasonal allergies 08/17/2012  . History of MRSA infection 08/17/2012  . Cigarette smoker 08/17/2012  . Bipolar I disorder, most recent episode (or current) depressed, unspecified 11/14/2011  . HTN (hypertension) 11/09/2011  . Anxiety and depression 05/01/2011    Class: Chronic   Continue empiric coverage with antibiotics for now-no organisms noted on Gram stain. cultures pending-given her improvement I think it is reasonable to meningitic doses of vancomycin and ampicillin and ceftriaxone  Rest as per history of present illness

## 2014-05-07 NOTE — H&P (Signed)
[facility] History and Physical  [name] RUE:[id number] DOB: 20-Jul-1975 DOA: 05/06/2014  Referring physician: ER physician. PCP: Erlinda Hong, MD  Chief Complaint: Headache and fever.  HPI: Virginia Simmons is a 38 y.o. female with history of hypertension, fibromyalgia/arthritis, hyperglycemia, anxiety has been experiencing headache and fever over the last 2 weeks. Patient also has been having symptoms of upper respiratory tract infection with runny nose sinus pressure and sore throat. Denies any shortness of breath or chest pain. Denies any nausea vomiting abdominal pain or diarrhea. Denies any recent travel. 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 Patient come to the ER 2 days ago and at that time UA showing possible UTI. Since patient's symptoms do not get better patient came to the ER again and at that time CT head was unremarkable. Lumbar puncture was done which at this time didn't show any organism show for with WBC count of 4 and 5 patient has been admitted for empiric antibiotics given patient's previous history of meningitis 2 years ago. Patient does not have any neck rigidity photophobia. Patient's headache is mostly in the frontal and around the sinuses.    Review of Systems: As presented in the history of presenting illness, rest negative.  Past Medical History  Diagnosis Date  . Diabetes mellitus   . Hypertension   . Fibromyalgia lupus  . Depression   . Meningitis    Past Surgical History  Procedure Laterality Date  . Hand surgery    . Foot fracture surgery    . Cesarean section  08/30/2011    Procedure: CESAREAN SECTION;  Surgeon: Bing Plume, MD;  Location: [facility];  Service: Gynecology;  Laterality: N/A;  . Foot surgery      left   . Hand surgery      left  . Cesarean section    . Laparoscopic tubal ligation N/A 05/28/2012    Procedure: LAPAROSCOPIC TUBAL LIGATION;  Surgeon: Bing Plume, MD;   Location: [facility];  Service: Gynecology;  Laterality: N/A;  laparoscopic tubal cauterization   Social History:  reports that she has been smoking Cigarettes.  She has a 2.5 pack-year smoking history. She has never used smokeless tobacco. She reports that she does not drink alcohol or use illicit drugs. Where does patient live home. Can patient participate in ADLs?yes.  Allergies  Allergen Reactions  . Influenza Vaccines Rash    Family History:  Family History  Problem Relation Age of Onset  . Hypertension Father   . Diabetes Father   . Drug abuse Father   . Bipolar disorder Sister       Prior to Admission medications   Medication Sig Start Date End Date Taking? Authorizing Provider  ALPRAZolam Prudy Feeler) 1 MG tablet Take 1 mg by mouth at bedtime as needed for sleep (sleep).    Yes Historical Provider, MD  atenolol-chlorthalidone (TENORETIC) 50-25 MG per tablet Take 1 tablet by mouth daily.   Yes Historical Provider, MD  predniSONE (DELTASONE) 20 MG tablet Take 10 mg by mouth daily with breakfast.   Yes Historical Provider, MD  amoxicillin-clavulanate (AUGMENTIN) 875-125 MG per tablet Take 1 tablet by mouth 2 (two) times daily. Patient not taking: Reported on 05/05/2014 12/01/12   Cathlyn Parsons, NP  fexofenadine (ALLEGRA) 180 MG tablet Take 1 tablet (180 mg total) by mouth daily. 10/28/11 10/27/12  Linna Hoff, MD  fluticasone (FLONASE) 50 MCG/ACT nasal spray Place 1 spray  into the nose 2 (two) times daily. 10/28/11 10/27/12  Linna Hoff, MD  ibuprofen (ADVIL,MOTRIN) 800 MG tablet Take 1 tablet (800 mg total) by mouth 3 (three) times daily. Patient not taking: Reported on 05/05/2014 04/01/12   Linna Hoff, MD  oxyCODONE-acetaminophen (PERCOCET/ROXICET) 5-325 MG per tablet Take 1-2 tablets by mouth every 4 (four) hours as needed for severe pain. 05/05/14   Toy Baker, MD  sodium chloride (OCEAN) 0.65 % SOLN nasal spray Place 1 spray into the nose as needed for congestion. Patient not  taking: Reported on 05/05/2014 12/01/12   Cathlyn Parsons, NP  traMADol (ULTRAM) 50 MG tablet Take 1 tablet (50 mg total) by mouth every 6 (six) hours as needed. Patient not taking: Reported on 05/05/2014 07/05/13   Rodolph Bong, MD  valACYclovir (VALTREX) 1000 MG tablet Take 1 tablet (1,000 mg total) by mouth 3 (three) times daily. Patient not taking: Reported on 05/05/2014 07/05/13   Rodolph Bong, MD    Physical Exam: Filed Vitals:   05/06/14 1938 05/06/14 2249 05/07/14 0302  BP: 132/114 149/88 109/67  Pulse: 109 87 84  Temp: 100.7 F (38.2 C)  98.3 F (36.8 C)  TempSrc: Oral  Oral  Resp: Height:  (1.626 m)   (1.626 m)  Weight: 117.028 kg (258 lb)  122.471 kg (270 lb)  SpO2: 100% 100% 100%     General Well-built and nourished.  Eyes: Anicteric no pallor.  ENT: No discharge from the ears eyes nose and mouth.  Neck: No neck rigidity.  Cardiovascular: S1-S2 heard.  Respiratory: No rhonchi or crepitations.  Abdomen: Soft nontender bowel sounds present.  Skin: No rash.  Musculoskeletal: No edema.  Psychiatric: Appears normal.  Neurologic: Alert awake oriented to time place and person. Moves all extremities.  Labs on Admission:  Basic Metabolic Panel:  Recent Labs Lab 05/05/14 0918 05/06/14 2106  NA 139 139  K 3.7 3.5  CL 104 107  CO2 27 27  GLUCOSE 160* 102*  BUN 15 17  CREATININE 1.17* 1.04  CALCIUM 8.9 8.4   Liver Function Tests:  Recent Labs Lab 05/06/14 2106  AST 18  ALT 13  ALKPHOS 49  BILITOT 0.6  PROT 7.0  ALBUMIN 3.5   No results for input(s): LIPASE, AMYLASE in the last 168 hours. No results for input(s): AMMONIA in the last 168 hours. CBC:  Recent Labs Lab 05/05/14 0918 05/06/14 2106  WBC 7.9 5.8  NEUTROABS 6.9 4.3  HGB 14.2 12.1  HCT 44.4 38.5  MCV 88.3 88.3  PLT 222 184   Cardiac Enzymes: No results for input(s): CKTOTAL, CKMB, CKMBINDEX, TROPONINI in the last 168 hours.  BNP (last 3 results) No results for  input(s): BNP in the last 8760 hours.  ProBNP (last 3 results) No results for input(s): PROBNP in the last 8760 hours.  CBG: No results for input(s): GLUCAP in the last 168 hours.  Radiological Exams on Admission: Dg Neck Soft Tissue  05/06/2014   CLINICAL DATA:  Headache.  EXAM: NECK SOFT TISSUES - 1+ VIEW  COMPARISON:  None.  FINDINGS: There is no evidence of retropharyngeal soft tissue swelling or epiglottic enlargement. The cervical airway is unremarkable and no radio-opaque foreign body identified.  IMPRESSION: Negative.   Electronically Signed   By: Lupita Raider, M.D.   On: 05/06/2014 21:50   Dg Chest 2 View  05/06/2014   CLINICAL DATA:  Headaches and fever  EXAM: CHEST  2 VIEW  COMPARISON:  08/16/2012  FINDINGS: The heart size and mediastinal contours are within normal limits. Both lungs are clear. The visualized skeletal structures are unremarkable.  IMPRESSION: No active cardiopulmonary disease.   Electronically Signed   By: Alcide CleverMark  Lukens M.D.   On: 05/06/2014 21:50   Ct Head Wo Contrast  05/06/2014   CLINICAL DATA:  Headaches  EXAM: CT HEAD WITHOUT CONTRAST  TECHNIQUE: Contiguous axial images were obtained from the base of the skull through the vertex without intravenous contrast.  COMPARISON:  08/16/2012  FINDINGS: The bony calvarium is intact. No gross soft tissue abnormality is noted. No findings to suggest acute hemorrhage, acute infarction or space-occupying mass lesion are noted.  IMPRESSION: No acute intracranial abnormality noted.   Electronically Signed   By: Alcide CleverMark  Lukens M.D.   On: 05/06/2014 22:00    Assessment/Plan Principal Problem:   Headache Active Problems:   HTN (hypertension)   Fever   1. Headache and fever - symptoms are most likely secondary to upper respiratory tract infection probably from viral. But given patient's previous history of meningitis at this time patient has been placed empirically on antibiotics until we get the final results of culture and  lumbar puncture. Continue with gentle hydration. Check influenza PCR, strep throat, HIV status and heterophile antibodies. Follow urinalysis and urine culture. May need to get infectious disease input in a.m. 2. Hypertension - continue atenolol and we will hold diuretics since patient is getting gentle hydration. 3. Hyperglycemia - patient takes steroids off-and-on for arthritis/fibromyalgia. Patient has taken prednisone yesterday and prior to that she has not taken it for last few weeks. Check hemoglobin A1c. 4. History of fibromyalgia/arthritis - patient states she has been previously on methotrexate and her rheumatologist has placed her on prednisone which she does not take it regularly. 5. Anxiety - continue present medications. 6. Tobacco abuse - advised to quit smoking.   DVT Prophylaxis SCDs.  Code Status: Full code.  Family Communication: None.  Disposition Plan: Admit to inpatient.    Daryl Quiros N. [facility] Pager [phone].  If 7PM-7AM, please contact night-coverage [email or web address] Password [email or web address] 05/07/2014, 3:34 AM

## 2014-05-07 NOTE — ED Provider Notes (Signed)
Pt left at change of shift to get results of her LP. Pt had a urine culture done yesterday when she was seen in the ED for headache showing contaminated specimen. Patient unfortunately poured her urine sample down the sink. In and out urinary sample was ordered.  I talked to the patient at 1:45 AM. She states she's feeling a little better. Her spinal fluid results were equivocal for acute meningitis. We discussed possible admission for IV antibiotics until her spinal fluid cultures return. She is agreeable.  2:16 AM Dr. Toniann FailKakrakandy admit patient to MedSurg bed, he wants her on Rocephin which she's already had and vancomycin. He wants two sets of blood cultures to be done and a influenza PCR test. .    Results for orders placed or performed during the hospital encounter of 05/06/14  Rapid strep screen  Result Value Ref Range   Streptococcus, Group A Screen (Direct) NEGATIVE NEGATIVE  Gram stain - STAT with CSF culture  Result Value Ref Range   Specimen Description CSF    Special Requests NONE    Gram Stain      CYTOSPIN WBC PRESENT, PREDOMINANTLY MONONUCLEAR NO ORGANISMS SEEN Gram Stain Report Called to,Read Back By and Verified With: B HARRIS RN [id number] 05/07/14 A NAVARRO    Report Status 05/07/2014 FINAL   CBC with Differential  Result Value Ref Range   WBC 5.8 4.0 - 10.5 K/uL   RBC 4.36 3.87 - 5.11 MIL/uL   Hemoglobin 12.1 12.0 - 15.0 g/dL   HCT 40.938.5 81.136.0 - 91.446.0 %   MCV 88.3 78.0 - 100.0 fL   MCH 27.8 26.0 - 34.0 pg   MCHC 31.4 30.0 - 36.0 g/dL   RDW 78.214.2 95.611.5 - 21.315.5 %   Platelets 184 150 - 400 K/uL   Neutrophils Relative % 74 43 - 77 %   Neutro Abs 4.3 1.7 - 7.7 K/uL   Lymphocytes Relative 21 12 - 46 %   Lymphs Abs 1.2 0.7 - 4.0 K/uL   Monocytes Relative 5 3 - 12 %   Monocytes Absolute 0.3 0.1 - 1.0 K/uL   Eosinophils Relative 0 0 - 5 %   Eosinophils Absolute 0.0 0.0 - 0.7 K/uL   Basophils Relative 0 0 - 1 %   Basophils Absolute 0.0 0.0 - 0.1 K/uL  Comprehensive metabolic  panel  Result Value Ref Range   Sodium 139 135 - 145 mmol/L   Potassium 3.5 3.5 - 5.1 mmol/L   Chloride 107 96 - 112 mmol/L   CO2 27 19 - 32 mmol/L   Glucose, Bld 102 (H) 70 - 99 mg/dL   BUN 17 6 - 23 mg/dL   Creatinine, Ser 0.861.04 0.50 - 1.10 mg/dL   Calcium 8.4 8.4 - 57.810.5 mg/dL   Total Protein 7.0 6.0 - 8.3 g/dL   Albumin 3.5 3.5 - 5.2 g/dL   AST 18 0 - 37 U/L   ALT 13 0 - 35 U/L   Alkaline Phosphatase 49 39 - 117 U/L   Total Bilirubin 0.6 0.3 - 1.2 mg/dL   GFR calc non Af Amer 67 (L) >90 mL/min   GFR calc Af Amer 78 (L) >90 mL/min   Anion gap 5 5 - 15  CSF cell count with differential collection tube #: 1  Result Value Ref Range   Tube # 1    Color, CSF COLORLESS COLORLESS   Appearance, CSF CLEAR (A) CLEAR   Supernatant NOT INDICATED    RBC Count, CSF 1533 (  H) 0 /cu mm   WBC, CSF 5 0 - 5 /cu mm   Other Cells, CSF TOO FEW TO COUNT, SMEAR AVAILABLE FOR REVIEW   CSF cell count with differential collection tube #: 4  Result Value Ref Range   Tube # 4    Color, CSF COLORLESS COLORLESS   Appearance, CSF CLEAR (A) CLEAR   Supernatant NOT INDICATED    RBC Count, CSF 36 (H) 0 /cu mm   WBC, CSF 4 0 - 5 /cu mm   Other Cells, CSF TOO FEW TO COUNT, SMEAR AVAILABLE FOR REVIEW   Glucose, CSF  Result Value Ref Range   Glucose, CSF 67 43 - 76 mg/dL  Protein, CSF  Result Value Ref Range   Total  Protein, CSF 39 15 - 45 mg/dL  Pregnancy, urine  Result Value Ref Range   Preg Test, Ur NEGATIVE NEGATIVE  I-Stat CG4 Lactic Acid, ED  Result Value Ref Range   Lactic Acid, Venous 0.93 0.5 - 2.0 mmol/L   Laboratory interpretation all normal except patient has some white blood cells (4 and 5) and white blood cells which were described as mono nuclear seen on her cytospin without organisms. Her glucose and protein are normal. At this point this is equivocal for meningitis.     Dg Neck Soft Tissue  05/06/2014   CLINICAL DATA:  Headache.  EXAM: NECK SOFT TISSUES - 1+ VIEW  COMPARISON:  None.   FINDINGS: There is no evidence of retropharyngeal soft tissue swelling or epiglottic enlargement. The cervical airway is unremarkable and no radio-opaque foreign body identified.  IMPRESSION: Negative.   Electronically Signed   By: Lupita Raider, M.D.   On: 05/06/2014 21:50   Dg Chest 2 View  05/06/2014   CLINICAL DATA:  Headaches and fever  EXAM: CHEST  2 VIEW  COMPARISON:  08/16/2012  FINDINGS: The heart size and mediastinal contours are within normal limits. Both lungs are clear. The visualized skeletal structures are unremarkable.  IMPRESSION: No active cardiopulmonary disease.   Electronically Signed   By: Alcide Clever M.D.   On: 05/06/2014 21:50   Ct Head Wo Contrast  05/06/2014   CLINICAL DATA:  Headaches  EXAM: CT HEAD WITHOUT CONTRAST  TECHNIQUE: Contiguous axial images were obtained from the base of the skull through the vertex without intravenous contrast.  COMPARISON:  08/16/2012  FINDINGS: The bony calvarium is intact. No gross soft tissue abnormality is noted. No findings to suggest acute hemorrhage, acute infarction or space-occupying mass lesion are noted.  IMPRESSION: No acute intracranial abnormality noted.   Electronically Signed   By: Alcide Clever M.D.   On: 05/06/2014 22:00        2d ago    Specimen Description URINE, RANDOM   Special Requests NONE   Colony Count >=100,000 COLONIES/ML  Performed at [facility]       Culture Multiple bacterial morphotypes present, none predominant. Suggest appropriate recollection if clinically indicated.  Performed at [facility]       Report Status 05/06/2014 FINAL   Resulting Agency [facility]      Specimen Collected: 05/05/14 10:31 AM        Diagnoses that have been ruled out:  None  Diagnoses that are still under consideration:  None  Final diagnoses:  Nonintractable episodic headache, unspecified headache type  Other specified fever    Plan admission   Devoria Albe, MD, Franz Dell,  MD 05/07/14 [phone]

## 2014-05-07 NOTE — Progress Notes (Signed)
UR complete 

## 2014-05-07 NOTE — Progress Notes (Signed)
Inpatient Diabetes Program Recommendations  AACE/ADA: New Consensus Statement on Inpatient Glycemic Control (2013)  Target Ranges:  Prepandial:   less than 140 mg/dL      Peak postprandial:   less than 180 mg/dL (1-2 hours)      Critically ill patients:  140 - 180 mg/dL     Results for Virginia Simmons, Virginia Simmons (MRN [id number]) as of 05/07/2014 09:03  Ref. Range 05/06/2014 21:06 05/07/2014 05:42  Glucose Latest Range: 70-99 mg/dL 409102 (H) 811191 (H)     Chief Complaint: Headache/ Fever  History: DM, HTN  Home DM Meds: None  Current DM Orders: None    **Note patient has History of DM.  H&P from previous admission (08/17/12) stated patient had History of DM but was managing her glucose levels without medications.  **Note patient takes PO steroids on and off for Fibromyalgia.  **Note A1c has been ordered.   MD- Please consider placing order for CBG checks and cover with Novolog Sensitive SSI if elevated while patient hospitalized      Will follow Ambrose FinlandJeannine Johnston Wynn Kernes RN, MSN, CDE Diabetes Coordinator Inpatient Diabetes Program Team Pager: [phone] (8a-10p)

## 2014-05-07 NOTE — Progress Notes (Signed)
ANTIBIOTIC CONSULT NOTE - INITIAL  Pharmacy Consult for Vancomycin, ceftriaxone, ampicillin Indication: Meningitis  Allergies  Allergen Reactions  . Influenza Vaccines Rash    Patient Measurements: Height: 5\' 4"  (162.6 cm) Weight: 270 lb (122.471 kg) IBW/kg (Calculated) : 54.7 Adjusted Body Weight:   Vital Signs: Temp: 98.3 F (36.8 C) (03/04 0302) Temp Source: Oral (03/04 0302) BP: 109/67 mmHg (03/04 0302) Pulse Rate: 84 (03/04 0302) Intake/Output from previous day:   Intake/Output from this shift:    Labs:  Recent Labs  05/05/14 0918 05/06/14 2106  WBC 7.9 5.8  HGB 14.2 12.1  PLT 222 184  CREATININE 1.17* 1.04   Estimated Creatinine Clearance: 94.7 mL/min (by C-G formula based on Cr of 1.04). No results for input(s): VANCOTROUGH, VANCOPEAK, VANCORANDOM, GENTTROUGH, GENTPEAK, GENTRANDOM, TOBRATROUGH, TOBRAPEAK, TOBRARND, AMIKACINPEAK, AMIKACINTROU, AMIKACIN in the last 72 hours.   Microbiology: Recent Results (from the past 720 hour(s))  Rapid strep screen     Status: None   Collection Time: 05/05/14  9:39 AM  Result Value Ref Range Status   Streptococcus, Group A Screen (Direct) NEGATIVE NEGATIVE Final    Comment: (NOTE) A Rapid Antigen test may result negative if the antigen level in the sample is below the detection level of this test. The FDA has not cleared this test as a stand-alone test therefore the rapid antigen negative result has reflexed to a Group A Strep culture.   Urine culture     Status: None   Collection Time: 05/05/14 10:31 AM  Result Value Ref Range Status   Specimen Description URINE, RANDOM  Final   Special Requests NONE  Final   Colony Count   Final    >=100,000 COLONIES/ML [facility]    Culture   Final    Multiple bacterial morphotypes present, none predominant. Suggest appropriate recollection if clinically indicated. [facility]    Report Status 05/06/2014 FINAL  Final  Rapid  strep screen     Status: None   Collection Time: 05/06/14  9:07 PM  Result Value Ref Range Status   Streptococcus, Group A Screen (Direct) NEGATIVE NEGATIVE Final    Comment: (NOTE) A Rapid Antigen test may result negative if the antigen level in the sample is below the detection level of this test. The FDA has not cleared this test as a stand-alone test therefore the rapid antigen negative result has reflexed to a Group A Strep culture.   Gram stain - STAT with CSF culture     Status: None   Collection Time: 05/06/14 10:18 PM  Result Value Ref Range Status   Specimen Description CSF  Final   Special Requests NONE  Final   Gram Stain   Final    CYTOSPIN WBC PRESENT, PREDOMINANTLY MONONUCLEAR NO ORGANISMS SEEN Gram Stain Report Called to,Read Back By and Verified WithRonda Fairly: B HARRIS RN 0017 05/07/14 A NAVARRO    Report Status 05/07/2014 FINAL  Final    Medical History: Past Medical History  Diagnosis Date  . Diabetes mellitus   . Hypertension   . Fibromyalgia lupus  . Depression   . Meningitis     Medications:  Anti-infectives    Start     Dose/Rate Route Frequency Ordered Stop   05/07/14 1000  cefTRIAXone (ROCEPHIN) 2 g in dextrose 5 % 50 mL IVPB - Premix     2 g 100 mL/hr over 30 Minutes Intravenous Every 12 hours 05/07/14 0347     05/07/14 0800  vancomycin (  VANCOCIN) 1,250 mg in sodium chloride 0.9 % 250 mL IVPB     1,250 mg 166.7 mL/hr over 90 Minutes Intravenous Every 8 hours 05/07/14 0349     05/07/14 0400  ampicillin (OMNIPEN) 2 g in sodium chloride 0.9 % 50 mL IVPB     2 g 150 mL/hr over 20 Minutes Intravenous 6 times per day 05/07/14 0333     05/07/14 0230  vancomycin (VANCOCIN) IVPB 1000 mg/200 mL premix     1,000 mg 200 mL/hr over 60 Minutes Intravenous  Once 05/07/14 0218     05/06/14 2230  cefTRIAXone (ROCEPHIN) 2 g in dextrose 5 % 50 mL IVPB     2 g 100 mL/hr over 30 Minutes Intravenous  Once 05/06/14 2215 05/06/14 2301     Assessment: Patient came into  ED with URI symptoms and headache.  Patient noted headache similar to prior hx of meningitis.  First dose of antibiotics already given.    Goal of Therapy:  Vancomycin trough level 15-20 mcg/ml  Ceftriaxone, ampicillin dosed based on patient weight and renal function   Plan:  Measure antibiotic drug levels at steady state Follow up culture results Vancomycin  iv q8hr, ceftriaxone 2gm iv q12hr, continue with ampicillin 2gm iv q4hr  Virginia Simmons, Virginia Simmons 05/07/2014,3:52 AM

## 2014-05-08 LAB — URINE CULTURE
Colony Count: NO GROWTH
Culture: NO GROWTH

## 2014-05-08 LAB — HETEROPHILE AB, REFLEX TO TITER, BLOOD: Heterophile Ab Screen: NEGATIVE

## 2014-05-08 LAB — HEMOGLOBIN A1C
HEMOGLOBIN A1C: 6.7 % — AB (ref 4.8–5.6)
MEAN PLASMA GLUCOSE: 146 mg/dL

## 2014-05-08 MED ORDER — HYDROCORTISONE NA SUCCINATE PF 100 MG IJ SOLR
50.0000 mg | Freq: Three times a day (TID) | INTRAMUSCULAR | Status: DC
  Administered 2014-05-08: 50 mg via INTRAVENOUS
  Filled 2014-05-08 (×3): qty 1

## 2014-05-08 MED ORDER — LEVOFLOXACIN 500 MG PO TABS: 500.0000 mg | ORAL_TABLET | Freq: Every day | ORAL | Status: AC

## 2014-05-08 NOTE — Progress Notes (Signed)
Pt left at this time with her "friend". Alert, oriented, and without c/o. Discharge instructions/prescription given/explained with pt verbalizing understanding.

## 2014-05-08 NOTE — Discharge Summary (Signed)
Physician Discharge Summary  Virginia Simmons NWG:[id number] DOB: 03/07/1975 DOA: 05/06/2014  PCP: Erlinda Hong, MD  Admit date: 05/06/2014 Discharge date: 05/08/2014  Time spent: 35 minutes  Recommendations for Outpatient Follow-up:  1. Patient complete 5 days of by mouth Levaquin 2. Patient encouraged to quit smoking 3. Please consider outpatient use of metformin for metabolic syndrome/possible diabetes mellitus 4. No pain med refills were given to the patient on discharge-this will need to be addressed with her primary physician and potentially her pain management physician   Discharge Diagnoses:  Principal Problem:   Headache Active Problems:   HTN (hypertension)   Fever   Cephalalgia   Other specified fever   Discharge Condition: fair  Diet recommendation: Regular  Filed Weights   05/06/14 1938 05/07/14 0302  Weight: 117.028 kg (258 lb) 122.471 kg (270 lb)    History of present illness:  39 y/o ?-HTN, fibromyalgia [?Lupus] system, prior bact meningitis 6/17 hyperglycemia, anxiety, bipolar type I, chronic smoker admitted 05/06/14 with 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 She was seen in the ED on 05/05/14 thought to have UTI sent home with antibiotics Returned and had headache and fever LP was performed Gram stain negative She defervescence quickly on IV antibiotics ampicillin/vancomycin/ceftriaxone and I discussed her case withDr. Dalbert Mayotte of infectious disease. It was felt based on clinical and [place]. Data that her negative Gram stain, 4 WBCs, normal glucose normal protein argued against bacterial meningitis and IV antibiotics were discontinued She was placed on by mouth Levaquin 500 to complete on 05/13/14  Note that her A1c was 6.7 which argues for diabetes mellitus on this admission but she had already been on steroids for a couple of days and it was felt that she would benefit from outpatient workup as she is mildly overweight  and probably has metabolic syndrome disease She was counseled to quit smoking She was restarted on discharge on her chlorthalidone/atenolol tablet for blood pressure She should follow-up with pain management for 4 her fibromyalgia and may benefit from methotrexate once again   Discharge Exam: Filed Vitals:   05/08/14 0700  BP: 111/87  Pulse: 77  Temp: 98.7 F (37.1 C)  Resp: 18    General: EOMI NCAT mild tenderness in the neck looks well Cardiovascular: S1-S2 no murmur rub or gallop Respiratory: Clinically clear  Discharge Instructions   Discharge Instructions    Diet - low sodium heart healthy    Complete by:  As directed      Discharge instructions    Complete by:  As directed   It is unlikely that you had meningitis I would recommend he follow up with your regular physician for routine counseling as you might have prediabetes I would recommend not using steroids for the time being Please complete a full course of Levaquin tablets as instructed In the future please do not stay ill without-being seen by a physician-go to an urgent care or [facility] to be seen     Increase activity slowly    Complete by:  As directed           Current Discharge Medication List    START taking these medications   Details  levofloxacin (LEVAQUIN) 500 MG tablet Take 1 tablet (500 mg total) by mouth daily. Qty: 5 tablet, Refills: 0      CONTINUE these medications which have NOT CHANGED   Details  ALPRAZolam (XANAX) 1 MG tablet Take 1 mg by mouth  at bedtime as needed for sleep (sleep).     atenolol-chlorthalidone (TENORETIC) 50-25 MG per tablet Take 1 tablet by mouth daily.    fexofenadine (ALLEGRA) 180 MG tablet Take 1 tablet (180 mg total) by mouth daily. Qty: 30 tablet, Refills: 1    fluticasone (FLONASE) 50 MCG/ACT nasal spray Place 1 spray into the nose 2 (two) times daily. Qty: 1 g, Refills: 2    ibuprofen (ADVIL,MOTRIN) 800 MG tablet Take 1 tablet (800 mg total) by  mouth 3 (three) times daily. Qty: 30 tablet, Refills: 0    oxyCODONE-acetaminophen (PERCOCET/ROXICET) 5-325 MG per tablet Take 1-2 tablets by mouth every 4 (four) hours as needed for severe pain. Qty: 10 tablet, Refills: 0    sodium chloride (OCEAN) 0.65 % SOLN nasal spray Place 1 spray into the nose as needed for congestion. Qty: 1 Bottle, Refills: 0    traMADol (ULTRAM) 50 MG tablet Take 1 tablet (50 mg total) by mouth every 6 (six) hours as needed. Qty: 15 tablet, Refills: 0    valACYclovir (VALTREX) 1000 MG tablet Take 1 tablet (1,000 mg total) by mouth 3 (three) times daily. Qty: 30 tablet, Refills: 0      STOP taking these medications     predniSONE (DELTASONE) 20 MG tablet      amoxicillin-clavulanate (AUGMENTIN) 875-125 MG per tablet        Allergies  Allergen Reactions  . Influenza Vaccines Rash      The results of significant diagnostics from this hospitalization (including imaging, microbiology, ancillary and laboratory) are listed below for reference.    Significant Diagnostic Studies: Dg Neck Soft Tissue  05/06/2014   CLINICAL DATA:  Headache.  EXAM: NECK SOFT TISSUES - 1+ VIEW  COMPARISON:  None.  FINDINGS: There is no evidence of retropharyngeal soft tissue swelling or epiglottic enlargement. The cervical airway is unremarkable and no radio-opaque foreign body identified.  IMPRESSION: Negative.   Electronically Signed   By: Lupita RaiderJames  Green Jr, M.D.   On: 05/06/2014 21:50   Dg Chest 2 View  05/06/2014   CLINICAL DATA:  Headaches and fever  EXAM: CHEST  2 VIEW  COMPARISON:  08/16/2012  FINDINGS: The heart size and mediastinal contours are within normal limits. Both lungs are clear. The visualized skeletal structures are unremarkable.  IMPRESSION: No active cardiopulmonary disease.   Electronically Signed   By: Alcide CleverMark  Lukens M.D.   On: 05/06/2014 21:50   Ct Head Wo Contrast  05/06/2014   CLINICAL DATA:  Headaches  EXAM: CT HEAD WITHOUT CONTRAST  TECHNIQUE: Contiguous  axial images were obtained from the base of the skull through the vertex without intravenous contrast.  COMPARISON:  08/16/2012  FINDINGS: The bony calvarium is intact. No gross soft tissue abnormality is noted. No findings to suggest acute hemorrhage, acute infarction or space-occupying mass lesion are noted.  IMPRESSION: No acute intracranial abnormality noted.   Electronically Signed   By: Alcide CleverMark  Lukens M.D.   On: 05/06/2014 22:00    Microbiology: Recent Results (from the past 240 hour(s))  Rapid strep screen     Status: None   Collection Time: 05/05/14  9:39 AM  Result Value Ref Range Status   Streptococcus, Group A Screen (Direct) NEGATIVE NEGATIVE Final    Comment: (NOTE) A Rapid Antigen test may result negative if the antigen level in the sample is below the detection level of this test. The FDA has not cleared this test as a stand-alone test therefore the rapid antigen negative result has  reflexed to a Group A Strep culture.   Culture, Group A Strep     Status: None   Collection Time: 05/05/14  9:39 AM  Result Value Ref Range Status   Strep A Culture Negative  Final    Comment: (NOTE) Performed At: [facility] [address] [place], [place] [id number] Mila Homer MD ON:[id number]   Urine culture     Status: None   Collection Time: 05/05/14 10:31 AM  Result Value Ref Range Status   Specimen Description URINE, RANDOM  Final   Special Requests NONE  Final   Colony Count   Final    >=100,000 COLONIES/ML Performed at [facility]    Culture   Final    Multiple bacterial morphotypes present, none predominant. Suggest appropriate recollection if clinically indicated. Performed at [facility]    Report Status 05/06/2014 FINAL  Final  Rapid strep screen     Status: None   Collection Time: 05/06/14  9:07 PM  Result Value Ref Range Status   Streptococcus, Group A Screen (Direct) NEGATIVE NEGATIVE Final    Comment: (NOTE) A Rapid Antigen test may  result negative if the antigen level in the sample is below the detection level of this test. The FDA has not cleared this test as a stand-alone test therefore the rapid antigen negative result has reflexed to a Group A Strep culture.   CSF culture     Status: None (Preliminary result)   Collection Time: 05/06/14 10:18 PM  Result Value Ref Range Status   Specimen Description CSF  Final   Special Requests NONE  Final   Gram Stain   Final    CYTOSPIN SLIDE WBC PRESENT, PREDOMINANTLY MONONUCLEAR NO ORGANISMS SEEN Gram Stain Report Called to,Read Back By and Verified With: Gram Stain Report Called to,Read Back By and Verified With: B HARRIS RN [phone] 05/07/14 BY A NAVARRO Performed at [facility] Performed at [facility]    Culture   Final    NO GROWTH 1 DAY Performed at [facility]    Report Status PENDING  Incomplete  Gram stain - STAT with CSF culture     Status: None   Collection Time: 05/06/14 10:18 PM  Result Value Ref Range Status   Specimen Description CSF  Final   Special Requests NONE  Final   Gram Stain   Final    CYTOSPIN WBC PRESENT, PREDOMINANTLY MONONUCLEAR NO ORGANISMS SEEN Gram Stain Report Called to,Read Back By and Verified With: B HARRIS RN [id number] 05/07/14 A NAVARRO    Report Status 05/07/2014 FINAL  Final  Blood culture (routine x 2)     Status: None (Preliminary result)   Collection Time: 05/07/14  2:30 AM  Result Value Ref Range Status   Specimen Description BLOOD LEFT HAND  Final   Special Requests BOTTLES DRAWN AEROBIC AND ANAEROBIC 5CC  Final   Culture   Final           BLOOD CULTURE RECEIVED NO GROWTH TO DATE CULTURE WILL BE HELD FOR 5 DAYS BEFORE ISSUING A FINAL NEGATIVE REPORT Performed at [facility]    Report Status PENDING  Incomplete  Blood culture (routine x 2)     Status: None (Preliminary result)   Collection Time: 05/07/14  2:30 AM  Result Value Ref Range Status   Specimen Description BLOOD RIGHT ANTECUBITAL   Final   Special Requests BOTTLES DRAWN AEROBIC AND ANAEROBIC 5CC  Final   Culture  Final           BLOOD CULTURE RECEIVED NO GROWTH TO DATE CULTURE WILL BE HELD FOR 5 DAYS BEFORE ISSUING A FINAL NEGATIVE REPORT Performed at [facility]    Report Status PENDING  Incomplete     Labs: Basic Metabolic Panel:  Recent Labs Lab 05/05/14 0918 05/06/14 2106 05/07/14 0542  NA 139 139 139  K 3.7 3.5 3.1*  CL 104 107 105  CO2 GLUCOSE 160* 102* 191*  BUN CREATININE 1.17* 1.04 0.90  CALCIUM 8.9 8.4 7.7*   Liver Function Tests:  Recent Labs Lab 05/06/14 2106 05/07/14 0542  AST 18 20  ALT 13 12  ALKPHOS 49 42  BILITOT 0.6 0.4  PROT 7.0 6.5  ALBUMIN 3.5 3.0*   No results for input(s): LIPASE, AMYLASE in the last 168 hours. No results for input(s): AMMONIA in the last 168 hours. CBC:  Recent Labs Lab 05/05/14 0918 05/06/14 2106 05/07/14 0542  WBC 7.9 5.8 4.7  NEUTROABS 6.9 4.3 3.1  HGB 14.2 12.1 11.4*  HCT 44.4 38.5 36.9  MCV 88.3 88.3 89.3  PLT 222 184 195   Cardiac Enzymes: No results for input(s): CKTOTAL, CKMB, CKMBINDEX, TROPONINI in the last 168 hours. BNP: BNP (last 3 results) No results for input(s): BNP in the last 8760 hours.  ProBNP (last 3 results) No results for input(s): PROBNP in the last 8760 hours.  CBG: No results for input(s): GLUCAP in the last 168 hours.     SignedRhetta Mura  [facility] 05/08/2014, 2:15 PM

## 2014-05-10 LAB — CSF CULTURE W GRAM STAIN: Culture: NO GROWTH

## 2014-05-10 LAB — CSF CULTURE

## 2014-05-10 LAB — CULTURE, GROUP A STREP: Strep A Culture: NEGATIVE

## 2014-05-13 LAB — CULTURE, BLOOD (ROUTINE X 2)
CULTURE: NO GROWTH
Culture: NO GROWTH

## 2014-05-13 LAB — CORTISOL, URINE, FREE

## 2014-05-19 ENCOUNTER — Other Ambulatory Visit: Payer: Self-pay | Admitting: Obstetrics and Gynecology

## 2015-06-19 ENCOUNTER — Ambulatory Visit (HOSPITAL_COMMUNITY)
Admission: EM | Admit: 2015-06-19 | Discharge: 2015-06-19 | Disposition: A | Discharge status: Home / Self Care | Payer: BLUE CROSS/BLUE SHIELD | Attending: Family Medicine | Admitting: Family Medicine

## 2015-06-19 ENCOUNTER — Encounter (HOSPITAL_COMMUNITY): Payer: Self-pay | Admitting: Nurse Practitioner

## 2015-06-19 DIAGNOSIS — B029 Zoster without complications: Secondary | ICD-10-CM | POA: Diagnosis not present

## 2015-06-19 HISTORY — DX: Unspecified osteoarthritis, unspecified site: M19.90

## 2015-06-19 MED ORDER — VALACYCLOVIR HCL 1 G PO TABS: 1000.0000 mg | ORAL_TABLET | Freq: Three times a day (TID) | ORAL | Status: AC

## 2015-06-19 NOTE — Discharge Instructions (Signed)
Shingles °Shingles is an infection that causes a painful skin rash and fluid-filled blisters. Shingles is caused by the same virus that causes chickenpox. °Shingles only develops in people who: °· Have had chickenpox. °· Have gotten the chickenpox vaccine. (This is rare.) °The first symptoms of shingles may be itching, tingling, or pain in an area on your skin. A rash will follow in a few days or weeks. The rash is usually on one side of the body in a bandlike or beltlike pattern. Over time, the rash turns into fluid-filled blisters that break open, scab over, and dry up. Medicines may: °· Help you manage pain. °· Help you recover more quickly. °· Help to prevent long-term problems. °HOME CARE °Medicines  °· Take medicines only as told by your doctor. °· Apply an anti-itch or numbing cream to the affected area as told by your doctor. °Blister and Rash Care  °· Take a cool bath or put cool compresses on the area of the rash or blisters as told by your doctor. This may help with pain and itching. °· Keep your rash covered with a loose bandage (dressing). Wear loose-fitting clothing. °· Keep your rash and blisters clean with mild soap and cool water or as told by your doctor. °· Check your rash every day for signs of infection. These include redness, swelling, and pain that lasts or gets worse. °· Do not pick your blisters. °· Do not scratch your rash. °General Instructions  °· Rest as told by your doctor. °· Keep all follow-up visits as told by your doctor. This is important. °· Until your blisters scab over, your infection can cause chickenpox in people who have never had it or been vaccinated against it. To prevent this from happening, avoid touching other people or being around other people, especially: °¨ Babies. °¨ Pregnant women. °¨ Children who have eczema. °¨ Elderly people who have transplants. °¨ People who have chronic illnesses, such as leukemia or AIDS. °GET HELP IF: °· Your pain does not get better with  medicine. °· Your pain does not get better after the rash heals. °· Your rash looks infected. Signs of infection include: °· Redness. °· Swelling. °· Pain that lasts or gets worse. °GET HELP RIGHT AWAY IF: °· The rash is on your face or nose. °· You have pain in your face, pain around your eye area, or loss of feeling on one side of your face. °· You have ear pain or you have ringing in your ear. °· You have loss of taste. °· Your condition gets worse. °  °This information is not intended to replace advice given to you by your health care provider. Make sure you discuss any questions you have with your health care provider. °  °Document Released: 08/08/2007 Document Revised: 03/12/2014 Document Reviewed: 12/01/2013 °Elsevier Interactive Patient Education ©2016 [employer] ° °Neuropathic Pain °Neuropathic pain is pain caused by damage to the nerves that are responsible for certain sensations in your body (sensory nerves). The pain can be caused by damage to:  °· The sensory nerves that send signals to your spinal cord and brain (peripheral nervous system). °· The sensory nerves in your brain or spinal cord (central nervous system). °Neuropathic pain can make you more sensitive to pain. What would be a minor sensation for most people may feel very painful if you have neuropathic pain. This is usually a long-term condition that can be difficult to treat. The type of pain can differ from person to person. It may start   suddenly (acute), or it may develop slowly and last for a long time (chronic). Neuropathic pain may come and go as damaged nerves heal or may stay at the same level for years. It often causes emotional distress, loss of sleep, and a lower quality of life. °CAUSES  °The most common cause of damage to a sensory nerve is diabetes. Many other diseases and conditions can also cause neuropathic pain. Causes of neuropathic pain can be classified as: °· Toxic. Many drugs and chemicals can cause toxic damage. The  most common cause of toxic neuropathic pain is damage from drug treatment for cancer (chemotherapy). °· Metabolic. This type of pain can happen when a disease causes imbalances that damage nerves. Diabetes is the most common of these diseases. Vitamin B deficiency caused by long-term alcohol abuse is another common cause. °· Traumatic. Any injury that cuts, crushes, or stretches a nerve can cause damage and pain. A common example is feeling pain after losing an arm or leg (phantom limb pain). °· Compression-related. If a sensory nerve gets trapped or compressed for a long period of time, the blood supply to the nerve can be cut off. °· Vascular. Many blood vessel diseases can cause neuropathic pain by decreasing blood supply and oxygen to nerves. °· Autoimmune. This type of pain results from diseases in which the body's defense system mistakenly attacks sensory nerves. Examples of autoimmune diseases that can cause neuropathic pain include lupus and multiple sclerosis. °· Infectious. Many types of viral infections can damage sensory nerves and cause pain. Shingles infection is a common cause of this type of pain. °· Inherited. Neuropathic pain can be a symptom of many diseases that are passed down through families (genetic). °SIGNS AND SYMPTOMS  °The main symptom is pain. Neuropathic pain is often described as: °· Burning. °· Shock-like. °· Stinging. °· Hot or cold. °· Itching. °DIAGNOSIS  °No single test can diagnose neuropathic pain. Your health care provider will do a physical exam and ask you about your pain. You may use a pain scale to describe how bad your pain is. You may also have tests to see if you have a high sensitivity to pain and to help find the cause and location of any sensory nerve damage. These tests may include: °· Imaging studies, such as: °¨ X-rays. °¨ CT scan. °¨ MRI. °· Nerve conduction studies to test how well nerve signals travel through your sensory nerves (electrodiagnostic  testing). °· Stimulating your sensory nerves through electrodes on your skin and measuring the response in your spinal cord and brain (somatosensory evoked potentials). °TREATMENT  °Treatment for neuropathic pain may change over time. You may need to try different treatment options or a combination of treatments. Some options include: °· Over-the-counter pain relievers. °· Prescription medicines. Some medicines used to treat other conditions may also help neuropathic pain. These include medicines to: °¨ Control seizures (anticonvulsants). °¨ Relieve depression (antidepressants). °· Prescription-strength pain relievers (narcotics). These are usually used when other pain relievers do not help. °· Transcutaneous nerve stimulation (TENS). This uses electrical currents to block painful nerve signals. The treatment is painless. °· Topical and local anesthetics. These are medicines that numb the nerves. They can be injected as a nerve block or applied to the skin. °· Alternative treatments, such as: °¨ Acupuncture. °¨ Meditation. °¨ Massage. °¨ Physical therapy. °¨ Pain management programs. °¨ Counseling. °HOME CARE INSTRUCTIONS °· Learn as much as you can about your condition. °· Take medicines only as directed by your health   care provider. °· Work closely with all your health care providers to find what works best for you. °· Have a good support system at home. °· Consider joining a chronic pain support group. °SEEK MEDICAL CARE IF: °· Your pain treatments are not helping. °· You are having side effects from your medicines. °· You are struggling with fatigue, mood changes, depression, or anxiety. °  °This information is not intended to replace advice given to you by your health care provider. Make sure you discuss any questions you have with your health care provider. °  °Document Released: 11/17/2003 Document Revised: 03/12/2014 Document Reviewed: 07/30/2013 °Elsevier Interactive Patient Education ©2016 [employer] ° °

## 2015-06-19 NOTE — ED Provider Notes (Signed)
CSN: [id number]     Arrival date & time 06/19/15  1920 History   First MD Initiated Contact with Patient 06/19/15 1934     Chief Complaint  Patient presents with  . Rash   (Consider location/radiation/quality/duration/timing/severity/associated sxs/prior Treatment) HPI History obtained from patient: Location:posterior right thigh Context/Duration last night noticed a burning pain, this morning a rash.  Severity:6  Quality: like previous shingles outbreak Timing:      constant       Home Treatment: none Associated symptoms:  pain  Past Medical History  Diagnosis Date  . Diabetes mellitus   . Hypertension   . Fibromyalgia lupus  . Depression   . Meningitis   . Arthritis    Past Surgical History  Procedure Laterality Date  . Hand surgery    . Foot fracture surgery    . Cesarean section  08/30/2011    Procedure: CESAREAN SECTION;  Surgeon: Bing Plumehomas F Henley, MD;  Location: [facility];  Service: Gynecology;  Laterality: N/A;  . Foot surgery      left   . Hand surgery      left  . Cesarean section    . Laparoscopic tubal ligation N/A 05/28/2012    Procedure: LAPAROSCOPIC TUBAL LIGATION;  Surgeon: Bing Plumehomas F Henley, MD;  Location: [facility];  Service: Gynecology;  Laterality: N/A;  laparoscopic tubal cauterization   Family History  Problem Relation Age of Onset  . Hypertension Father   . Diabetes Father   . Drug abuse Father   . Bipolar disorder Sister    Social History  Substance Use Topics  . Smoking status: Current Every Day Smoker -- 0.25 packs/day for 10 years    Types: Cigarettes  . Smokeless tobacco: Never Used  . Alcohol Use: No   OB History    Gravida Para Term Preterm AB TAB SAB Ectopic Multiple Living   2 1 1  0 0 0 0 0 0 1     Review of Systems Sore rash on leg Allergies  Influenza vaccines  Home Medications   Prior to Admission medications   Medication Sig Start Date End Date Taking? Authorizing Provider  ALPRAZolam Prudy Feeler(XANAX) 1 MG tablet Take 1 mg by mouth  at bedtime as needed for sleep (sleep).     Historical Provider, MD  atenolol-chlorthalidone (TENORETIC) 50-25 MG per tablet Take 1 tablet by mouth daily.    Historical Provider, MD  fexofenadine (ALLEGRA) 180 MG tablet Take 1 tablet (180 mg total) by mouth daily. 10/28/11 10/27/12  Linna HoffJames D Kindl, MD  fluticasone (FLONASE) 50 MCG/ACT nasal spray Place 1 spray into the nose 2 (two) times daily. 10/28/11 10/27/12  Linna HoffJames D Kindl, MD  ibuprofen (ADVIL,MOTRIN) 600 MG tablet TAKE 1 TABLET (600 MG TOTAL) BY MOUTH EVERY 6 (SIX) HOURS AS NEEDED FOR PAIN. 05/20/14   Deirdre C Poe, CNM  ibuprofen (ADVIL,MOTRIN) 800 MG tablet Take 1 tablet (800 mg total) by mouth 3 (three) times daily. Patient not taking: Reported on 05/05/2014 04/01/12   Linna HoffJames D Kindl, MD  levofloxacin (LEVAQUIN) 500 MG tablet Take 1 tablet (500 mg total) by mouth daily. 05/08/14   Rhetta MuraJai-Gurmukh Samtani, MD  oxyCODONE-acetaminophen (PERCOCET/ROXICET) 5-325 MG per tablet Take 1-2 tablets by mouth every 4 (four) hours as needed for severe pain. 05/05/14   Lorre NickAnthony Allen, MD  sodium chloride (OCEAN) 0.65 % SOLN nasal spray Place 1 spray into the nose as needed for congestion. Patient not taking: Reported on 05/05/2014 12/01/12   Cathlyn ParsonsAngela M Kabbe, NP  traMADol (  ULTRAM) 50 MG tablet Take 1 tablet (50 mg total) by mouth every 6 (six) hours as needed. Patient not taking: Reported on 05/05/2014 07/05/13   Rodolph Bong, MD  valACYclovir (VALTREX) 1000 MG tablet Take 1 tablet (1,000 mg total) by mouth 3 (three) times daily. Patient not taking: Reported on 05/05/2014 07/05/13   Rodolph Bong, MD  valACYclovir (VALTREX) 1000 MG tablet Take 1 tablet (1,000 mg total) by mouth 3 (three) times daily. 06/19/15   Tharon Aquas, PA   Meds Ordered and Administered this Visit  Medications - No data to display  BP 110/82 mmHg  Pulse 91  Temp(Src) 98.2 F (36.8 C) (Oral)  Resp 12  SpO2 100% No data found.   Physical Exam NURSES NOTES AND VITAL SIGNS REVIEWED. CONSTITUTIONAL: Well  developed, well nourished, no acute distress HEENT: normocephalic, atraumatic EYES: Conjunctiva normal NECK:normal ROM, supple, no adenopathy PULMONARY:No respiratory distress, normal effort MUSCULOSKELETAL: Normal ROM of all extremities, posterior right thigh just above popliteal area, vesicles, tender, red,  SKIN: warm and dry without rash PSYCHIATRIC: Mood and affect, behavior are normal  ED Course  Procedures (including critical care time)  Labs Review Labs Reviewed - No data to display  Imaging Review No results found.   Visual Acuity Review  Right Eye Distance:   Left Eye Distance:   Bilateral Distance:    Right Eye Near:   Left Eye Near:    Bilateral Near:       rx valtrex  MDM   1. Shingles outbreak     Patient is reassured that there are no issues that require transfer to higher level of care at this time or additional tests. Patient is advised to continue home symptomatic treatment. Patient is advised that if there are new or worsening symptoms to attend the emergency department, contact primary care provider, or return to UC. Instructions of care provided discharged home in stable condition.    THIS NOTE WAS GENERATED USING A VOICE RECOGNITION SOFTWARE PROGRAM. ALL REASONABLE EFFORTS  WERE MADE TO PROOFREAD THIS DOCUMENT FOR ACCURACY.  I have verbally reviewed the discharge instructions with the patient. A printed AVS was given to the patient.  All questions were answered prior to discharge.      Tharon Aquas, PA 06/19/15 2015

## 2015-06-19 NOTE — ED Notes (Signed)
She c/o painful rash to posterior R leg onset yesterday. She applied ointment which worsened the pain.

## 2015-12-06 ENCOUNTER — Ambulatory Visit (HOSPITAL_COMMUNITY): Payer: BLUE CROSS/BLUE SHIELD | Admitting: Psychiatry

## 2016-07-15 ENCOUNTER — Encounter (HOSPITAL_COMMUNITY): Payer: Self-pay | Admitting: *Deleted

## 2016-07-15 ENCOUNTER — Ambulatory Visit (HOSPITAL_COMMUNITY)
Admission: EM | Admit: 2016-07-15 | Discharge: 2016-07-15 | Disposition: A | Discharge status: Home / Self Care | Payer: BLUE CROSS/BLUE SHIELD | Attending: Internal Medicine | Admitting: Internal Medicine

## 2016-07-15 DIAGNOSIS — M542 Cervicalgia: Secondary | ICD-10-CM | POA: Diagnosis not present

## 2016-07-15 MED ORDER — METAXALONE 800 MG PO TABS: 800.0000 mg | ORAL_TABLET | Freq: Three times a day (TID) | ORAL | 0 refills | Status: AC
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
## 2016-07-15 NOTE — Discharge Instructions (Addendum)
Anticipate gradual improvement in achiness/stiffness over the next several days; may actually get worse in the next 24 hours or so before starting to improve.  Mobic should help with discomfort, and ice for 5-10 minutes several times daily should help also.  Physical therapy will help if not starting to improve as expected.  Note for work today.  Prescription for metaxalone (muscle relaxer) sent to the [facility] on [address]

## 2016-11-15 ENCOUNTER — Ambulatory Visit: Payer: BLUE CROSS/BLUE SHIELD | Admitting: Dietician

## 2018-03-04 ENCOUNTER — Ambulatory Visit (HOSPITAL_COMMUNITY)
Admission: EM | Admit: 2018-03-04 | Discharge: 2018-03-04 | Discharge status: Against Medical Advice | Payer: BLUE CROSS/BLUE SHIELD

## 2018-03-04 NOTE — ED Triage Notes (Signed)
Pt called x3 without response. °

## 2018-03-04 NOTE — ED Notes (Signed)
Pt was called 3X with no answer

## 2018-03-05 ENCOUNTER — Other Ambulatory Visit: Payer: Self-pay

## 2018-03-05 ENCOUNTER — Encounter (HOSPITAL_COMMUNITY): Payer: Self-pay | Admitting: Emergency Medicine

## 2018-03-05 ENCOUNTER — Emergency Department (HOSPITAL_COMMUNITY): Payer: BLUE CROSS/BLUE SHIELD

## 2018-03-05 ENCOUNTER — Emergency Department (HOSPITAL_COMMUNITY)
Admission: EM | Admit: 2018-03-05 | Discharge: 2018-03-05 | Disposition: A | Discharge status: Home / Self Care | Payer: BLUE CROSS/BLUE SHIELD | Attending: Emergency Medicine | Admitting: Emergency Medicine

## 2018-03-05 DIAGNOSIS — M549 Dorsalgia, unspecified: Secondary | ICD-10-CM | POA: Diagnosis present

## 2018-03-05 DIAGNOSIS — R0789 Other chest pain: Secondary | ICD-10-CM | POA: Insufficient documentation

## 2018-03-05 DIAGNOSIS — M545 Low back pain, unspecified: Secondary | ICD-10-CM

## 2018-03-05 DIAGNOSIS — Z79899 Other long term (current) drug therapy: Secondary | ICD-10-CM | POA: Diagnosis not present

## 2018-03-05 DIAGNOSIS — R51 Headache: Secondary | ICD-10-CM | POA: Diagnosis not present

## 2018-03-05 DIAGNOSIS — F17210 Nicotine dependence, cigarettes, uncomplicated: Secondary | ICD-10-CM | POA: Diagnosis not present

## 2018-03-05 DIAGNOSIS — E119 Type 2 diabetes mellitus without complications: Secondary | ICD-10-CM | POA: Diagnosis not present

## 2018-03-05 DIAGNOSIS — M25511 Pain in right shoulder: Secondary | ICD-10-CM | POA: Diagnosis not present

## 2018-03-05 DIAGNOSIS — M25541 Pain in joints of right hand: Secondary | ICD-10-CM | POA: Diagnosis not present

## 2018-03-05 DIAGNOSIS — M79601 Pain in right arm: Secondary | ICD-10-CM | POA: Insufficient documentation

## 2018-03-05 DIAGNOSIS — M79602 Pain in left arm: Secondary | ICD-10-CM | POA: Diagnosis not present

## 2018-03-05 DIAGNOSIS — I10 Essential (primary) hypertension: Secondary | ICD-10-CM | POA: Insufficient documentation

## 2018-03-05 DIAGNOSIS — M7918 Myalgia, other site: Secondary | ICD-10-CM

## 2018-03-05 NOTE — ED Notes (Signed)
Bed: WTR6 Expected date:  Expected time:  Means of arrival:  Comments: 
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
## 2018-03-05 NOTE — Discharge Instructions (Addendum)
P[place] continue your home percocet and mobic for your symptoms.   You may use cool or warm compresses as well for your symptoms.  Please follow up with your primary care provider within 5-7 days for re-evaluation of your symptoms. If you do not have a primary care provider, information for a healthcare clinic has been provided for you to make arrangements for follow up care. Please return to the emergency department for any new or worsening symptoms.

## 2018-03-05 NOTE — ED Notes (Signed)
Bed: WTR7 Expected date:  Expected time:  Means of arrival:  Comments: 
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
## 2019-09-28 ENCOUNTER — Other Ambulatory Visit: Payer: Self-pay | Admitting: Nurse Practitioner

## 2019-09-28 DIAGNOSIS — Z1231 Encounter for screening mammogram for malignant neoplasm of breast: Secondary | ICD-10-CM

## 2019-10-13 ENCOUNTER — Other Ambulatory Visit: Payer: Self-pay

## 2019-10-13 ENCOUNTER — Ambulatory Visit
Admission: RE | Admit: 2019-10-13 | Discharge: 2019-10-13 | Disposition: A | Discharge status: Home / Self Care | Payer: BLUE CROSS/BLUE SHIELD | Source: Ambulatory Visit | Attending: *Deleted | Admitting: *Deleted

## 2019-10-13 DIAGNOSIS — Z1231 Encounter for screening mammogram for malignant neoplasm of breast: Secondary | ICD-10-CM

## 2020-02-19 DIAGNOSIS — G5603 Carpal tunnel syndrome, bilateral upper limbs: Secondary | ICD-10-CM | POA: Insufficient documentation

## 2020-02-19 DIAGNOSIS — E669 Obesity, unspecified: Secondary | ICD-10-CM | POA: Insufficient documentation

## 2020-07-10 ENCOUNTER — Emergency Department (HOSPITAL_COMMUNITY): Payer: Medicaid Other

## 2020-07-10 ENCOUNTER — Ambulatory Visit (HOSPITAL_COMMUNITY)
Admission: EM | Admit: 2020-07-10 | Discharge: 2020-07-10 | Disposition: A | Discharge status: Home / Self Care | Payer: Medicaid Other | Attending: Sports Medicine | Admitting: Sports Medicine

## 2020-07-10 ENCOUNTER — Emergency Department (HOSPITAL_COMMUNITY)
Admission: EM | Admit: 2020-07-10 | Discharge: 2020-07-10 | Disposition: A | Discharge status: Home / Self Care | Payer: Medicaid Other | Attending: Emergency Medicine | Admitting: Emergency Medicine

## 2020-07-10 ENCOUNTER — Encounter (HOSPITAL_COMMUNITY): Payer: Self-pay

## 2020-07-10 ENCOUNTER — Other Ambulatory Visit: Payer: Self-pay

## 2020-07-10 ENCOUNTER — Encounter (HOSPITAL_COMMUNITY): Payer: Self-pay | Admitting: Emergency Medicine

## 2020-07-10 DIAGNOSIS — Z79899 Other long term (current) drug therapy: Secondary | ICD-10-CM | POA: Diagnosis not present

## 2020-07-10 DIAGNOSIS — S3991XA Unspecified injury of abdomen, initial encounter: Secondary | ICD-10-CM | POA: Diagnosis present

## 2020-07-10 DIAGNOSIS — R110 Nausea: Secondary | ICD-10-CM | POA: Insufficient documentation

## 2020-07-10 DIAGNOSIS — S301XXA Contusion of abdominal wall, initial encounter: Secondary | ICD-10-CM | POA: Insufficient documentation

## 2020-07-10 DIAGNOSIS — E119 Type 2 diabetes mellitus without complications: Secondary | ICD-10-CM | POA: Insufficient documentation

## 2020-07-10 DIAGNOSIS — F17210 Nicotine dependence, cigarettes, uncomplicated: Secondary | ICD-10-CM | POA: Insufficient documentation

## 2020-07-10 DIAGNOSIS — R1084 Generalized abdominal pain: Secondary | ICD-10-CM

## 2020-07-10 DIAGNOSIS — I10 Essential (primary) hypertension: Secondary | ICD-10-CM | POA: Diagnosis not present

## 2020-07-10 LAB — I-STAT BETA HCG BLOOD, ED (MC, WL, AP ONLY): I-stat hCG, quantitative: <5 m[IU]/mL (ref <5)

## 2020-07-10 LAB — URINALYSIS, ROUTINE W REFLEX MICROSCOPIC
Bilirubin Urine: NEGATIVE
Glucose, UA: NEGATIVE mg/dL
Hgb urine dipstick: NEGATIVE
Ketones, ur: NEGATIVE mg/dL
Leukocytes,Ua: NEGATIVE
Nitrite: NEGATIVE
Protein, ur: NEGATIVE mg/dL
Specific Gravity, Urine: 1.013 (ref 1.005–1.030)
pH: 6 (ref 5.0–8.0)

## 2020-07-10 LAB — COMPREHENSIVE METABOLIC PANEL
ALT: 25 U/L (ref 0–44)
AST: 24 U/L (ref 15–41)
Albumin: 3.7 g/dL (ref 3.5–5.0)
Alkaline Phosphatase: 58 U/L (ref 38–126)
Anion gap: 7 (ref 5–15)
BUN: 13 mg/dL (ref 6–20)
CO2: 28 mmol/L (ref 22–32)
Calcium: 9 mg/dL (ref 8.9–10.3)
Chloride: 104 mmol/L (ref 98–111)
Creatinine, Ser: 1.14 mg/dL — ABNORMAL HIGH (ref 0.44–1.00)
GFR, Estimated: >60 mL/min (ref >60)
Glucose, Bld: 78 mg/dL (ref 70–99)
Potassium: 3.7 mmol/L (ref 3.5–5.1)
Sodium: 139 mmol/L (ref 135–145)
Total Bilirubin: 0.7 mg/dL (ref 0.3–1.2)
Total Protein: 7.5 g/dL (ref 6.5–8.1)

## 2020-07-10 LAB — CBC
HCT: 42.2 % (ref 36.0–46.0)
Hemoglobin: 12.8 g/dL (ref 12.0–15.0)
MCH: 27.2 pg (ref 26.0–34.0)
MCHC: 30.3 g/dL (ref 30.0–36.0)
MCV: 89.8 fL (ref 80.0–100.0)
Platelets: 232 10*3/uL (ref 150–400)
RBC: 4.7 MIL/uL (ref 3.87–5.11)
RDW: 14.6 % (ref 11.5–15.5)
WBC: 5.9 10*3/uL (ref 4.0–10.5)
nRBC: 0 % (ref 0.0–0.2)

## 2020-07-10 LAB — LIPASE, BLOOD: Lipase: 33 U/L (ref 11–51)

## 2020-07-10 MED ORDER — FENTANYL CITRATE (PF) 100 MCG/2ML IJ SOLN
50.0000 ug | Freq: Once | INTRAMUSCULAR | Status: AC
  Administered 2020-07-10: 50 ug via INTRAVENOUS
  Filled 2020-07-10: qty 2

## 2020-07-10 MED ORDER — IOHEXOL 300 MG/ML  SOLN
75.0000 mL | Freq: Once | INTRAMUSCULAR | Status: AC | PRN
  Administered 2020-07-10: 75 mL via INTRAVENOUS

## 2020-07-10 MED ORDER — CYCLOBENZAPRINE HCL 10 MG PO TABS: 10.0000 mg | ORAL_TABLET | Freq: Two times a day (BID) | ORAL | 0 refills | Status: AC | PRN

## 2020-07-10 MED ORDER — ONDANSETRON HCL 4 MG/2ML IJ SOLN
4.0000 mg | Freq: Once | INTRAMUSCULAR | Status: AC
  Administered 2020-07-10: 4 mg via INTRAVENOUS
  Filled 2020-07-10: qty 2

## 2020-07-10 NOTE — ED Notes (Signed)
Patient is being discharged from the Urgent Care and sent to the Emergency Department via POV. Per Dr. Zachery Dauer, patient is in need of higher level of care due to abdominal pain. Patient is aware and verbalizes understanding of plan of care.  Vitals:   07/10/20 1337  BP: (!) 155/99  Pulse: 81  Resp: 20  Temp: 99.3 F (37.4 C)  SpO2: 99%
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
## 2020-11-06 ENCOUNTER — Emergency Department (HOSPITAL_COMMUNITY): Payer: Medicaid Other

## 2020-11-06 ENCOUNTER — Emergency Department (HOSPITAL_COMMUNITY)
Admission: EM | Admit: 2020-11-06 | Discharge: 2020-11-06 | Disposition: A | Discharge status: Home / Self Care | Payer: Medicaid Other | Attending: Emergency Medicine | Admitting: Emergency Medicine

## 2020-11-06 ENCOUNTER — Other Ambulatory Visit: Payer: Self-pay

## 2020-11-06 ENCOUNTER — Encounter (HOSPITAL_COMMUNITY): Payer: Self-pay

## 2020-11-06 DIAGNOSIS — E119 Type 2 diabetes mellitus without complications: Secondary | ICD-10-CM | POA: Diagnosis not present

## 2020-11-06 DIAGNOSIS — F17210 Nicotine dependence, cigarettes, uncomplicated: Secondary | ICD-10-CM | POA: Diagnosis not present

## 2020-11-06 DIAGNOSIS — I10 Essential (primary) hypertension: Secondary | ICD-10-CM | POA: Insufficient documentation

## 2020-11-06 DIAGNOSIS — Z79899 Other long term (current) drug therapy: Secondary | ICD-10-CM | POA: Diagnosis not present

## 2020-11-06 DIAGNOSIS — R1033 Periumbilical pain: Secondary | ICD-10-CM | POA: Insufficient documentation

## 2020-11-06 DIAGNOSIS — K219 Gastro-esophageal reflux disease without esophagitis: Secondary | ICD-10-CM | POA: Diagnosis not present

## 2020-11-06 DIAGNOSIS — N9489 Other specified conditions associated with female genital organs and menstrual cycle: Secondary | ICD-10-CM | POA: Diagnosis not present

## 2020-11-06 DIAGNOSIS — K5900 Constipation, unspecified: Secondary | ICD-10-CM | POA: Diagnosis present

## 2020-11-06 DIAGNOSIS — K56609 Unspecified intestinal obstruction, unspecified as to partial versus complete obstruction: Secondary | ICD-10-CM

## 2020-11-06 DIAGNOSIS — R1013 Epigastric pain: Secondary | ICD-10-CM | POA: Insufficient documentation

## 2020-11-06 LAB — CBC WITH DIFFERENTIAL/PLATELET
Abs Immature Granulocytes: 0.02 10*3/uL (ref 0.00–0.07)
Basophils Absolute: 0 10*3/uL (ref 0.0–0.1)
Basophils Relative: 0 %
Eosinophils Absolute: 0.1 10*3/uL (ref 0.0–0.5)
Eosinophils Relative: 1 %
HCT: 42.9 % (ref 36.0–46.0)
Hemoglobin: 13.4 g/dL (ref 12.0–15.0)
Immature Granulocytes: 0 %
Lymphocytes Relative: 24 %
Lymphs Abs: 1.2 10*3/uL (ref 0.7–4.0)
MCH: 27.7 pg (ref 26.0–34.0)
MCHC: 31.2 g/dL (ref 30.0–36.0)
MCV: 88.8 fL (ref 80.0–100.0)
Monocytes Absolute: 0.5 10*3/uL (ref 0.1–1.0)
Monocytes Relative: 11 %
Neutro Abs: 3.2 10*3/uL (ref 1.7–7.7)
Neutrophils Relative %: 64 %
Platelets: 180 10*3/uL (ref 150–400)
RBC: 4.83 MIL/uL (ref 3.87–5.11)
RDW: 14.9 % (ref 11.5–15.5)
WBC: 5.1 10*3/uL (ref 4.0–10.5)
nRBC: 0 % (ref 0.0–0.2)

## 2020-11-06 LAB — URINALYSIS, ROUTINE W REFLEX MICROSCOPIC
Bacteria, UA: NONE SEEN
Bilirubin Urine: NEGATIVE
Glucose, UA: NEGATIVE mg/dL
Hgb urine dipstick: NEGATIVE
Ketones, ur: NEGATIVE mg/dL
Leukocytes,Ua: NEGATIVE
Nitrite: NEGATIVE
Protein, ur: NEGATIVE mg/dL
Specific Gravity, Urine: 1.02 (ref 1.005–1.030)
pH: 8 (ref 5.0–8.0)

## 2020-11-06 LAB — COMPREHENSIVE METABOLIC PANEL
ALT: 20 U/L (ref 0–44)
AST: 20 U/L (ref 15–41)
Albumin: 3.6 g/dL (ref 3.5–5.0)
Alkaline Phosphatase: 61 U/L (ref 38–126)
Anion gap: 3 — ABNORMAL LOW (ref 5–15)
BUN: 13 mg/dL (ref 6–20)
CO2: 29 mmol/L (ref 22–32)
Calcium: 8.4 mg/dL — ABNORMAL LOW (ref 8.9–10.3)
Chloride: 105 mmol/L (ref 98–111)
Creatinine, Ser: 0.96 mg/dL (ref 0.44–1.00)
GFR, Estimated: >60 mL/min (ref >60)
Glucose, Bld: 95 mg/dL (ref 70–99)
Potassium: 4.3 mmol/L (ref 3.5–5.1)
Sodium: 137 mmol/L (ref 135–145)
Total Bilirubin: 0.2 mg/dL — ABNORMAL LOW (ref 0.3–1.2)
Total Protein: 7.3 g/dL (ref 6.5–8.1)

## 2020-11-06 LAB — I-STAT BETA HCG BLOOD, ED (MC, WL, AP ONLY): I-stat hCG, quantitative: <5 m[IU]/mL (ref <5)

## 2020-11-06 LAB — LIPASE, BLOOD: Lipase: 25 U/L (ref 11–51)

## 2020-11-06 MED ORDER — FLEET ENEMA 7-19 GM/118ML RE ENEM
1.0000 | ENEMA | Freq: Once | RECTAL | Status: AC
  Administered 2020-11-06: 1 via RECTAL
  Filled 2020-11-06: qty 1

## 2020-11-06 MED ORDER — DOCUSATE SODIUM 100 MG PO CAPS: 100.0000 mg | ORAL_CAPSULE | Freq: Two times a day (BID) | ORAL | 0 refills | Status: AC

## 2020-11-06 MED ORDER — ONDANSETRON HCL 4 MG/2ML IJ SOLN
4.0000 mg | Freq: Once | INTRAMUSCULAR | Status: AC
  Administered 2020-11-06: 4 mg via INTRAVENOUS
  Filled 2020-11-06: qty 2

## 2020-11-06 MED ORDER — LIDOCAINE VISCOUS HCL 2 % MT SOLN
15.0000 mL | Freq: Once | OROMUCOSAL | Status: DC
  Filled 2020-11-06: qty 15

## 2020-11-06 MED ORDER — ALUM & MAG HYDROXIDE-SIMETH 200-200-20 MG/5ML PO SUSP
30.0000 mL | Freq: Once | ORAL | Status: AC
  Administered 2020-11-06: 30 mL via ORAL
  Filled 2020-11-06: qty 30

## 2020-11-06 MED ORDER — ONDANSETRON 4 MG PO TBDP: 4.0000 mg | ORAL_TABLET | Freq: Three times a day (TID) | ORAL | 0 refills | Status: AC | PRN

## 2020-11-06 MED ORDER — LIDOCAINE VISCOUS HCL 2 % MT SOLN
15.0000 mL | Freq: Once | OROMUCOSAL | Status: AC
Start: 2020-11-06 — End: 2020-11-06
  Administered 2020-11-06: 15 mL via ORAL
  Filled 2020-11-06: qty 15

## 2020-11-06 MED ORDER — ALUM & MAG HYDROXIDE-SIMETH 200-200-20 MG/5ML PO SUSP
30.0000 mL | Freq: Once | ORAL | Status: DC
  Filled 2020-11-06: qty 30

## 2020-11-06 MED ORDER — MORPHINE SULFATE (PF) 4 MG/ML IV SOLN
4.0000 mg | Freq: Once | INTRAVENOUS | Status: AC
  Administered 2020-11-06: 4 mg via INTRAVENOUS
  Filled 2020-11-06: qty 1

## 2020-11-06 MED ORDER — POLYETHYLENE GLYCOL 3350 17 GM/SCOOP PO POWD: 1.0000 | Freq: Once | ORAL | 0 refills | Status: AC

## 2020-11-06 MED ORDER — SODIUM CHLORIDE 0.9 % IV BOLUS
500.0000 mL | Freq: Once | INTRAVENOUS | Status: AC
  Administered 2020-11-06: 500 mL via INTRAVENOUS

## 2020-11-06 NOTE — ED Notes (Signed)
Patient states when enema was completed, a small amount of hard stool was passed. Patient given prune juice and educated on which foods will help keep her bowels moving at home

## 2020-11-06 NOTE — ED Triage Notes (Signed)
Pt complains of abdominal pain with constipation and indigestion x 2 days.

## 2020-11-06 NOTE — ED Provider Notes (Signed)
[facility]-EMERGENCY DEPT Provider Note   CSN: [id number] Arrival date & time: 11/06/20  [id number]     History Chief Complaint  Patient presents with  . Gastroesophageal Reflux  . Constipation  . Abdominal Pain    Virginia Simmons is a 45 y.o. female.   Gastroesophageal Reflux Associated symptoms include abdominal pain. Pertinent negatives include no chest pain and no shortness of breath.  Constipation Associated symptoms: abdominal pain   Associated symptoms: no dysuria, no fever and no vomiting   Abdominal Pain Associated symptoms: constipation   Associated symptoms: no chest pain, no cough, no dysuria, no fever, no shortness of breath, no vaginal bleeding and no vomiting    Patient presents with 4 days of epigastric abdominal pain.  Started gradually when she was at the beach after having multiple cocktails for her cousin's birthday, the pain is been constant since then.  It does not radiate elsewhere, it is primarily in the epigastric periumbilical area.  She has tried Tylenol without relief, she also has taken Protonix for the last 2 days that she has history of GERD and was without her medicine while at the beach.  There is associated nausea, no vomiting.  There are no urinary complaints or any vaginal complaints.  She has history of tubal ligation, denies any other abdominal surgeries.  Patient also reports associated constipation for the last 4 days.  She has passed gas, she passes small amounts of stool.  She also has a few episodes of diarrhea earlier last week.  No bright red blood per rectum.  Past Medical History:  Diagnosis Date  . Arthritis   . Depression   . Diabetes mellitus   . Fibromyalgia lupus  . Hypertension   . Meningitis     Patient Active Problem List   Diagnosis Date Noted  . Fever 05/07/2014  . Headache 05/07/2014  . Cephalalgia   . Other specified fever   . 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 History of diabetes mellitus  08/17/2012  . Weight loss 08/17/2012  . Seasonal allergies 08/17/2012  . History of MRSA infection 08/17/2012  . Cigarette smoker 08/17/2012  . Bipolar I disorder, most recent episode (or current) depressed, unspecified 11/14/2011  . HTN (hypertension) 11/09/2011  . Anxiety and depression 05/01/2011    Class: Chronic    Past Surgical History:  Procedure Laterality Date  . CESAREAN SECTION  08/30/2011   Procedure: CESAREAN SECTION;  Surgeon: Bing Plumehomas F Henley, MD;  Location: [facility];  Service: Gynecology;  Laterality: N/A;  . CESAREAN SECTION    . FOOT FRACTURE SURGERY    . FOOT SURGERY     left   . HAND SURGERY    . HAND SURGERY     left  . LAPAROSCOPIC TUBAL LIGATION N/A 05/28/2012   Procedure: LAPAROSCOPIC TUBAL LIGATION;  Surgeon: Bing Plumehomas F Henley, MD;  Location: [facility];  Service: Gynecology;  Laterality: N/A;  laparoscopic tubal cauterization     OB History     Gravida  2   Para  1   Term  1   Preterm  0   AB  0   Living  1      SAB  0   IAB  0   Ectopic  0   Multiple  0   Live Births  1           Family History  Problem Relation Age of Onset  . Hypertension Father   . Diabetes Father   .  Drug abuse Father   . Bipolar disorder Sister     Social History   Tobacco Use  . Smoking status: Every Day    Packs/day: 0.25    Years: 10.00    Pack years: 2.50    Types: Cigarettes  . Smokeless tobacco: Never  Substance Use Topics  . Alcohol use: No  . Drug use: No    Home Medications Prior to Admission medications   Medication Sig Start Date End Date Taking? Authorizing Provider  ACCU-CHEK GUIDE test strip E1 1.65, FOR CAPILLARY BLOOD GLUCOSE TESTING MORNING AND TWO HOURS AFTER SUPPER. 03/01/20   [provider]  albuterol (PROAIR HFA) 108 (90 Base) MCG/ACT inhaler ProAir HFA 90 mcg/actuation aerosol inhaler  INHALE 2 PUFFS 4 TIMES A DAY AS NEEDED 06/11/19   [provider]  ALPRAZolam Prudy Feeler) 1 MG tablet Take 1 mg by mouth at  bedtime as needed for sleep (sleep).     [provider]  atenolol-chlorthalidone (TENORETIC) 50-25 MG per tablet Take 1 tablet by mouth daily.    [provider]  atorvastatin (LIPITOR) 10 MG tablet Take 1 tablet by mouth at bedtime. 06/11/19   [provider]  cyclobenzaprine (FLEXERIL) 10 MG tablet Take 1 tablet (10 mg total) by mouth 2 (two) times daily as needed for muscle spasms. 07/10/20   Tilden Fossa, MD  ergocalciferol (VITAMIN D2) 1.25 MG (50000 UT) capsule ergocalciferol (vitamin D2) 1,250 mcg (50,000 unit) capsule    [provider]  fexofenadine (ALLEGRA) 180 MG tablet Take 1 tablet (180 mg total) by mouth daily. 10/28/11 10/27/12  Linna Hoff, MD  fluticasone (FLONASE) 50 MCG/ACT nasal spray Place 1 spray into the nose 2 (two) times daily. 10/28/11 10/27/12  Linna Hoff, MD  folic acid (FOLVITE) 1 MG tablet folic acid 1 mg tablet    [provider]  haloperidol (HALDOL) 5 MG tablet haloperidol 5 mg tablet  TAKE 1 TABLET (5 MG TOTAL) BY MOUTH AT BEDTIME.    [provider]  hydrocortisone 2.5 % cream hydrocortisone 2.5 % topical cream    [provider]  hydrOXYzine (ATARAX/VISTARIL) 25 MG tablet hydroxyzine HCl 25 mg tablet  TAKE 1 CAPSULE (25 MG TOTAL) BY MOUTH 2 (TWO) TIMES DAILY.    [provider]  ibuprofen (ADVIL,MOTRIN) 600 MG tablet TAKE 1 TABLET (600 MG TOTAL) BY MOUTH EVERY 6 (SIX) HOURS AS NEEDED FOR PAIN. 05/20/14   Poe, Deirdre C, CNM  ibuprofen (ADVIL,MOTRIN) 800 MG tablet Take 1 tablet (800 mg total) by mouth 3 (three) times daily. Patient not taking: No sig reported 04/01/12   Linna Hoff, MD  levofloxacin (LEVAQUIN) 500 MG tablet Take 1 tablet (500 mg total) by mouth daily. 05/08/14   Rhetta Mura, MD  metaxalone (SKELAXIN) 800 MG tablet Take 1 tablet (800 mg total) by mouth 3 (three) times daily. 07/15/16   Isa Rankin, MD  oxyCODONE-acetaminophen (PERCOCET/ROXICET) 5-325 MG per  tablet Take 1-2 tablets by mouth every 4 (four) hours as needed for severe pain. 05/05/14   Lorre Nick, MD  QUEtiapine (SEROQUEL) 100 MG tablet quetiapine 100 mg tablet  TAKE 1 TABLET (100 MG TOTAL) BY MOUTH AT BEDTIME.    [provider]  sodium chloride (OCEAN) 0.65 % SOLN nasal spray Place 1 spray into the nose as needed for congestion. Patient not taking: No sig reported 12/01/12   Cathlyn Parsons, NP  traMADol (ULTRAM) 50 MG tablet Take 1 tablet (50 mg total) by mouth  every 6 (six) hours as needed. Patient not taking: No sig reported 07/05/13   Rodolph Bong, MD  valACYclovir (VALTREX) 1000 MG tablet Take 1 tablet (1,000 mg total) by mouth 3 (three) times daily. Patient not taking: No sig reported 07/05/13   Rodolph Bong, MD  valACYclovir (VALTREX) 1000 MG tablet Take 1 tablet (1,000 mg total) by mouth 3 (three) times daily. 06/19/15   Tharon Aquas, PA    Allergies    Influenza vaccines  Review of Systems   Review of Systems  Constitutional:  Negative for fever.  HENT:  Negative for congestion.   Respiratory:  Negative for cough and shortness of breath.   Cardiovascular:  Negative for chest pain.  Gastrointestinal:  Positive for abdominal pain and constipation. Negative for vomiting.  Genitourinary:  Negative for dysuria, flank pain and vaginal bleeding.  Musculoskeletal:  Negative for myalgias.  Skin:  Negative for color change.   Physical Exam Updated Vital Signs BP (!) 159/102 (BP Location: Right Arm)   Pulse (!) 108   Temp 98.5 F (36.9 C) (Oral)   Resp 18   Ht 5\' 4"  (1.626 m)   Wt 122.5 kg   SpO2 100%   BMI 46.35 kg/m   Physical Exam Vitals and nursing note reviewed. Exam conducted with a chaperone present.  Constitutional:      Appearance: Normal appearance. She is obese.     Comments: Uncomfortable appearing, laying on her left side.  HENT:     Head: Normocephalic and atraumatic.  Eyes:     General: No scleral icterus.       Right eye: No  discharge.        Left eye: No discharge.     Extraocular Movements: Extraocular movements intact.     Pupils: Pupils are equal, round, and reactive to light.  Cardiovascular:     Rate and Rhythm: Normal rate and regular rhythm.     Pulses: Normal pulses.     Heart sounds: Normal heart sounds. No murmur heard.   No friction rub. No gallop.  Pulmonary:     Effort: Pulmonary effort is normal. No respiratory distress.     Breath sounds: Normal breath sounds.  Abdominal:     General: Abdomen is flat. Bowel sounds are increased. There is no distension.     Palpations: Abdomen is soft.     Tenderness: There is abdominal tenderness in the epigastric area and periumbilical area. There is no guarding or rebound.     Comments: Abdomen is soft, tender to palpation periumbilically and epigastrically.  No CVA tenderness, no rigidity or guarding.  Skin:    General: Skin is warm and dry.     Coloration: Skin is not jaundiced.  Neurological:     Mental Status: She is alert. Mental status is at baseline.     Coordination: Coordination normal.    ED Results / Procedures / Treatments   Labs (all labs ordered are listed, but only abnormal results are displayed) Labs Reviewed - No data to display  EKG None  Radiology No results found.  Procedures Procedures   Medications Ordered in ED Medications - No data to display  ED Course  I have reviewed the triage vital signs and the nursing notes.  Pertinent labs & imaging results that were available during my care of the patient were reviewed by me and considered in my medical decision making (see chart for details).  Clinical Course as of 11/06/20 1357  Sun Nov 06, 2020  1108 DG Abd 2 Views [HS]  1356 Comprehensive metabolic panel(!) No electrolyte derangement, no AKI, no LFT elevations [HS]  1356 CBC with Differential No leukocytosis, no anemia  [HS]  1357 Urinalysis, Routine w reflex microscopic(!) No UTI [HS]  1357 Lipase, blood Not  consistent with pancreatitis. [HS]  1357 I-Stat Beta hCG blood, ED ([facility], [facility], [facility] only) No signs of ectopic pregnancy [HS]    Clinical Course User Index [HS] Theron Arista, PA-C   MDM Rules/Calculators/A&P                           Patient was tachycardic on intake, that is resolved while in the room.  Her vitals are stable although she is mildly hypertensive.  She has nontoxic-appearing, although she does appear uncomfortable.  Physical exam is overall reassuring, she is tender in the periumbilical area and epigastric area but it is Without any guarding or rigidity.  Abdomen is soft, no peritoneal signs.  We will start with basic lab work to evaluate for signs of pancreatitis given her alcohol use over the weekend as well as for leukocytosis suggestive of intra-abdominal pathology.  We will also check urine for UTI.  Urine is negative for any red blood cells concerning for kidney stone or nitrite or leukocyte sites consistent with UTI.  No leukocytosis, no electrolyte derangement.  Overall is reassuring, I suspect that her pain is likely due to gastritis due to the alcohol use as well as medical complaints for her acid reflux.  We will give her GI cocktail this time.  We will also get radiograph to assess for signs of small bowel obstruction as well as to assess for degree of constipation.  Radiograph did not show any signs of obstruction.  Patient is not overly constipated, we did try an enema with some success.  We will give the patient a bowel prep and have her follow-up if unable to have a full bowel movement in the next 48 hours.  At this time I think she is more than appropriate for discharge with outpatient follow-up.  Vitals are stable, her pain is improved.  We will give her Zofran as needed for nausea, discussed with the patient that this can increase her constipation.  She is aware.  Final Clinical Impression(s) / ED Diagnoses Final diagnoses:  None    Rx / DC Orders ED Discharge Orders      None        Theron Arista, PA-C 11/06/20 1357    Sloan Leiter, DO 11/06/20 1902

## 2020-11-06 NOTE — Discharge Instructions (Addendum)
Take stool softener twice daily.  Please pick up the MiraLAX and take 2 spoonfuls tonight.  He will also try over-the-counter suppository laxatives such as bisacodyl as needed.  I also recommend Zofran as needed for nausea vomiting, this can increase her constipation so do not take until after you have had a bowel movement. If you are unsuccessful in having a bowel movement please back to the ED for fecal disimpaction.

## 2020-11-06 NOTE — ED Notes (Signed)
Patient ambulatory to restroom to provide urine sample.

## 2021-01-13 ENCOUNTER — Ambulatory Visit
Admission: RE | Admit: 2021-01-13 | Discharge: 2021-01-13 | Disposition: A | Discharge status: Home / Self Care | Payer: Medicaid Other | Source: Ambulatory Visit | Attending: Physician Assistant | Admitting: Physician Assistant

## 2021-01-13 ENCOUNTER — Other Ambulatory Visit: Payer: Self-pay | Admitting: Physician Assistant

## 2021-01-13 DIAGNOSIS — M25562 Pain in left knee: Secondary | ICD-10-CM

## 2021-01-13 DIAGNOSIS — M25571 Pain in right ankle and joints of right foot: Secondary | ICD-10-CM

## 2021-01-13 DIAGNOSIS — M25572 Pain in left ankle and joints of left foot: Secondary | ICD-10-CM

## 2021-01-13 DIAGNOSIS — M25561 Pain in right knee: Secondary | ICD-10-CM

## 2021-01-24 ENCOUNTER — Ambulatory Visit: Payer: Medicaid Other

## 2021-02-08 ENCOUNTER — Other Ambulatory Visit: Payer: Self-pay

## 2021-02-08 ENCOUNTER — Ambulatory Visit: Payer: Medicaid Other | Attending: Physician Assistant

## 2021-02-08 DIAGNOSIS — G8929 Other chronic pain: Secondary | ICD-10-CM | POA: Diagnosis present

## 2021-02-08 DIAGNOSIS — M545 Low back pain, unspecified: Secondary | ICD-10-CM | POA: Insufficient documentation

## 2021-02-08 DIAGNOSIS — M6281 Muscle weakness (generalized): Secondary | ICD-10-CM | POA: Diagnosis present

## 2021-02-08 NOTE — Therapy (Addendum)
[facility] Outpatient Rehabilitation [facility] [address] [place], [place], [address] Phone: [phone]   Fax:  [phone]  Physical Therapy Evaluation  Patient Details  Name: Virginia Simmons MRN: [id number] Date of Birth: Sep 10, 1975 Referring Provider (PT): Marvia Pickles, [place]   Encounter Date: 02/08/2021   PT End of Session - 02/08/21 1057     Visit Number 1    Number of Visits 13    Date for PT Re-Evaluation 03/25/21    Authorization Type [employer] MCD    Authorization - Number of Visits 27    PT Start Time 1015    PT Stop Time 1100    PT Time Calculation (min) 45 min    Activity Tolerance Patient tolerated treatment well    Behavior During Therapy [facility] for tasks assessed/performed             Past Medical History:  Diagnosis Date   Arthritis    Depression    Diabetes mellitus    Fibromyalgia lupus   Hypertension    Meningitis     Past Surgical History:  Procedure Laterality Date   CESAREAN SECTION  08/30/2011   Procedure: CESAREAN SECTION;  Surgeon: Bing Plume, MD;  Location: [facility];  Service: Gynecology;  Laterality: N/A;   CESAREAN SECTION     FOOT FRACTURE SURGERY     FOOT SURGERY     left    HAND SURGERY     HAND SURGERY     left   LAPAROSCOPIC TUBAL LIGATION N/A 05/28/2012   Procedure: LAPAROSCOPIC TUBAL LIGATION;  Surgeon: Bing Plume, MD;  Location: [facility];  Service: Gynecology;  Laterality: N/A;  laparoscopic tubal cauterization    There were no vitals filed for this visit.    Subjective Assessment - 02/08/21 1028     Subjective Pt says that her low back hurts with no MOI. She says her back has hurt for several years and "gets worse on rainy days and when I'm not working". Pt is a suis Investment banker, operational at [employer] and has been out of work recently. Pt says she thinks her low back pain is due to her weight. Pt says she tries to keep moving moving despite her pain because "I know I need to move".  Pt says her pain gets the worse  when she "does too much and overdoes it". Pt says "sleeping is difficult" and she says she only lays on her Rt side. Pt says she sometimes gets N/T in her foot, but not currently. Denies bowel and bladder changes. Pt says pain is local to the lower back currently, but will sometimes go into the thigh.    Pertinent History diabetes, hypertension, depression    Limitations Standing;Walking;House hold activities    How long can you sit comfortably? "a couple minutes"    How long can you stand comfortably? unlimited    How long can you walk comfortably? unlimited    Diagnostic tests XRAY: Intervertebral disc spaces are maintained. Facet DJD L4-5, L5-S1.  Mild vertebral endplate spurring in the visualized lower thoracic  spine.  IMPRESSION:  1. Negative for fracture or other acute bone abnormality.  2. Spondylitic changes as above.    Patient Stated Goals "[name] do what you want to do" (pain reduction)    Currently in Pain? Yes    Pain Score 8     Pain Location Back    Pain Orientation Lower    Pain Descriptors / Indicators Aching  Pain Type Chronic pain    Pain Onset More than a month ago    Pain Frequency Constant    Aggravating Factors  rainy days    Pain Relieving Factors sitting    Effect of Pain on Daily Activities does not effect ADLs            [facility] Adult PT Treatment/Exercise:  Therapeutic Exercise: - instructed on initial HEP   Manual Therapy: - NA  Neuromuscular re-ed: - NA  Therapeutic Activity: - Discussed assessment findings, pathophysiology of condition, prognosis, and expected progression.   Self-care/Home Management: - Pt instructed on activity for improving low back pain and activity tolerance.      [facility] PT Assessment - 02/09/21 0001       Assessment   Medical Diagnosis Back pain    Referring Provider (PT) Marvia Pickles, PA-C    Onset Date/Surgical Date --   years ago   Hand Dominance Right    Next MD Visit 02/22/21    Prior Therapy PT on shoulder       Precautions   Precautions None      Restrictions   Weight Bearing Restrictions No      Home Environment   Living Environment Private residence    Living Arrangements Children   daughter   Type of Home House    Home Layout Two level      Prior Function   Level of Independence Independent      Cognition   Overall Cognitive Status Within Functional Limits for tasks assessed      Observation/Other Assessments   Observations significant anterior pelvic tilt in standing and seated      Coordination   Gross Motor Movements are Fluid and Coordinated Yes      Deep Tendon Reflexes   Patella DTR 0   bilat   Achilles DTR 1+   bilat     AROM   Lumbar Flexion fingertips to floor, minimal flexion from lumbar spine (majority in hip); painful    Lumbar Extension Moderately restricted; significant increase in pain    Lumbar - Right Side Bend finger tips to lateral epicondle of knee; pain    Lumbar - Left Side Bend finger tips to lateral epicondle of knee; pain    Lumbar - Right Rotation minimally restricted; no increased pain    Lumbar - Left Rotation minimally restricted, no pain      Strength   Right Hip Flexion 4-/5    Right Hip ABduction 4-/5    Left Hip Flexion 4-/5    Left Hip ABduction 4-/5    Right Knee Flexion 4/5    Right Knee Extension 4/5    Left Knee Flexion 4/5    Left Knee Extension 4/5    Right Ankle Dorsiflexion 4+/5    Left Ankle Dorsiflexion 4+/5      Flexibility   Hamstrings WFL; no pain      Palpation   Spinal mobility painful to touch L1-L5 spinous process, greatest L4-L5    Palpation comment TTP along lumbar and thoracic paraspinals with light tight; significant pain in hip and upper glutes bilat      Special Tests   Other special tests (-) straight leg raise bilat; (+) slump bilat      Transfers   Comments Bed mobility: pt unable to tolerate prone without significant LBP; difficulty and increased time with sit<>supine<>sidelying, Unable to tolerate  sidelying on Lt      Ambulation/Gait   Gait Comments  outoeing, genu valgum, decrease pushoff, minimal trunk rotation or movement throughout, decreased hip extension.                         Objective measurements completed on examination: See above findings.                  PT Short Term Goals - 02/08/21 1740       PT SHORT TERM GOAL #1   Title Pt will be indep with HEP to improve pain management of condition.    Baseline issued at eval    Status New    Target Date 03/01/21      PT SHORT TERM GOAL #2   Title Pt will be able to tolerate sidelying on Lt with minimal pain to improve bed mobility and sleep.    Baseline unable    Status New    Target Date 03/01/21      PT SHORT TERM GOAL #3   Title Pt will be able to demonstrate correction of anterior pelvic tilt in standing to indicate improved pelvic control for optimize standing and walking function.    Baseline not assessed    Status New    Target Date 03/01/21               PT Long Term Goals - 02/08/21 1747       PT LONG TERM GOAL #1   Title Pt will be able to achieve 5/5 strength on bilat hip MMTs to improve lumbopelvic stability during gait and occupational tasks.    Baseline see flowsheet    Status New    Target Date 03/22/21      PT LONG TERM GOAL #2   Title Pt will be able to report reduction in low back pain to </= 3/10 at rest to indicate improved pain management and activity tolerance.    Baseline see subjective report    Status New    Target Date 03/22/21      PT LONG TERM GOAL #3   Title Pt will be able to demonstrate proper gait with improve reciprical trunk rotation and decreased knee valgus to optimize walking function and activity tolerance in low back.    Baseline see flowsheet    Status New    Target Date 03/22/21                    Plan - 02/08/21 1801     Clinical Impression Statement Pt is a 45 y.o female that presents to [facility] with c/c of  bilateral low back pain. Pt has had imaging that shows mild spondylitic changes and reports relief in low back following supine single knee to chest. Able to provoke symtpoms into posterior LE bilat with slump, but patient reports intermittent radicular symtpoms during daily life. Pt presents with abnormal gait mechanics, bilat hip and knee strength deficits, and limited lumbar AROM due to pain. Pt was highly irritable to light touch around bilat glutes and lower lumber spine. These impairments are limiting the patients ability to walk, sleep, and work for long periods of time without pain. Pt will benefit from skilled physical therapy to address the deficits above to improve function and optimize quality of life.    Personal Factors and Comorbidities Comorbidity 3+;Behavior Pattern;Past/Current Experience;Fitness;Time since onset of injury/illness/exacerbation    Comorbidities diabetes, hypertension, depression, arthritis    Examination-Activity Limitations Bend;Lift;Bed Mobility;Stairs;Stand;Squat;Sleep    Examination-Participation Restrictions Occupation;Community Activity  Stability/Clinical Decision Making Stable/Uncomplicated    Clinical Decision Making Low    Rehab Potential Good    PT Frequency --   1-2 a week   PT Duration 6 weeks    PT Treatment/Interventions ADLs/Self Care Home Management;Aquatic Therapy;Cryotherapy;Electrical Stimulation;Moist Heat;Traction;Gait training;Stair training;Functional mobility training;Therapeutic activities;Therapeutic exercise;Balance training;Neuromuscular re-education;Patient/family education;Manual techniques;Taping;Passive range of motion;Spinal Manipulations;Dry needling    PT Next Visit Plan Assess  squating, lunge, stairs and other activity limitations; Assess response to repeated motions and movement derrangement; modalities for pain reduction; gentle AROM of lumbar spine for improve activity tolerance    PT Home Exercise Plan [email or web address]    Consulted  and Agree with Plan of Care Patient             Patient will benefit from skilled therapeutic intervention in order to improve the following deficits and impairments:  Abnormal gait, Improper body mechanics, Impaired perceived functional ability, Decreased activity tolerance, Decreased strength, Pain  Visit Diagnosis: Chronic bilateral low back pain, unspecified whether sciatica present  Muscle weakness (generalized)     Problem List Patient Active Problem List   Diagnosis Date Noted   Fever 05/07/2014   Headache 05/07/2014   Cephalalgia    Other specified fever    Meningitis due to bacteria 08/17/2012   History of diabetes mellitus 08/17/2012   Weight loss 08/17/2012   Seasonal allergies 08/17/2012   History of MRSA infection 08/17/2012   Cigarette smoker 08/17/2012   Bipolar I disorder, most recent episode (or current) depressed, unspecified 11/14/2011   HTN (hypertension) 11/09/2011   Anxiety and depression 05/01/2011    Class: Chronic   Velna Ochs, SPT 02/09/21 8:19 AM   [facility] Outpatient Rehabilitation [facility] [address] [place], [place], [address] Phone: [phone]   Fax:  [phone]  Name: Virginia Simmons MRN: [id number] Date of Birth: 11-23-1975

## 2021-02-15 ENCOUNTER — Ambulatory Visit: Payer: Medicaid Other

## 2021-02-15 DIAGNOSIS — G8929 Other chronic pain: Secondary | ICD-10-CM

## 2021-02-15 DIAGNOSIS — M6281 Muscle weakness (generalized): Secondary | ICD-10-CM

## 2021-02-15 DIAGNOSIS — M545 Low back pain, unspecified: Secondary | ICD-10-CM | POA: Diagnosis not present

## 2021-02-15 NOTE — Patient Instructions (Signed)
Access Code: [email or web address] URL: [email or web address] Date: 02/15/2021 Prepared by: Gustavus Bryant  Exercises Supine Single Knee to Chest Stretch - 2 x daily - 7 x weekly - 3 sets - 30 sec hold Clamshell - 2 x daily - 7 x weekly - 2 sets - 15 reps Sidelying Hip Abduction - 2 x daily - 7 x weekly - 2 sets - 15 reps Supine March - 2 x daily - 7 x weekly - 2 sets - 15 reps

## 2021-02-15 NOTE — Therapy (Signed)
[facility] Outpatient Rehabilitation [facility] [address] [place], [place], [address] Phone: [phone]   Fax:  [phone]  Physical Therapy Treatment  Patient Details  Name: Virginia Simmons MRN: [id number] Date of Birth: Nov 09, 1975 Referring Provider (PT): Marvia Pickles, [place]   Encounter Date: 02/15/2021   PT End of Session - 02/15/21 1057     Visit Number 2    Number of Visits 13    Date for PT Re-Evaluation 03/25/21    Authorization Type [employer]HC MCD    Authorization - Number of Visits 27    PT Start Time 1005    PT Stop Time 1050    PT Time Calculation (min) 45 min    Activity Tolerance Patient tolerated treatment well    Behavior During Therapy [facility] for tasks assessed/performed             Past Medical History:  Diagnosis Date   Arthritis    Depression    Diabetes mellitus    Fibromyalgia lupus   Hypertension    Meningitis     Past Surgical History:  Procedure Laterality Date   CESAREAN SECTION  08/30/2011   Procedure: CESAREAN SECTION;  Surgeon: Bing Plume, MD;  Location: [facility];  Service: Gynecology;  Laterality: N/A;   CESAREAN SECTION     FOOT FRACTURE SURGERY     FOOT SURGERY     left    HAND SURGERY     HAND SURGERY     left   LAPAROSCOPIC TUBAL LIGATION N/A 05/28/2012   Procedure: LAPAROSCOPIC TUBAL LIGATION;  Surgeon: Bing Plume, MD;  Location: [facility];  Service: Gynecology;  Laterality: N/A;  laparoscopic tubal cauterization    There were no vitals filed for this visit.   Subjective Assessment - 02/15/21 1056     Subjective Feels moving around last session offered her some relief and it felt good to stretch out.  No increased soreness to note.    Pertinent History diabetes, hypertension, depression    Limitations Standing;Walking;House hold activities    How long can you sit comfortably? "a couple minutes"    How long can you stand comfortably? unlimited    How long can you walk comfortably? unlimited    Diagnostic  tests XRAY: Intervertebral disc spaces are maintained. Facet DJD L4-5, L5-S1.  Mild vertebral endplate spurring in the visualized lower thoracic  spine.  IMPRESSION:  1. Negative for fracture or other acute bone abnormality.  2. Spondylitic changes as above.    Patient Stated Goals "[name] do what you want to do" (pain reduction)    Pain Onset More than a month ago                               [facility] Adult PT Treatment/Exercise - 02/15/21 0001       Transfers   Number of Reps 10 reps    Comments 5x arms crossed 5x with OH reach.      Ambulation/Gait   Ambulation/Gait Yes    Ambulation/Gait Assistance 7: Independent    Ambulation Distance (Feet) 200 Feet    Gait Comments mild L trendelenburg      Lumbar Exercises: Stretches   Lower Trunk Rotation 2 reps;30 seconds    Lower Trunk Rotation Limitations with opposite cervical rotation      Lumbar Exercises: Seated   Other Seated Lumbar Exercises latisimus press with inspiration 10x    Other Seated Lumbar  Exercises core exercises of hip and shoulder tosses, chops and Vs with unweighted ball 10x each.      Lumbar Exercises: Supine   Other Supine Lumbar Exercises marching 15x per side alternating      Lumbar Exercises: Sidelying   Clam Both;15 reps    Clam Limitations 15x each    Hip Abduction Both;15 reps;Limitations    Hip Abduction Limitations slight hip extension to isolate glute medius      Knee/Hip Exercises: Standing   Other Standing Knee Exercises opposite shoulder/hip anbuction with single UE support on foam roll, VC for proper form      Knee/Hip Exercises: Seated   Long Arc Quad Strengthening;Both;1 set;15 reps;Limitations    Long Arc Quad Limitations alternating with latissimus press                       PT Short Term Goals - 02/15/21 1105       PT SHORT TERM GOAL #1   Title Pt will be indep with HEP to improve pain management of condition.    Baseline issued at eval; 02/15/21  [facility]    Status New    Target Date 03/01/21      PT SHORT TERM GOAL #2   Title Pt will be able to tolerate sidelying on Lt with minimal pain to improve bed mobility and sleep.    Baseline unable; 02/15/21 Able to tolerate L sidelie for eercises and treatment tasks    Status On-going    Target Date 03/01/21      PT SHORT TERM GOAL #3   Title Pt will be able to demonstrate correction of anterior pelvic tilt in standing to indicate improved pelvic control for optimize standing and walking function.    Baseline not assessed    Status New    Target Date 03/01/21               PT Long Term Goals - 02/08/21 1747       PT LONG TERM GOAL #1   Title Pt will be able to achieve 5/5 strength on bilat hip MMTs to improve lumbopelvic stability during gait and occupational tasks.    Baseline see flowsheet    Status New    Target Date 03/22/21      PT LONG TERM GOAL #2   Title Pt will be able to report reduction in low back pain to </= 3/10 at rest to indicate improved pain management and activity tolerance.    Baseline see subjective report    Status New    Target Date 03/22/21      PT LONG TERM GOAL #3   Title Pt will be able to demonstrate proper gait with improve reciprical trunk rotation and decreased knee valgus to optimize walking function and activity tolerance in low back.    Baseline see flowsheet    Status New    Target Date 03/22/21                   Plan - 02/15/21 1058     Clinical Impression Statement Reviewed HEP for compliance and accuracy.  Assessed stairclimbing  with patient adopting and step to pattern to ascend/desend with single rail assist to avoid knee pain, ascending leading with LLE  and descending leading with RLE to avoid B knee pain.  PAtient able to STS from mat height w/o need of UE support and able to perform OH reach to assess balance and overall flexility.  Advanced to core an abdominal strengthening as well as standing stability tasks  with UE/LE dissociation to challenge motor patterns with ciung needed for accuray and form.  Presents with tissue restrictions in lumbosacral regions and L hip abduction weakness vs R.    Personal Factors and Comorbidities Comorbidity 3+;Behavior Pattern;Past/Current Experience;Fitness;Time since onset of injury/illness/exacerbation    Comorbidities diabetes, hypertension, depression, arthritis    Examination-Activity Limitations Bend;Lift;Bed Mobility;Stairs;Stand;Squat;Sleep    Examination-Participation Restrictions Occupation;Community Activity    Stability/Clinical Decision Making Stable/Uncomplicated    Rehab Potential Good    PT Frequency --   1-2 a week   PT Duration 6 weeks    PT Treatment/Interventions ADLs/Self Care Home Management;Aquatic Therapy;Cryotherapy;Electrical Stimulation;Moist Heat;Traction;Gait training;Stair training;Functional mobility training;Therapeutic activities;Therapeutic exercise;Balance training;Neuromuscular re-education;Patient/family education;Manual techniques;Taping;Passive range of motion;Spinal Manipulations;Dry needling    PT Next Visit Plan Assess  squating, lunge, and other activity limitations; Assess response to repeated motions and movement derrangement; modalities for pain reduction; gentle AROM of lumbar spine for improve activity tolerance, f/u on new exercisesto HEP and continue core  and hip functional strengthening    PT Home Exercise Plan [email or web address]    Consulted and Agree with Plan of Care Patient             Patient will benefit from skilled therapeutic intervention in order to improve the following deficits and impairments:  Abnormal gait, Improper body mechanics, Impaired perceived functional ability, Decreased activity tolerance, Decreased strength, Pain  Visit Diagnosis: Chronic bilateral low back pain, unspecified whether sciatica present  Muscle weakness (generalized)     Problem List Patient Active Problem List   Diagnosis  Date Noted   Fever 05/07/2014   Headache 05/07/2014   Cephalalgia    Other specified fever    Meningitis due to bacteria 08/17/2012   History of diabetes mellitus 08/17/2012   Weight loss 08/17/2012   Seasonal allergies 08/17/2012   History of MRSA infection 08/17/2012   Cigarette smoker 08/17/2012   Bipolar I disorder, most recent episode (or current) depressed, unspecified 11/14/2011   HTN (hypertension) 11/09/2011   Anxiety and depression 05/01/2011    Class: Chronic    Hildred Laser, PT 02/15/2021, 11:17 AM  [facility] [address] [place], [place], [address] Phone: [phone]   Fax:  [phone]  Name: Virginia Simmons MRN: [id number] Date of Birth: Jun 12, 1975

## 2021-02-21 ENCOUNTER — Ambulatory Visit: Payer: Medicaid Other

## 2021-02-21 ENCOUNTER — Other Ambulatory Visit: Payer: Self-pay

## 2021-02-21 DIAGNOSIS — M545 Low back pain, unspecified: Secondary | ICD-10-CM

## 2021-02-21 DIAGNOSIS — M6281 Muscle weakness (generalized): Secondary | ICD-10-CM

## 2021-02-21 NOTE — Therapy (Signed)
[facility] Outpatient Rehabilitation [facility] [address] [place], [place], [address] Phone: [phone]   Fax:  [phone]  Physical Therapy Treatment  Patient Details  Name: Virginia Simmons MRN: [id number] Date of Birth: 1976/03/01 Referring Provider (PT): Marvia Pickles, [place]   Encounter Date: 02/21/2021   PT End of Session - 02/21/21 1015     Visit Number 3    Number of Visits 13    Date for PT Re-Evaluation 03/25/21    Authorization Type [employer] MCD    Authorization - Visit Number 2    Authorization - Number of Visits 27    PT Start Time 1015    PT Stop Time 1055    PT Time Calculation (min) 40 min    Activity Tolerance Patient tolerated treatment well    Behavior During Therapy [facility] for tasks assessed/performed             Past Medical History:  Diagnosis Date   Arthritis    Depression    Diabetes mellitus    Fibromyalgia lupus   Hypertension    Meningitis     Past Surgical History:  Procedure Laterality Date   CESAREAN SECTION  08/30/2011   Procedure: CESAREAN SECTION;  Surgeon: Bing Plume, MD;  Location: [facility];  Service: Gynecology;  Laterality: N/A;   CESAREAN SECTION     FOOT FRACTURE SURGERY     FOOT SURGERY     left    HAND SURGERY     HAND SURGERY     left   LAPAROSCOPIC TUBAL LIGATION N/A 05/28/2012   Procedure: LAPAROSCOPIC TUBAL LIGATION;  Surgeon: Bing Plume, MD;  Location: [facility];  Service: Gynecology;  Laterality: N/A;  laparoscopic tubal cauterization    There were no vitals filed for this visit.   Subjective Assessment - 02/21/21 1019     Subjective Patient reports her pain is not too bad right now, about a 6.    Currently in Pain? Yes    Pain Score 6     Pain Location Back    Pain Orientation Lower    Pain Descriptors / Indicators Dull    Pain Type Chronic pain    Pain Onset More than a month ago    Pain Frequency Intermittent    Aggravating Factors  unknown    Pain Relieving Factors heat                   TREATMENT 02/21/2021:  Therapeutic Exercise: - posterior pelvic tilt 2 x 10 heavy cues - supine march 2 x 10 heavy cues  - hip bridge 2 x 10 heavy cues  - seated pelvic tilts 2 x 10 heavy cues  - sidelying hip abduction 2 x 10 bilateral  - figure 4 stretch 60 sec each  - sit to stand 1 x 10  Manual Therapy: - n/a  Neuromuscular re-ed: - n/a  Therapeutic Activity: - n/a  Self-care/Home Management: - n/a                         PT Short Term Goals - 02/21/21 1024       PT SHORT TERM GOAL #1   Title Pt will be indep with HEP to improve pain management of condition.    Baseline 12/20 requires cues    Status On-going    Target Date 03/01/21      PT SHORT TERM GOAL #2   Title Pt will be  able to tolerate sidelying on Lt with minimal pain to improve bed mobility and sleep.    Baseline unable; 02/15/21 Able to tolerate L sidelie for eercises and treatment tasks    Status On-going    Target Date 03/01/21      PT SHORT TERM GOAL #3   Title Pt will be able to demonstrate correction of anterior pelvic tilt in standing to indicate improved pelvic control for optimize standing and walking function.    Baseline not assessed    Status Deferred    Target Date 03/01/21               PT Long Term Goals - 02/08/21 1747       PT LONG TERM GOAL #1   Title Pt will be able to achieve 5/5 strength on bilat hip MMTs to improve lumbopelvic stability during gait and occupational tasks.    Baseline see flowsheet    Status New    Target Date 03/22/21      PT LONG TERM GOAL #2   Title Pt will be able to report reduction in low back pain to </= 3/10 at rest to indicate improved pain management and activity tolerance.    Baseline see subjective report    Status New    Target Date 03/22/21      PT LONG TERM GOAL #3   Title Pt will be able to demonstrate proper gait with improve reciprical trunk rotation and decreased knee valgus to optimize walking  function and activity tolerance in low back.    Baseline see flowsheet    Status New    Target Date 03/22/21                   Plan - 02/21/21 1016     Clinical Impression Statement Continued with progressing core and hip strengthening today with patient having extreme difficulty achieving and maintaining proper TA activation with all strengthening even with heavy verbal, tactile, and visual cues. She is intermittently able to achieve neutral spine with supine strengthening, though is only able to maintain for short duration. Cues for pacing required throughout session.    PT Treatment/Interventions ADLs/Self Care Home Management;Aquatic Therapy;Cryotherapy;Electrical Stimulation;Moist Heat;Traction;Gait training;Stair training;Functional mobility training;Therapeutic activities;Therapeutic exercise;Balance training;Neuromuscular re-education;Patient/family education;Manual techniques;Taping;Passive range of motion;Spinal Manipulations;Dry needling    PT Next Visit Plan continue core and hip strengthening.    PT Home Exercise Plan [email or web address]    Consulted and Agree with Plan of Care Patient             Patient will benefit from skilled therapeutic intervention in order to improve the following deficits and impairments:  Abnormal gait, Improper body mechanics, Impaired perceived functional ability, Decreased activity tolerance, Decreased strength, Pain  Visit Diagnosis: Chronic bilateral low back pain, unspecified whether sciatica present  Muscle weakness (generalized)     Problem List Patient Active Problem List   Diagnosis Date Noted   Fever 05/07/2014   Headache 05/07/2014   Cephalalgia    Other specified fever    Meningitis due to bacteria 08/17/2012   History of diabetes mellitus 08/17/2012   Weight loss 08/17/2012   Seasonal allergies 08/17/2012   History of MRSA infection 08/17/2012   Cigarette smoker 08/17/2012   Bipolar I disorder, most recent episode (or  current) depressed, unspecified 11/14/2011   HTN (hypertension) 11/09/2011   Anxiety and depression 05/01/2011    Class: Chronic   Letitia Libra, PT, DPT, ATC 02/21/21 10:57 AM  [facility] [address] [place], [place], [address] Phone: [phone]   Fax:  [phone]  Name: Virginia Simmons MRN: [id number] Date of Birth: [birth year]

## 2021-02-22 ENCOUNTER — Ambulatory Visit: Payer: Medicaid Other

## 2021-02-22 DIAGNOSIS — M6281 Muscle weakness (generalized): Secondary | ICD-10-CM

## 2021-02-22 DIAGNOSIS — M545 Low back pain, unspecified: Secondary | ICD-10-CM | POA: Diagnosis not present

## 2021-02-22 NOTE — Patient Instructions (Signed)
Access Code: [email or web address] URL: [email or web address] Date: 02/22/2021 Prepared by: Gustavus Bryant  Exercises Clamshell - 2 x daily - 7 x weekly - 2 sets - 15 reps Sidelying Hip Abduction - 2 x daily - 7 x weekly - 2 sets - 15 reps Supine March - 2 x daily - 7 x weekly - 2 sets - 15 reps Supine Piriformis Stretch with Foot on Ground - 2 x daily - 7 x weekly - 1 sets - 3 reps - 30s hold

## 2021-02-22 NOTE — Therapy (Signed)
[facility] Outpatient Rehabilitation [facility] [address] [place], [place], [address] Phone: [phone]   Fax:  [phone]  Physical Therapy Treatment  Patient Details  Name: Virginia Simmons MRN: [id number] Date of Birth: 04/04/1975 Referring Provider (PT): Virginia Simmons, [place]   Encounter Date: 02/22/2021   PT End of Session - 02/22/21 1313     Visit Number 4    Number of Visits 13    Date for PT Re-Evaluation 03/25/21    Authorization Type UHC MCD    Authorization - Visit Number 2    Authorization - Number of Visits 27    PT Start Time 1315    PT Stop Time 1400    PT Time Calculation (min) 45 min    Activity Tolerance Patient tolerated treatment well    Behavior During Therapy [facility] for tasks assessed/performed             Past Medical History:  Diagnosis Date   Arthritis    Depression    Diabetes mellitus    Fibromyalgia lupus   Hypertension    Meningitis     Past Surgical History:  Procedure Laterality Date   CESAREAN SECTION  08/30/2011   Procedure: CESAREAN SECTION;  Surgeon: Bing Plume, MD;  Location: [facility];  Service: Gynecology;  Laterality: N/A;   CESAREAN SECTION     FOOT FRACTURE SURGERY     FOOT SURGERY     left    HAND SURGERY     HAND SURGERY     left   LAPAROSCOPIC TUBAL LIGATION N/A 05/28/2012   Procedure: LAPAROSCOPIC TUBAL LIGATION;  Surgeon: Bing Plume, MD;  Location: [facility];  Service: Gynecology;  Laterality: N/A;  laparoscopic tubal cauterization    There were no vitals filed for this visit.   Subjective Assessment - 02/22/21 1316     Subjective Rates pain at 6-7/10,    Pertinent History diabetes, hypertension, depression    How long can you sit comfortably? "a couple minutes"    How long can you stand comfortably? unlimited    How long can you walk comfortably? unlimited    Diagnostic tests XRAY: Intervertebral disc spaces are maintained. Facet DJD L4-5, L5-S1.  Mild vertebral endplate spurring in the  visualized lower thoracic  spine.  IMPRESSION:  1. Negative for fracture or other acute bone abnormality.  2. Spondylitic changes as above.    Patient Stated Goals "Virginia Oman do what you want to do" (pain reduction)    Pain Onset More than a month ago             TREATMENT 02/22/2021:   Therapeutic Exercise: - posterior pelvic tilt 2 x 10 tactile cues - supine march 2 x 15 - hip bridge 2 x 15  - seated pelvic tilts 2 x 10 heavy cues  - sidelying hip abduction 2 x 10 bilateral  - figure 4 stretch 60 sec each in supine  - figure 4/piriformis stretch, hooklie 30s x2  - Sidelie clams 2x15 - Sidelie abduction with slight hip extension - seated LAQs, marching with latissimus press, 2# 15x alt. Ea. exercise - sit to stand 2 x 10 incorporating OH reach Manual Therapy: - n/a   Neuromuscular re-ed: - n/a   Therapeutic Activity: - n/a   Self-care/Home Management: - n/a                               PT Short  Term Goals - 02/21/21 1024       PT SHORT TERM GOAL #1   Title Pt will be indep with HEP to improve pain management of condition.    Baseline 12/20 requires cues    Status On-going    Target Date 03/01/21      PT SHORT TERM GOAL #2   Title Pt will be able to tolerate sidelying on Lt with minimal pain to improve bed mobility and sleep.    Baseline unable; 02/15/21 Able to tolerate L sidelie for eercises and treatment tasks    Status On-going    Target Date 03/01/21      PT SHORT TERM GOAL #3   Title Pt will be able to demonstrate correction of anterior pelvic tilt in standing to indicate improved pelvic control for optimize standing and walking function.    Baseline not assessed    Status Deferred    Target Date 03/01/21               PT Long Term Goals - 02/08/21 1747       PT LONG TERM GOAL #1   Title Pt will be able to achieve 5/5 strength on bilat hip MMTs to improve lumbopelvic stability during gait and occupational tasks.     Baseline see flowsheet    Status New    Target Date 03/22/21      PT LONG TERM GOAL #2   Title Pt will be able to report reduction in low back pain to </= 3/10 at rest to indicate improved pain management and activity tolerance.    Baseline see subjective report    Status New    Target Date 03/22/21      PT LONG TERM GOAL #3   Title Pt will be able to demonstrate proper gait with improve reciprical trunk rotation and decreased knee valgus to optimize walking function and activity tolerance in low back.    Baseline see flowsheet    Status New    Target Date 03/22/21                    Patient will benefit from skilled therapeutic intervention in order to improve the following deficits and impairments:     Visit Diagnosis: Chronic bilateral low back pain, unspecified whether sciatica present  Muscle weakness (generalized)     Problem List Patient Active Problem List   Diagnosis Date Noted   Fever 05/07/2014   Headache 05/07/2014   Cephalalgia    Other specified fever    Meningitis due to bacteria 08/17/2012   History of diabetes mellitus 08/17/2012   Weight loss 08/17/2012   Seasonal allergies 08/17/2012   History of MRSA infection 08/17/2012   Cigarette smoker 08/17/2012   Bipolar I disorder, most recent episode (or current) depressed, unspecified 11/14/2011   HTN (hypertension) 11/09/2011   Anxiety and depression 05/01/2011    Class: Chronic    Hildred Laser, PT 02/22/2021, 1:20 PM  [facility] [address] [place], [place], [address] Phone: [phone]   Fax:  [phone]  Name: Virginia Simmons MRN: [id number] Date of Birth: April 27, 1975

## 2021-03-02 ENCOUNTER — Other Ambulatory Visit: Payer: Self-pay

## 2021-03-02 ENCOUNTER — Ambulatory Visit: Payer: Medicaid Other | Admitting: Physical Therapy

## 2021-03-02 ENCOUNTER — Encounter: Payer: Self-pay | Admitting: Physical Therapy

## 2021-03-02 DIAGNOSIS — G8929 Other chronic pain: Secondary | ICD-10-CM

## 2021-03-02 DIAGNOSIS — M545 Low back pain, unspecified: Secondary | ICD-10-CM | POA: Diagnosis not present

## 2021-03-02 DIAGNOSIS — M6281 Muscle weakness (generalized): Secondary | ICD-10-CM

## 2021-03-02 NOTE — Therapy (Signed)
[facility] Outpatient Rehabilitation [facility] [address] [place], [place], [address] Phone: [phone]   Fax:  [phone]  Physical Therapy Treatment  Patient Details  Name: Virginia Simmons MRN: [id number] Date of Birth: June 07, 1975 Referring Provider (PT): Marvia Pickles, [place]   Encounter Date: 03/02/2021   PT End of Session - 03/02/21 0913     Visit Number 5    Number of Visits 13    Date for PT Re-Evaluation 03/25/21    Authorization Type UHC MCD    Authorization - Visit Number 4    Authorization - Number of Visits 27    PT Start Time 0915    PT Stop Time 0955    PT Time Calculation (min) 40 min    Activity Tolerance Patient tolerated treatment well    Behavior During Therapy [facility] for tasks assessed/performed             Past Medical History:  Diagnosis Date   Arthritis    Depression    Diabetes mellitus    Fibromyalgia lupus   Hypertension    Meningitis     Past Surgical History:  Procedure Laterality Date   CESAREAN SECTION  08/30/2011   Procedure: CESAREAN SECTION;  Surgeon: Bing Plume, MD;  Location: [facility];  Service: Gynecology;  Laterality: N/A;   CESAREAN SECTION     FOOT FRACTURE SURGERY     FOOT SURGERY     left    HAND SURGERY     HAND SURGERY     left   LAPAROSCOPIC TUBAL LIGATION N/A 05/28/2012   Procedure: LAPAROSCOPIC TUBAL LIGATION;  Surgeon: Bing Plume, MD;  Location: [facility];  Service: Gynecology;  Laterality: N/A;  laparoscopic tubal cauterization    There were no vitals filed for this visit.   Subjective Assessment - 03/02/21 0914     Subjective Patient reports she is doing well, she is consistent with her exercises. Patient states she got a new back brace from her doctor because she has really bad arthritis in her back.    Patient Stated Goals "[name] do what you want to do" (pain reduction)    Currently in Pain? Yes    Pain Score 6     Pain Location Back    Pain Orientation Lower    Pain Descriptors /  Indicators Aching;Dull    Pain Type Chronic pain    Pain Onset More than a month ago    Pain Frequency Intermittent    Aggravating Factors  activity, standing                [facility] PT Assessment - 03/02/21 0001       Strength   Right Hip Extension 4-/5    Right Hip ABduction 4-/5    Left Hip Extension 4-/5    Left Hip ABduction 4-/5                [facility] Adult PT Treatment/Exercise:  Therapeutic Exercise: NuStep L5 x 5 min with UE/LE while taking subjective PPT 10 x 5 sec hold - tactile cueing under lumbar for patient Hooklying physioball squeeze with alternating leg extension 2 x 10 Bridge 2 x 10 SLR with abdominal engagement 2 x 10 each Sidelying hip abduction 2 x 10 Sit to stand 3 x 10 - last 2 sets holding 10 lbs at chest Standing banded pull down with abdominal activation using green 2 x 10 Pallof press with green 2 x 10 each  PT Education - 03/02/21 0914     Education Details HEP, need to strengthen muscles rather than using back brace, progressing aerobic exerice (walking step count) to improve tolerance for work tasks and general health    Person(s) Educated Patient    Methods Explanation;Demonstration;Verbal cues;Handout    Comprehension Verbalized understanding;Returned demonstration;Verbal cues required;Need further instruction              PT Short Term Goals - 02/21/21 1024       PT SHORT TERM GOAL #1   Title Pt will be indep with HEP to improve pain management of condition.    Baseline 12/20 requires cues    Status On-going    Target Date 03/01/21      PT SHORT TERM GOAL #2   Title Pt will be able to tolerate sidelying on Lt with minimal pain to improve bed mobility and sleep.    Baseline unable; 02/15/21 Able to tolerate L sidelie for eercises and treatment tasks    Status On-going    Target Date 03/01/21      PT SHORT TERM GOAL #3   Title Pt will be able to demonstrate correction of anterior pelvic tilt in  standing to indicate improved pelvic control for optimize standing and walking function.    Baseline not assessed    Status Deferred    Target Date 03/01/21               PT Long Term Goals - 02/08/21 1747       PT LONG TERM GOAL #1   Title Pt will be able to achieve 5/5 strength on bilat hip MMTs to improve lumbopelvic stability during gait and occupational tasks.    Baseline see flowsheet    Status New    Target Date 03/22/21      PT LONG TERM GOAL #2   Title Pt will be able to report reduction in low back pain to </= 3/10 at rest to indicate improved pain management and activity tolerance.    Baseline see subjective report    Status New    Target Date 03/22/21      PT LONG TERM GOAL #3   Title Pt will be able to demonstrate proper gait with improve reciprical trunk rotation and decreased knee valgus to optimize walking function and activity tolerance in low back.    Baseline see flowsheet    Status New    Target Date 03/22/21                   Plan - 03/02/21 0914     Clinical Impression Statement Patient tolerated therapy well with no adverse effects. Therapy focused on continued strengthening of core and hips with emphasis on lumbopelvic control. She demonstrates great difficulty with coordinating movement and with endurance. No changes made to current HEP. She was educated on benefits of progressing her walking and importance of building stronger muscles to pain relief and control rather than need of a back brace which could lead to weaker muscles and dependence. Patient would benefit from continued skilled PT to progress her mobility and strength in order to reduce pain and maximize functional ability.    PT Treatment/Interventions ADLs/Self Care Home Management;Aquatic Therapy;Cryotherapy;Electrical Stimulation;Moist Heat;Traction;Gait training;Stair training;Functional mobility training;Therapeutic activities;Therapeutic exercise;Balance training;Neuromuscular  re-education;Patient/family education;Manual techniques;Taping;Passive range of motion;Spinal Manipulations;Dry needling    PT Next Visit Plan continue core and hip strengthening. monitor proper form    PT Home Exercise Plan [facility]    Consulted  and Agree with Plan of Care Patient             Patient will benefit from skilled therapeutic intervention in order to improve the following deficits and impairments:  Abnormal gait, Improper body mechanics, Impaired perceived functional ability, Decreased activity tolerance, Decreased strength, Pain  Visit Diagnosis: Chronic bilateral low back pain, unspecified whether sciatica present  Muscle weakness (generalized)     Problem List Patient Active Problem List   Diagnosis Date Noted   Fever 05/07/2014   Headache 05/07/2014   Cephalalgia    Other specified fever    Meningitis due to bacteria 08/17/2012   History of diabetes mellitus 08/17/2012   Weight loss 08/17/2012   Seasonal allergies 08/17/2012   History of MRSA infection 08/17/2012   Cigarette smoker 08/17/2012   Bipolar I disorder, most recent episode (or current) depressed, unspecified 11/14/2011   HTN (hypertension) 11/09/2011   Anxiety and depression 05/01/2011    Class: Chronic    Rosana Hoes, PT, DPT, LAT, ATC 03/02/21  9:56 AM Phone: [phone] Fax: [phone]   [facility] Outpatient Rehabilitation [facility] [address] [place], [place], [address] Phone: [phone]   Fax:  [phone]  Name: AKEELA BUSK MRN: [id number] Date of Birth: 1975-12-08

## 2021-03-03 ENCOUNTER — Ambulatory Visit: Payer: Medicaid Other | Admitting: Physical Therapy

## 2021-03-03 ENCOUNTER — Encounter: Payer: Self-pay | Admitting: Physical Therapy

## 2021-03-03 ENCOUNTER — Other Ambulatory Visit: Payer: Self-pay

## 2021-03-03 DIAGNOSIS — M6281 Muscle weakness (generalized): Secondary | ICD-10-CM

## 2021-03-03 DIAGNOSIS — M545 Low back pain, unspecified: Secondary | ICD-10-CM | POA: Diagnosis not present

## 2021-03-03 DIAGNOSIS — G8929 Other chronic pain: Secondary | ICD-10-CM

## 2021-03-03 NOTE — Therapy (Signed)
[facility] Outpatient Rehabilitation [facility] [address] [place], [place], [address] Phone: [phone]   Fax:  [phone]  Physical Therapy Treatment  Patient Details  Name: Virginia Simmons MRN: [id number] Date of Birth: May 09, 1975 Referring Provider (PT): Marvia Pickles, [place]   Encounter Date: 03/03/2021   PT End of Session - 03/03/21 1050     Visit Number 6    Number of Visits 13    Date for PT Re-Evaluation 03/25/21    Authorization Type [employer] MCD    Authorization - Visit Number 5    Authorization - Number of Visits 27    PT Start Time 1047    PT Stop Time 1127    PT Time Calculation (min) 40 min    Activity Tolerance Patient tolerated treatment well    Behavior During Therapy [facility] for tasks assessed/performed             Past Medical History:  Diagnosis Date   Arthritis    Depression    Diabetes mellitus    Fibromyalgia lupus   Hypertension    Meningitis     Past Surgical History:  Procedure Laterality Date   CESAREAN SECTION  08/30/2011   Procedure: CESAREAN SECTION;  Surgeon: Bing Plume, MD;  Location: [facility];  Service: Gynecology;  Laterality: N/A;   CESAREAN SECTION     FOOT FRACTURE SURGERY     FOOT SURGERY     left    HAND SURGERY     HAND SURGERY     left   LAPAROSCOPIC TUBAL LIGATION N/A 05/28/2012   Procedure: LAPAROSCOPIC TUBAL LIGATION;  Surgeon: Bing Plume, MD;  Location: [facility];  Service: Gynecology;  Laterality: N/A;  laparoscopic tubal cauterization    There were no vitals filed for this visit.   Subjective Assessment - 03/03/21 1052     Subjective Patient reports she is doing well today, denies any increased pain or soreness from her visit yesterday. She returns to work on January 4th for orientation and then full time at work on the 7th.    Patient Stated Goals "[name] do what you want to do" (pain reduction)    Currently in Pain? Yes    Pain Score 6     Pain Location Back    Pain Orientation Lower    Pain  Descriptors / Indicators Aching;Dull    Pain Type Chronic pain    Pain Onset More than a month ago    Pain Frequency Intermittent    Aggravating Factors  activity, standing                 [facility] Adult PT Treatment/Exercise:   Therapeutic Exercise: NuStep L6 x 8 min with UE/LE while taking subjective, working on aerobic capacity Bridge 2 x 10 - cue for gluteal engagement SLR with abdominal engagement 2 x 10 each Sidelying hip abduction 2 x 10 Sit to stand 3 x 10 holding 10 lbs at chest - focus on controlling all the way down Standing banded pull down with abdominal activation using green 2 x 10 - cued to avoid excessive lordosis, performing pelvic tilt Pallof press with green 2 x 10 each - cued to avoid excessive lordosis, performing pelvic tilt          PT Education - 03/03/21 1055     Education Details HEP update    Person(s) Educated Patient    Methods Explanation;Demonstration;Verbal cues;Handout    Comprehension Verbalized understanding;Returned demonstration;Verbal cues required;Need further instruction  PT Short Term Goals - 03/03/21 1111       PT SHORT TERM GOAL #1   Title Pt will be indep with HEP to improve pain management of condition.    Baseline updated HEP, she was independent with initial HEP    Status Achieved    Target Date 03/01/21      PT SHORT TERM GOAL #2   Title Pt will be able to tolerate sidelying on Lt with minimal pain to improve bed mobility and sleep.    Baseline patient reports continued discomfort with lying on left side    Status On-going    Target Date 03/01/21      PT SHORT TERM GOAL #3   Title Pt will be able to demonstrate correction of anterior pelvic tilt in standing to indicate improved pelvic control for optimize standing and walking function.    Baseline patient continues to demonstrate difficulty with pelvic tilt in standing, required constant cueing and poor endurance    Status On-going    Target  Date 03/01/21               PT Long Term Goals - 02/08/21 1747       PT LONG TERM GOAL #1   Title Pt will be able to achieve 5/5 strength on bilat hip MMTs to improve lumbopelvic stability during gait and occupational tasks.    Baseline see flowsheet    Status New    Target Date 03/22/21      PT LONG TERM GOAL #2   Title Pt will be able to report reduction in low back pain to </= 3/10 at rest to indicate improved pain management and activity tolerance.    Baseline see subjective report    Status New    Target Date 03/22/21      PT LONG TERM GOAL #3   Title Pt will be able to demonstrate proper gait with improve reciprical trunk rotation and decreased knee valgus to optimize walking function and activity tolerance in low back.    Baseline see flowsheet    Status New    Target Date 03/22/21                   Plan - 03/03/21 1055     Clinical Impression Statement Patient tolerated therapy well with no adverse effects. Therapy continued focus on progressing strength and control. Patient did not report any increase in pain with yesterday or today's session so updated her HEP with exercises progressions to continue promoting strength and core control in order to improe standing tolerance for work. She does require consistent cueing to avoid excessive lordosis and engaging the core during exercises, and demonstrates difficulty wiht poor endurance. Patient would benefit from continued skilled PT to progress her mobility and strength in order to reduce pain and maximize functional ability.    PT Treatment/Interventions ADLs/Self Care Home Management;Aquatic Therapy;Cryotherapy;Electrical Stimulation;Moist Heat;Traction;Gait training;Stair training;Functional mobility training;Therapeutic activities;Therapeutic exercise;Balance training;Neuromuscular re-education;Patient/family education;Manual techniques;Taping;Passive range of motion;Spinal Manipulations;Dry needling    PT Next  Visit Plan continue core and hip strengthening. monitor proper form    PT Home Exercise Plan [id number]    Consulted and Agree with Plan of Care Patient             Patient will benefit from skilled therapeutic intervention in order to improve the following deficits and impairments:  Abnormal gait, Improper body mechanics, Impaired perceived functional ability, Decreased activity tolerance, Decreased strength, Pain  Visit Diagnosis: Chronic bilateral low  back pain, unspecified whether sciatica present  Muscle weakness (generalized)     Problem List Patient Active Problem List   Diagnosis Date Noted   Fever 05/07/2014   Headache 05/07/2014   Cephalalgia    Other specified fever    Meningitis due to bacteria 08/17/2012   History of diabetes mellitus 08/17/2012   Weight loss 08/17/2012   Seasonal allergies 08/17/2012   History of MRSA infection 08/17/2012   Cigarette smoker 08/17/2012   Bipolar I disorder, most recent episode (or current) depressed, unspecified 11/14/2011   HTN (hypertension) 11/09/2011   Anxiety and depression 05/01/2011    Class: Chronic    Rosana Hoes, PT, DPT, LAT, ATC 03/03/21  11:28 AM Phone: [phone] Fax: [phone]   [facility] Outpatient Rehabilitation [facility] [address] [place], [place], [address] Phone: [phone]   Fax:  [phone]  Name: LESTER PLATAS MRN: [id number] Date of Birth: [birth year]

## 2021-03-07 ENCOUNTER — Other Ambulatory Visit: Payer: Self-pay

## 2021-03-07 ENCOUNTER — Ambulatory Visit: Payer: Medicaid Other | Attending: Physician Assistant

## 2021-03-07 DIAGNOSIS — G8929 Other chronic pain: Secondary | ICD-10-CM | POA: Diagnosis present

## 2021-03-07 DIAGNOSIS — M545 Low back pain, unspecified: Secondary | ICD-10-CM | POA: Insufficient documentation

## 2021-03-07 DIAGNOSIS — M6281 Muscle weakness (generalized): Secondary | ICD-10-CM | POA: Diagnosis present

## 2021-03-07 NOTE — Patient Instructions (Signed)

## 2021-03-07 NOTE — Therapy (Signed)
[facility] Outpatient Rehabilitation [facility] [address] [place], [place], [address] Phone: [phone]   Fax:  [phone]  Physical Therapy Treatment  Patient Details  Name: Virginia Simmons MRN: [id number] Date of Birth: Aug 14, 1975 Referring Provider (PT): Marvia Pickles, [place]   Encounter Date: 03/07/2021   PT End of Session - 03/07/21 6962     Visit Number 7    Number of Visits 13    Date for PT Re-Evaluation 03/25/21    Authorization Type UHC MCD    Authorization - Visit Number 6    Authorization - Number of Visits 27    PT Start Time 0930    PT Stop Time 1005    PT Time Calculation (min) 35 min    Activity Tolerance Other (comment)   session discontinued as patient reported increased GI discomfort   Behavior During Therapy [facility] for tasks assessed/performed             Past Medical History:  Diagnosis Date   Arthritis    Depression    Diabetes mellitus    Fibromyalgia lupus   Hypertension    Meningitis     Past Surgical History:  Procedure Laterality Date   CESAREAN SECTION  08/30/2011   Procedure: CESAREAN SECTION;  Surgeon: Bing Plume, MD;  Location: [facility];  Service: Gynecology;  Laterality: N/A;   CESAREAN SECTION     FOOT FRACTURE SURGERY     FOOT SURGERY     left    HAND SURGERY     HAND SURGERY     left   LAPAROSCOPIC TUBAL LIGATION N/A 05/28/2012   Procedure: LAPAROSCOPIC TUBAL LIGATION;  Surgeon: Bing Plume, MD;  Location: [facility];  Service: Gynecology;  Laterality: N/A;  laparoscopic tubal cauterization    There were no vitals filed for this visit.   Subjective Assessment - 03/07/21 0931     Subjective Patient reports the back is feeling worse this morning rated as 8/10. She returns to work on 1/7. She reports compliance with HEP, but feels these exercises make her back pain worse. She reports feeling very constipated this morning.    Currently in Pain? Yes    Pain Score 8     Pain Location Back    Pain Orientation  Lower    Pain Descriptors / Indicators Throbbing;Aching    Pain Type Chronic pain    Pain Onset More than a month ago    Pain Frequency Intermittent    Aggravating Factors  unknown    Pain Relieving Factors laying down on side              [facility] Adult PT Treatment/Exercise:   Therapeutic Exercise: NuStep L5 x 5 minutes  Seated posterior pelvic tilts multiple trials, heavy cues  Supine posterior pelvic tilts 2 x 10; 5 sec hold, heavy cues  Seated figure 4 stretch 1 x 30 sec each  Sit to stand 1 x 10, focusing on reducing excessive lordosis in standing.  Standing banded pull down with abdominal activation using red 1 x 10 - cued to avoid excessive lordosis, performing pelvic tilt Updated HEP.   Self-Care: Education on appropriate sitting and standing posture with handout provided.        [facility] PT Assessment - 03/07/21 0001       Posture/Postural Control   Posture/Postural Control Postural limitations    Postural Limitations Increased lumbar lordosis;Anterior pelvic tilt  PT Short Term Goals - 03/03/21 1111       PT SHORT TERM GOAL #1   Title Pt will be indep with HEP to improve pain management of condition.    Baseline updated HEP, she was independent with initial HEP    Status Achieved    Target Date 03/01/21      PT SHORT TERM GOAL #2   Title Pt will be able to tolerate sidelying on Lt with minimal pain to improve bed mobility and sleep.    Baseline patient reports continued discomfort with lying on left side    Status On-going    Target Date 03/01/21      PT SHORT TERM GOAL #3   Title Pt will be able to demonstrate correction of anterior pelvic tilt in standing to indicate improved pelvic control for optimize standing and walking function.    Baseline patient continues to demonstrate difficulty with pelvic tilt in standing, required constant cueing and poor endurance    Status On-going    Target  Date 03/01/21               PT Long Term Goals - 02/08/21 1747       PT LONG TERM GOAL #1   Title Pt will be able to achieve 5/5 strength on bilat hip MMTs to improve lumbopelvic stability during gait and occupational tasks.    Baseline see flowsheet    Status New    Target Date 03/22/21      PT LONG TERM GOAL #2   Title Pt will be able to report reduction in low back pain to </= 3/10 at rest to indicate improved pain management and activity tolerance.    Baseline see subjective report    Status New    Target Date 03/22/21      PT LONG TERM GOAL #3   Title Pt will be able to demonstrate proper gait with improve reciprical trunk rotation and decreased knee valgus to optimize walking function and activity tolerance in low back.    Baseline see flowsheet    Status New    Target Date 03/22/21                   Plan - 03/07/21 0944     Clinical Impression Statement Session focused on postural correction attempting to address her excessive lordosis through seated, supine, and standing activity. With heavy cues and reps she is able to achieve neutral position in supine, though continues to have difficulty performing a pelvic tilt in sitting and standing. During standing strengthening patient reported worsening of her GI complaints (initial reports of constipation at beginning of session) stating that she now felt as though she was going to vomit, though this did not happen in session. Session was discontinued early due to her GI symptoms with patient encouraged to continue with HEP focusing on postural awareness when performing exercises as she reported no back pain when posture was corrected during standing ther ex today.    PT Treatment/Interventions ADLs/Self Care Home Management;Aquatic Therapy;Cryotherapy;Electrical Stimulation;Moist Heat;Traction;Gait training;Stair training;Functional mobility training;Therapeutic activities;Therapeutic exercise;Balance  training;Neuromuscular re-education;Patient/family education;Manual techniques;Taping;Passive range of motion;Spinal Manipulations;Dry needling    PT Next Visit Plan continue core and hip strengthening. monitor proper form    PT Home Exercise Plan [id number]    Consulted and Agree with Plan of Care Patient             Patient will benefit from skilled therapeutic intervention in order to improve the following  deficits and impairments:  Abnormal gait, Improper body mechanics, Impaired perceived functional ability, Decreased activity tolerance, Decreased strength, Pain  Visit Diagnosis: Chronic bilateral low back pain, unspecified whether sciatica present  Muscle weakness (generalized)     Problem List Patient Active Problem List   Diagnosis Date Noted   Fever 05/07/2014   Headache 05/07/2014   Cephalalgia    Other specified fever    Meningitis due to bacteria 08/17/2012   History of diabetes mellitus 08/17/2012   Weight loss 08/17/2012   Seasonal allergies 08/17/2012   History of MRSA infection 08/17/2012   Cigarette smoker 08/17/2012   Bipolar I disorder, most recent episode (or current) depressed, unspecified 11/14/2011   HTN (hypertension) 11/09/2011   Anxiety and depression 05/01/2011    Class: Chronic  Virginia Simmons, PT, DPT, ATC 03/07/21 10:13 AM  [facility] [address] [place], [place], [address] Phone: [phone]   Fax:  [phone]  Name: Virginia Simmons MRN: [id number] Date of Birth: February 07, 1976

## 2021-03-09 ENCOUNTER — Encounter: Payer: Self-pay | Admitting: Physical Therapy

## 2021-03-09 ENCOUNTER — Other Ambulatory Visit: Payer: Self-pay

## 2021-03-09 ENCOUNTER — Ambulatory Visit: Payer: Medicaid Other | Admitting: Physical Therapy

## 2021-03-09 DIAGNOSIS — G8929 Other chronic pain: Secondary | ICD-10-CM

## 2021-03-09 DIAGNOSIS — M6281 Muscle weakness (generalized): Secondary | ICD-10-CM

## 2021-03-09 DIAGNOSIS — M545 Low back pain, unspecified: Secondary | ICD-10-CM | POA: Diagnosis not present

## 2021-03-09 NOTE — Therapy (Signed)
[facility] Outpatient Rehabilitation [facility] [address] [place], [place], [address] Phone: [phone]   Fax:  [phone]  Physical Therapy Treatment  Patient Details  Name: Virginia Simmons MRN: [id number] Date of Birth: [birth year] Referring Provider (PT): Marvia Pickles, [place]   Encounter Date: 03/09/2021   PT End of Session - 03/09/21 1008     Visit Number 8    Number of Visits 13    Date for PT Re-Evaluation 03/25/21    Authorization Type UHC MCD    Authorization - Visit Number 7    Authorization - Number of Visits 27    PT Start Time 1004    PT Stop Time 1042    PT Time Calculation (min) 38 min    Activity Tolerance Patient tolerated treatment well    Behavior During Therapy [facility] for tasks assessed/performed             Past Medical History:  Diagnosis Date   Arthritis    Depression    Diabetes mellitus    Fibromyalgia lupus   Hypertension    Meningitis     Past Surgical History:  Procedure Laterality Date   CESAREAN SECTION  08/30/2011   Procedure: CESAREAN SECTION;  Surgeon: Bing Plume, MD;  Location: [facility];  Service: Gynecology;  Laterality: N/A;   CESAREAN SECTION     FOOT FRACTURE SURGERY     FOOT SURGERY     left    HAND SURGERY     HAND SURGERY     left   LAPAROSCOPIC TUBAL LIGATION N/A 05/28/2012   Procedure: LAPAROSCOPIC TUBAL LIGATION;  Surgeon: Bing Plume, MD;  Location: [facility];  Service: Gynecology;  Laterality: N/A;  laparoscopic tubal cauterization    There were no vitals filed for this visit.   Subjective Assessment - 03/09/21 1011     Subjective Patient reports she is feeling pretty good, she had work orientation yesterday and will start back at work on Monday.    Patient Stated Goals "[name] do what you want to do" (pain reduction)    Currently in Pain? Yes    Pain Score 0-No pain    Pain Location Back    Pain Orientation Lower    Pain Descriptors / Indicators Aching;Tightness;Sore    Pain Type Chronic  pain    Pain Onset More than a month ago    Pain Frequency Intermittent    Aggravating Factors  Unknown    Pain Relieving Factors Rest                [facility] PT Assessment - 03/09/21 0001       Posture/Postural Control   Postural Limitations Increased lumbar lordosis;Anterior pelvic tilt                [facility] Adult PT Treatment/Exercise:   Therapeutic Exercise: NuStep L6 x 6 min with UE/LE while taking subjective, working on aerobic capacity PPT using belt for resistance feedback 2 x 10 with 5 sec hold Bridge 2 x 10 - cued to initiate with PPT Hooklying 90-90 abdominal isometric 2 x 8 with 5 sec hold - cued to initiate with PPT, lift/lower one leg at a time, and maintain control without arching back Sidelying hip abduction 2 x 10 Sit to stand 2 x 10 holding 10 lbs at chest with forward press when standing - focus on avoiding excessive lordosis Standing banded pull down with abdominal activation using green 2 x 15 - cued to avoid excessive  lordosis, performing pelvic tilt Deadlift with 25# from 4" box 3 x 8  - cued for proper technique and           PT Education - 03/09/21 1013     Education Details HEP    Person(s) Educated Patient    Methods Explanation;Demonstration;Verbal cues    Comprehension Verbalized understanding;Returned demonstration;Verbal cues required;Need further instruction              PT Short Term Goals - 03/03/21 1111       PT SHORT TERM GOAL #1   Title Pt will be indep with HEP to improve pain management of condition.    Baseline updated HEP, she was independent with initial HEP    Status Achieved    Target Date 03/01/21      PT SHORT TERM GOAL #2   Title Pt will be able to tolerate sidelying on Lt with minimal pain to improve bed mobility and sleep.    Baseline patient reports continued discomfort with lying on left side    Status On-going    Target Date 03/01/21      PT SHORT TERM GOAL #3   Title Pt will be able to  demonstrate correction of anterior pelvic tilt in standing to indicate improved pelvic control for optimize standing and walking function.    Baseline patient continues to demonstrate difficulty with pelvic tilt in standing, required constant cueing and poor endurance    Status On-going    Target Date 03/01/21               PT Long Term Goals - 02/08/21 1747       PT LONG TERM GOAL #1   Title Pt will be able to achieve 5/5 strength on bilat hip MMTs to improve lumbopelvic stability during gait and occupational tasks.    Baseline see flowsheet    Status New    Target Date 03/22/21      PT LONG TERM GOAL #2   Title Pt will be able to report reduction in low back pain to </= 3/10 at rest to indicate improved pain management and activity tolerance.    Baseline see subjective report    Status New    Target Date 03/22/21      PT LONG TERM GOAL #3   Title Pt will be able to demonstrate proper gait with improve reciprical trunk rotation and decreased knee valgus to optimize walking function and activity tolerance in low back.    Baseline see flowsheet    Status New    Target Date 03/22/21                   Plan - 03/09/21 1013     Clinical Impression Statement Patient tolerated therapy well with no avderse effects. Therapy focused on progressing core/hip strength and ability to maintain proper lumbopelvic control. She was able to porgress with difficulty and resistance of exercises this visit and did not report any pain with exercises but continues to demonstrate fatigue. She requires consistent cueing for lumbopelvic control but did respond well when using belt for resistance cue while performing PPT. Patient would benefit from continued skilled PT to progress her mobility and strength in order to reduce pain and maximize functional ability.    PT Treatment/Interventions ADLs/Self Care Home Management;Aquatic Therapy;Cryotherapy;Electrical Stimulation;Moist Heat;Traction;Gait  training;Stair training;Functional mobility training;Therapeutic activities;Therapeutic exercise;Balance training;Neuromuscular re-education;Patient/family education;Manual techniques;Taping;Passive range of motion;Spinal Manipulations;Dry needling    PT Next Visit Plan continue core and hip  strengthening. monitor proper form    PT Home Exercise Plan [id number]    Consulted and Agree with Plan of Care Patient             Patient will benefit from skilled therapeutic intervention in order to improve the following deficits and impairments:  Abnormal gait, Improper body mechanics, Impaired perceived functional ability, Decreased activity tolerance, Decreased strength, Pain  Visit Diagnosis: Chronic bilateral low back pain, unspecified whether sciatica present  Muscle weakness (generalized)     Problem List Patient Active Problem List   Diagnosis Date Noted   Fever 05/07/2014   Headache 05/07/2014   Cephalalgia    Other specified fever    Meningitis due to bacteria 08/17/2012   History of diabetes mellitus 08/17/2012   Weight loss 08/17/2012   Seasonal allergies 08/17/2012   History of MRSA infection 08/17/2012   Cigarette smoker 08/17/2012   Bipolar I disorder, most recent episode (or current) depressed, unspecified 11/14/2011   HTN (hypertension) 11/09/2011   Anxiety and depression 05/01/2011    Class: Chronic    Rosana Hoes, PT, DPT, LAT, ATC 03/09/21  10:42 AM Phone: [phone] Fax: [phone]   [facility] Outpatient Rehabilitation [facility] [address] [place], [place], [address] Phone: [phone]   Fax:  [phone]  Name: STASSI FADELY MRN: [id number] Date of Birth: [birth year]

## 2021-03-13 ENCOUNTER — Ambulatory Visit: Payer: Medicaid Other | Admitting: Physical Therapy

## 2021-03-13 ENCOUNTER — Other Ambulatory Visit: Payer: Self-pay

## 2021-03-13 ENCOUNTER — Encounter: Payer: Self-pay | Admitting: Physical Therapy

## 2021-03-13 DIAGNOSIS — M545 Low back pain, unspecified: Secondary | ICD-10-CM | POA: Diagnosis not present

## 2021-03-13 DIAGNOSIS — M6281 Muscle weakness (generalized): Secondary | ICD-10-CM

## 2021-03-13 NOTE — Therapy (Signed)
[place], [place], [address] Phone: [phone]   Fax:  [phone]  Physical Therapy Treatment  Patient Details  Name: Virginia Simmons MRN: [id number] Date of Birth: Jan 08, 1976 Referring Provider (PT): Conrad Melville, [place]   Encounter Date: 03/13/2021   PT End of Session - 03/13/21 1526     Visit Number 9    Number of Visits 13    Date for PT Re-Evaluation 03/25/21    Authorization Type [employer] MCD    Authorization - Visit Number 8    Authorization - Number of Visits 27    PT Start Time 7124    PT Stop Time 1600   5 min spent discussing discharge planning and and lack of progress toward goals   PT Time Calculation (min) 45 min    Activity Tolerance Patient tolerated treatment well    Behavior During Therapy [facility] for tasks assessed/performed             Past Medical History:  Diagnosis Date   Arthritis    Depression    Diabetes mellitus    Fibromyalgia lupus   Hypertension    Meningitis     Past Surgical History:  Procedure Laterality Date   CESAREAN SECTION  08/30/2011   Procedure: CESAREAN SECTION;  Surgeon: Melina Schools, MD;  Location: [facility];  Service: Gynecology;  Laterality: N/A;   CESAREAN SECTION     FOOT FRACTURE SURGERY     FOOT SURGERY     left    HAND SURGERY     HAND SURGERY     left   LAPAROSCOPIC TUBAL LIGATION N/A 05/28/2012   Procedure: LAPAROSCOPIC TUBAL LIGATION;  Surgeon: Melina Schools, MD;  Location: [facility];  Service: Gynecology;  Laterality: N/A;  laparoscopic tubal cauterization    There were no vitals filed for this visit.   Subjective Assessment - 03/13/21 1528     Subjective Patient reports she has started back to work and her back still really bothers her. She has been working on the exercises at home, she states the soreness is not too bad but she feels like she continues to have pain and she just needs to lose some weight to feel better. She also feels she  needs an MRI and nerve test to tell what is wrong with her.    Limitations Standing;Walking;House hold activities;Sitting;Lifting    How long can you sit comfortably? "a couple minutes"    How long can you stand comfortably? "can stand better than sit, but still has pain"    Patient Stated Goals "Earlie Server do what you want to do" (pain reduction)    Currently in Pain? Yes    Pain Score 8     Pain Location Back    Pain Orientation Lower    Pain Descriptors / Indicators Aching;Tightness;Burning    Pain Type Chronic pain    Pain Onset More than a month ago    Pain Frequency Intermittent    Aggravating Factors  Sitting, standing, walking, work related tasks    Pain Relieving Factors Rest, medication                [facility] PT Assessment - 03/13/21 0001       Assessment   Medical Diagnosis Back pain    Referring Provider (PT) Radene Knee, Joe C, PA-C      Precautions   Precautions None      Restrictions   Weight Bearing Restrictions No  Balance Screen   Has the patient fallen in the past 6 months No      Prior Function   Level of Independence Independent    Vocation Full time employment    Vocation Requirements Patient works in Haematologist at [employer]   Overall Cognitive Status Within [place] for tasks assessed      Observation/Other Assessments   Focus on Therapeutic Outcomes (FOTO)  NA - MCD      Posture/Postural Control   Posture Comments Excessive lumbar lordosis with difficulty correcting when cued      AROM   Lumbar Flexion Able to reach floor with forward bend, minimal reversal of lumbar lordosis      Strength   Overall Strength Comments Core strength grossly 4-/5 MMT    Right Hip Flexion 4-/5    Right Hip Extension 4-/5    Right Hip ABduction 4-/5    Left Hip Flexion 4-/5    Left Hip Extension 4-/5    Left Hip ABduction 4-/5    Right Knee Flexion 5/5    Right Knee Extension 5/5    Left Knee Flexion 5/5    Left Knee  Extension 5/5      Flexibility   Hamstrings WFL without any radicular symptoms      Ambulation/Gait   Gait Comments Patient with improve trunk rotation with gait, continues to exhibit bilat knee valgus and toe out with L > R trendelenburg                   [facility] Adult PT Treatment/Exercise:   Therapeutic Exercise: NuStep L6 x 5 min with UE/LE while taking subjective, working on aerobic capacity LTR x 10 Piriformis stretch 2 x 30 sec each PPT using belt for resistance feedback 2 x 10 with 5 sec hold Bridge 2 x 10 - cued to initiate with PPT SLR x 10 each Hooklying 90-90 abdominal isometric 2 x 5 with 5 sec hold - cued to initiate with PPT, lift/lower one leg at a time, and maintain control without arching back Sidelying hip abduction 2 x 10 Sit to stand 2 x 10 holding 10 lbs at chest with forward press when standing - focus on avoiding excessive lordosis Standing springboard extension with yellow springs 2 x 10 - cued to avoid excessive lordosis, performing pelvic tilt Deadlift with 25# from 4" box 2 x 8  - cued for proper technique and avoiding excessive lumbar lordosis/extension when standing         PT Education - 03/13/21 1533     Education Details POC discharge due to lack of progress with therapy, follow-up with doctor and possible referral for nutritionist    Person(s) Educated Patient    Methods Explanation    Comprehension Verbalized understanding              PT Short Term Goals - 03/13/21 1546       PT SHORT TERM GOAL #1   Title Pt will be indep with HEP to improve pain management of condition.    Baseline updated HEP, she was independent with initial HEP    Status Achieved    Target Date 03/01/21      PT SHORT TERM GOAL #2   Title Pt will be able to tolerate sidelying on Lt with minimal pain to improve bed mobility and sleep.    Baseline patient reports continued pain with lying on left side    Status Not  Met    Target Date 03/01/21       PT SHORT TERM GOAL #3   Title Pt will be able to demonstrate correction of anterior pelvic tilt in standing to indicate improved pelvic control for optimize standing and walking function.    Baseline patient continues to demonstrate difficulty with pelvic tilt in standing, required constant cueing and poor endurance    Status Not Met    Target Date 03/01/21               PT Long Term Goals - 03/13/21 1547       PT LONG TERM GOAL #1   Title Pt will be able to achieve 5/5 strength on bilat hip MMTs to improve lumbopelvic stability during gait and occupational tasks.    Baseline patient continues to demonstrate gross hip and core strength deficits    Status Not Met    Target Date 03/22/21      PT LONG TERM GOAL #2   Title Pt will be able to report reduction in low back pain to </= 3/10 at rest to indicate improved pain management and activity tolerance.    Baseline patient reports 8/10 pain    Status Not Met    Target Date 03/22/21      PT LONG TERM GOAL #3   Title Pt will be able to demonstrate proper gait with improve reciprical trunk rotation and decreased knee valgus to optimize walking function and activity tolerance in low back.    Baseline patient with improved trunk rotation but continues to exhibits knee valgus and hip weakness    Status Partially Met    Target Date 03/22/21                   Plan - 03/13/21 1539     Clinical Impression Statement Patient tolerated therapy well with no avderse effects. She continues to report ongoing low back pain and minimal progress with therapy through 9 visits so patient will be discharged from therapy due to lack of progress. Patient continues to report that she needs further imaging and to lose weight in order to improve her pain so she was encouraged to consult her doctor regarding a nutritionist referral. Patient was instructed to follow up if needed with a new referral and continue with her current HEP.    PT  Treatment/Interventions ADLs/Self Care Home Management;Aquatic Therapy;Cryotherapy;Electrical Stimulation;Moist Heat;Traction;Gait training;Stair training;Functional mobility training;Therapeutic activities;Therapeutic exercise;Balance training;Neuromuscular re-education;Patient/family education;Manual techniques;Taping;Passive range of motion;Spinal Manipulations;Dry needling    PT Next Visit Plan NA - discharge    PT Home Exercise Plan [id number]    Consulted and Agree with Plan of Care Patient             Patient will benefit from skilled therapeutic intervention in order to improve the following deficits and impairments:  Abnormal gait, Improper body mechanics, Impaired perceived functional ability, Decreased activity tolerance, Decreased strength, Pain  Visit Diagnosis: Chronic bilateral low back pain, unspecified whether sciatica present  Muscle weakness (generalized)     Problem List Patient Active Problem List   Diagnosis Date Noted   Fever 05/07/2014   Headache 05/07/2014   Cephalalgia    Other specified fever    Meningitis due to bacteria 08/17/2012   History of diabetes mellitus 08/17/2012   Weight loss 08/17/2012   Seasonal allergies 08/17/2012   History of MRSA infection 08/17/2012   Cigarette smoker 08/17/2012   Bipolar I disorder, most recent episode (or current) depressed, unspecified 11/14/2011  HTN (hypertension) 11/09/2011   Anxiety and depression 05/01/2011    Class: Chronic    Hilda Blades, PT, DPT, LAT, ATC 03/13/21  4:06 PM Phone: [phone] Fax: [facility] [address] [place], [place], [address] Phone: [phone]   Fax:  [phone]  Name: Virginia Simmons MRN: [id number] Date of Birth: [birth year]   PHYSICAL THERAPY DISCHARGE SUMMARY  Visits from Start of Care: 9  Current functional level related to goals / functional outcomes: See above   Remaining  deficits: See above   Education / Equipment: HEP   Patient agrees to discharge. Patient goals were not met. Patient is being discharged due to lack of progress.

## 2021-03-14 ENCOUNTER — Ambulatory Visit: Payer: Medicaid Other

## 2021-03-21 ENCOUNTER — Ambulatory Visit: Payer: Medicaid Other

## 2021-07-05 ENCOUNTER — Ambulatory Visit: Payer: Medicaid Other | Admitting: Family Medicine

## 2023-03-19 ENCOUNTER — Ambulatory Visit
Admission: EM | Admit: 2023-03-19 | Discharge: 2023-03-19 | Disposition: A | Discharge status: Home / Self Care | Payer: Medicaid Other

## 2023-03-19 DIAGNOSIS — B3731 Acute candidiasis of vulva and vagina: Secondary | ICD-10-CM | POA: Insufficient documentation

## 2023-03-19 DIAGNOSIS — H9202 Otalgia, left ear: Secondary | ICD-10-CM | POA: Diagnosis not present

## 2023-03-19 LAB — POCT RAPID STREP A (OFFICE): Rapid Strep A Screen: NEGATIVE

## 2023-03-19 NOTE — ED Provider Notes (Signed)
 [facility]    CSN: [id number] Arrival date & time: 03/19/23  1301      History   Chief Complaint Chief Complaint  Patient presents with   Otalgia    HPI Virginia Simmons is a 48 y.o. female.   48 year old female, Virginia Simmons, presents to urgent care for evaluation of left ear pain.  Patient is currently on antibiotics/ear drops.  The history is provided by the patient. No language interpreter was used.    Past Medical History:  Diagnosis Date   Arthritis    Depression    Diabetes mellitus    Fibromyalgia lupus   Hypertension    Meningitis     Patient Active Problem List   Diagnosis Date Noted   Candidal vulvovaginitis 03/19/2023   Otalgia, left 03/19/2023   Bilateral carpal tunnel syndrome 02/19/2020   Obesity 02/19/2020   Fever 05/07/2014   Headache 05/07/2014   Cephalalgia    Other specified fever    Meningitis due to bacteria 08/17/2012   History of diabetes mellitus 08/17/2012   Weight loss 08/17/2012   Seasonal allergies 08/17/2012   History of MRSA infection 08/17/2012   Cigarette smoker 08/17/2012   Bipolar I disorder, most recent episode depressed (HCC) 11/14/2011   HTN (hypertension) 11/09/2011   Anxiety and depression 05/01/2011    Class: Chronic    Past Surgical History:  Procedure Laterality Date   CESAREAN SECTION  08/30/2011   Procedure: CESAREAN SECTION;  Surgeon: Debby JULIANNA Lares, MD;  Location: [facility];  Service: Gynecology;  Laterality: N/A;   CESAREAN SECTION     FOOT FRACTURE SURGERY     FOOT SURGERY     left    HAND SURGERY     HAND SURGERY     left   LAPAROSCOPIC TUBAL LIGATION N/A 05/28/2012   Procedure: LAPAROSCOPIC TUBAL LIGATION;  Surgeon: Debby JULIANNA Lares, MD;  Location: [facility];  Service: Gynecology;  Laterality: N/A;  laparoscopic tubal cauterization    OB History     Gravida  2   Para  1   Term  1   Preterm  0   AB  0   Living  1      SAB  0   IAB  0   Ectopic  0   Multiple  0   Live  Births  1            Home Medications    Prior to Admission medications   Medication Sig Start Date End Date Taking? Authorizing Provider  albuterol  (PROAIR  HFA) 108 (90 Base) MCG/ACT inhaler ProAir  HFA 90 mcg/actuation aerosol inhaler  INHALE 2 PUFFS 4 TIMES A DAY AS NEEDED 06/11/19  Yes [provider]  fluticasone  (FLONASE ) 50 MCG/ACT nasal spray Place 1 spray into both nostrils 2 (two) times daily.   Yes [provider]  ACCU-CHEK GUIDE test strip E1 1.65, FOR CAPILLARY BLOOD GLUCOSE TESTING MORNING AND TWO HOURS AFTER SUPPER. 03/01/20   [provider]  Accu-Chek Softclix Lancets lancets daily. 11/04/22   [provider]  ALPRAZolam  (XANAX ) 0.25 MG tablet Take 1 tablet by mouth 2 (two) times daily.    [provider]  ALPRAZolam  (XANAX ) 0.5 MG tablet Take 1 tablet by mouth 2 (two) times daily.    [provider]  ALPRAZolam  (XANAX ) 1 MG tablet Take 1 mg by mouth at bedtime as needed for sleep (sleep).     [provider]  ALPRAZolam  (XANAX ) 1 MG tablet Take  1 tablet by mouth 2 (two) times daily.    [provider]  amoxicillin  (AMOXIL ) 875 MG tablet Take 875 mg by mouth 2 (two) times daily. 02/06/23   [provider]  amoxicillin -clavulanate (AUGMENTIN ) 875-125 MG tablet Take 1 tablet by mouth 2 (two) times daily.    [provider]  ARIPiprazole (ABILIFY) 5 MG tablet Take 1 tablet by mouth at bedtime. 06/29/19   [provider]  atenolol -chlorthalidone (TENORETIC) 50-25 MG per tablet Take 1 tablet by mouth daily.    [provider]  atorvastatin (LIPITOR) 10 MG tablet Take 1 tablet by mouth at bedtime. 06/11/19   [provider]  azithromycin (ZITHROMAX) 250 MG tablet Take 250 mg by mouth as directed.    [provider]  benztropine  (COGENTIN ) 1 MG tablet Take 1 tablet by mouth daily.    [provider]  Calcium  Carb-Cholecalciferol 600-10 MG-MCG CAPS Take 1  capsule by mouth 2 (two) times daily.    [provider]  cefUROXime  (CEFTIN ) 250 MG tablet Take 1 tablet by mouth 2 (two) times daily.    [provider]  cephALEXin  (KEFLEX ) 500 MG capsule Take 500 mg by mouth 4 (four) times daily.    [provider]  cetirizine (ZYRTEC) 10 MG tablet Take 10 mg by mouth daily. 02/06/23   [provider]  ciprofloxacin (CIPRO) 500 MG tablet Take 1 tablet by mouth 2 (two) times daily.    [provider]  clarithromycin (BIAXIN) 500 MG tablet Take 500 mg by mouth as directed.    [provider]  cyclobenzaprine  (FLEXERIL ) 10 MG tablet Take 1 tablet (10 mg total) by mouth 2 (two) times daily as needed for muscle spasms. 07/10/20   Griselda Norris, MD  diclofenac Sodium (VOLTAREN) 1 % GEL Apply 2 g topically 4 (four) times daily. 10/22/22   [provider]  docusate sodium  (COLACE) 100 MG capsule Take 1 capsule (100 mg total) by mouth every 12 (twelve) hours. 11/06/20   Emelia Sluder, PA-C  doxycycline (VIBRA-TABS) 100 MG tablet Take 1 tablet by mouth 2 (two) times daily.    [provider]  Dulaglutide (TRULICITY) 0.75 MG/0.5ML SOAJ Inject 0.75 mg into the skin as directed. 01/11/20   [provider]  ergocalciferol (VITAMIN D2) 1.25 MG (50000 UT) capsule ergocalciferol (vitamin D2) 1,250 mcg (50,000 unit) capsule    [provider]  fexofenadine  (ALLEGRA ) 180 MG tablet Take 1 tablet (180 mg total) by mouth daily. 10/28/11 10/27/12  Vincente Lynwood BIRCH, MD  fexofenadine  (ALLEGRA ) 180 MG tablet Take 1 tablet by mouth daily.    [provider]  fluconazole (DIFLUCAN) 150 MG tablet Take 1 tablet by mouth once a week.    [provider]  fluticasone  (FLONASE ) 50 MCG/ACT nasal spray Place 1 spray into the nose 2 (two) times daily. 10/28/11 10/27/12  Vincente Lynwood BIRCH, MD  folic acid (FOLVITE) 1 MG tablet folic acid 1 mg tablet    [provider]  folic acid (FOLVITE) 1 MG tablet  Take 1 tablet by mouth daily.    [provider]  haloperidol  (HALDOL ) 5 MG tablet haloperidol  5 mg tablet  TAKE 1 TABLET (5 MG TOTAL) BY MOUTH AT BEDTIME.    [provider]  haloperidol  (HALDOL ) 5 MG tablet Take 1 tablet by mouth at bedtime.    [provider]  HYDROcodone -acetaminophen  (NORCO) 10-325 MG tablet Take 1-2 tablets by mouth every 6 (six) hours as needed. 05/05/21   [provider]  HYDROcodone -acetaminophen  (NORCO/VICODIN) 5-325 MG tablet Take 1-2 tablets by mouth every 6 (six) hours as needed. 02/05/20   [provider]  hydrocortisone  2.5 % cream hydrocortisone  2.5 % topical cream    [provider]  hydroxychloroquine (PLAQUENIL) 200 MG tablet Take 2 tablets by mouth daily. 05/13/20   [provider]  hydrOXYzine  (ATARAX /VISTARIL ) 25 MG tablet hydroxyzine  HCl 25 mg tablet  TAKE 1 CAPSULE (25 MG TOTAL) BY MOUTH 2 (TWO) TIMES DAILY.    [provider]  hydrOXYzine  (VISTARIL ) 25 MG capsule Take 25 mg by mouth as needed.    [provider]  ibuprofen  (ADVIL ) 800 MG tablet Take 1 tablet by mouth 3 (three) times daily.    [provider]  ibuprofen  (ADVIL ,MOTRIN ) 600 MG tablet TAKE 1 TABLET (600 MG TOTAL) BY MOUTH EVERY 6 (SIX) HOURS AS NEEDED FOR PAIN. 05/20/14   Poe, Deirdre C, CNM  ibuprofen  (ADVIL ,MOTRIN ) 800 MG tablet Take 1 tablet (800 mg total) by mouth 3 (three) times daily. Patient not taking: Reported on 05/05/2014 04/01/12   Vincente Lynwood BIRCH, MD  leflunomide (ARAVA) 10 MG tablet Take 10 mg by mouth as directed.    [provider]  levofloxacin  (LEVAQUIN ) 500 MG tablet Take 1 tablet (500 mg total) by mouth daily. 05/08/14   Samtani, Jai-Gurmukh, MD  lidocaine  (LIDODERM ) 5 % Place 1 patch onto the skin as directed.    [provider]  lisinopril (ZESTRIL) 10 MG tablet Take 10 mg by mouth daily. 03/11/23   [provider]  lisinopril (ZESTRIL) 5 MG tablet Take 5 mg by mouth  daily. 02/04/20   [provider]  loxapine  (LOXITANE ) 5 MG capsule Take 5 mg by mouth at bedtime.    [provider]  medroxyPROGESTERone (DEPO-PROVERA) 150 MG/ML injection Inject 150 mg into the muscle every 3 (three) months.    [provider]  meloxicam (MOBIC) 15 MG tablet Take 15 mg by mouth daily. 02/19/20   [provider]  metaxalone  (SKELAXIN ) 800 MG tablet Take 1 tablet (800 mg total) by mouth 3 (three) times daily. 07/15/16   Jason Leita Blush, MD  methotrexate (RHEUMATREX) 2.5 MG tablet Take 2.5 mg by mouth as directed.    [provider]  metroNIDAZOLE (METROGEL) 0.75 % vaginal gel Place 1 Applicatorful vaginally at bedtime.    [provider]  misoprostol (CYTOTEC) 200 MCG tablet Take 800 mcg by mouth as directed.    [provider]  omeprazole (PRILOSEC) 20 MG capsule Take 20 mg by mouth daily. 12/18/22   [provider]  ondansetron  (ZOFRAN  ODT) 4 MG disintegrating tablet Take 1 tablet (4 mg total) by mouth every 8 (eight) hours as needed for nausea or vomiting. 11/06/20   Emelia Sluder, PA-C  ondansetron  (ZOFRAN -ODT) 8 MG disintegrating tablet Take 8 mg by mouth 2 (two) times daily.    [provider]  oxyCODONE -acetaminophen  (PERCOCET) 10-325 MG tablet Take 1 tablet by mouth every 6 (six) hours as needed.    [provider]  oxyCODONE -acetaminophen  (PERCOCET/ROXICET) 5-325 MG per tablet Take 1-2 tablets by mouth every 4 (four) hours as needed for severe pain. 05/05/14   Dasie Faden, MD  OZEMPIC, 1 MG/DOSE, 4 MG/3ML SOPN Inject 1 mg into the skin once a week. 02/14/23   [provider]  predniSONE  (DELTASONE ) 10 MG tablet Take 1 tablet by mouth daily.    [provider]  predniSONE  (DELTASONE ) 20 MG tablet Take 20 mg by mouth as  directed.    [provider]  predniSONE  (DELTASONE ) 5 MG tablet Take 2 tablets by mouth daily.    [provider]   promethazine -codeine (PHENERGAN  WITH CODEINE) 6.25-10 MG/5ML syrup Take 5 mLs by mouth every 6 (six) hours as needed for cough.    [provider]  QUEtiapine  (SEROQUEL ) 100 MG tablet quetiapine  100 mg tablet  TAKE 1 TABLET (100 MG TOTAL) BY MOUTH AT BEDTIME.    [provider]  QUEtiapine  (SEROQUEL ) 100 MG tablet Take 1 tablet by mouth at bedtime.    [provider]  sodium chloride  (OCEAN) 0.65 % SOLN nasal spray Place 1 spray into the nose as needed for congestion. Patient not taking: Reported on 05/05/2014 12/01/12   Kabbe, Angela M, NP  sulfamethoxazole -trimethoprim  (BACTRIM  DS) 800-160 MG tablet Take 1 tablet by mouth 2 (two) times daily.    [provider]  terconazole (TERAZOL 7) 0.4 % vaginal cream Place 1 applicator vaginally at bedtime.    [provider]  traMADol  (ULTRAM ) 50 MG tablet Take 1 tablet (50 mg total) by mouth every 6 (six) hours as needed. Patient not taking: Reported on 05/05/2014 07/05/13   Corey, Evan S, MD  traMADol -acetaminophen  (ULTRACET) 37.5-325 MG tablet Take 1 tablet by mouth every 8 (eight) hours as needed.    [provider]  triamterene-hydrochlorothiazide (DYAZIDE) 37.5-25 MG capsule Take 1 capsule by mouth daily. 06/11/19   [provider]  valACYclovir  (VALTREX ) 1000 MG tablet Take 1 tablet (1,000 mg total) by mouth 3 (three) times daily. Patient not taking: Reported on 05/05/2014 07/05/13   Corey, Evan S, MD  valACYclovir  (VALTREX ) 1000 MG tablet Take 1 tablet (1,000 mg total) by mouth 3 (three) times daily. 06/19/15   Belvie Dempsey BROCKS, PA    Family History Family History  Problem Relation Age of Onset   Hypertension Father    Diabetes Father    Drug abuse Father    Bipolar disorder Sister     Social History Social History   Tobacco Use   Smoking status: Every Day    Current packs/day: 0.25    Average packs/day: 0.3 packs/day for 10.0 years (2.5 ttl pk-yrs)    Types: Cigarettes    Passive  exposure: Current   Smokeless tobacco: Never  Vaping Use   Vaping status: Never Used  Substance Use Topics   Alcohol use: No   Drug use: No     Allergies   Influenza vaccines and Pneumococcal vaccines   Review of Systems Review of Systems  Constitutional:  Negative for fever.  HENT:  Positive for ear pain. Negative for ear discharge.   All other systems reviewed and are negative.    Physical Exam Triage Vital Signs ED Triage Vitals  Encounter Vitals Group     BP 03/19/23 1402 135/84     Systolic BP Percentile --      Diastolic BP Percentile --      Pulse Rate 03/19/23 1402 (!) 118     Resp 03/19/23 1402 20     Temp 03/19/23 1402 98.3 F (36.8 C)     Temp Source 03/19/23 1402 Oral     SpO2 03/19/23 1402 98 %     Weight 03/19/23 1357 245 lb (111.1 kg)     Height 03/19/23 1357 5' 4 (1.626 m)     Head Circumference --      Peak Flow --      Pain Score 03/19/23 1354 4     Pain  Loc --      Pain Education --      Exclude from Growth Chart --    No data found.  Updated Vital Signs BP 135/84 (BP Location: Left Arm)   Pulse (!) 109   Temp 98.3 F (36.8 C) (Oral)   Resp 20   Ht 5' 4 (1.626 m)   Wt 245 lb (111.1 kg)   LMP 03/14/2023 (Exact Date)   SpO2 98%   BMI 42.05 kg/m   Visual Acuity Right Eye Distance:   Left Eye Distance:   Bilateral Distance:    Right Eye Near:   Left Eye Near:    Bilateral Near:     Physical Exam Vitals and nursing note reviewed.  Constitutional:      General: She is not in acute distress.    Appearance: She is well-developed.  HENT:     Head: Normocephalic.     Right Ear: Tympanic membrane normal.     Left Ear: Tympanic membrane normal.     Ears:     Comments: No mastoid tenderness, no erythema, no rash    Nose: Congestion present.     Mouth/Throat:     Lips: Pink.     Mouth: Mucous membranes are moist.     Pharynx: Oropharynx is clear.  Eyes:     General: Lids are normal.     Conjunctiva/sclera: Conjunctivae  normal.     Pupils: Pupils are equal, round, and reactive to light.  Neck:     Trachea: No tracheal deviation.  Cardiovascular:     Rate and Rhythm: Normal rate and regular rhythm.     Pulses: Normal pulses.     Heart sounds: Normal heart sounds. No murmur heard. Pulmonary:     Effort: Pulmonary effort is normal.     Breath sounds: Normal breath sounds and air entry.  Abdominal:     General: Bowel sounds are normal.     Palpations: Abdomen is soft.     Tenderness: There is no abdominal tenderness.  Musculoskeletal:        General: Normal range of motion.     Cervical back: Normal range of motion.  Lymphadenopathy:     Cervical: No cervical adenopathy.  Skin:    General: Skin is warm and dry.     Findings: No rash.  Neurological:     General: No focal deficit present.     Mental Status: She is alert and oriented to person, place, and time.     GCS: GCS eye subscore is 4. GCS verbal subscore is 5. GCS motor subscore is 6.  Psychiatric:        Speech: Speech normal.        Behavior: Behavior normal. Behavior is cooperative.      UC Treatments / Results  Labs (all labs ordered are listed, but only abnormal results are displayed) Labs Reviewed  POCT RAPID STREP A (OFFICE) - Normal    EKG   Radiology No results found.  Procedures Procedures (including critical care time)  Medications Ordered in UC Medications - No data to display  Initial Impression / Assessment and Plan / UC Course  I have reviewed the triage vital signs and the nursing notes.  Pertinent labs & imaging results that were available during my care of the patient were reviewed by me and considered in my medical decision making (see chart for details).    Discussed exam findings and plan of care with patient, strict go to ER  precautions given.   Patient verbalized understanding to this provider.  Ddx: Left otalgia, trigeminal neuralgia, viral illness Final Clinical Impressions(s) / UC Diagnoses    Final diagnoses:  Otalgia, left     Discharge Instructions      Please follow up with your PCP. Your strep test is negative, your ear is not infected at today's visit.  Take home meds as directed. Return as needed.     ED Prescriptions   None    PDMP not reviewed this encounter.   Aminta Loose, NP 03/19/23 2021

## 2023-03-19 NOTE — Discharge Instructions (Addendum)
 Please follow up with your PCP. Your strep test is negative, your ear is not infected at today's visit.  Take home meds as directed. Return as needed.

## 2023-03-19 NOTE — ED Triage Notes (Signed)
"  I am having Left Ear pain, I did go to my regular doctor for this". "Prior to this I have had a cold for a while". "I am on antibiotics but I think I threw them in the garbage, I am still doing the Rx drops". No fever.

## 2023-05-10 ENCOUNTER — Other Ambulatory Visit: Payer: Self-pay | Admitting: Family

## 2023-05-10 DIAGNOSIS — Z1231 Encounter for screening mammogram for malignant neoplasm of breast: Secondary | ICD-10-CM

## 2023-05-15 ENCOUNTER — Ambulatory Visit
Admission: RE | Admit: 2023-05-15 | Discharge: 2023-05-15 | Disposition: A | Discharge status: Home / Self Care | Source: Ambulatory Visit | Attending: Family

## 2023-05-15 DIAGNOSIS — Z1231 Encounter for screening mammogram for malignant neoplasm of breast: Secondary | ICD-10-CM

## 2023-08-18 ENCOUNTER — Other Ambulatory Visit: Payer: Self-pay

## 2023-08-18 ENCOUNTER — Encounter (HOSPITAL_COMMUNITY): Payer: Self-pay

## 2023-08-18 ENCOUNTER — Emergency Department (HOSPITAL_COMMUNITY)
Admission: EM | Admit: 2023-08-18 | Discharge: 2023-08-18 | Disposition: A | Discharge status: Home / Self Care | Attending: Emergency Medicine | Admitting: Emergency Medicine

## 2023-08-18 DIAGNOSIS — J029 Acute pharyngitis, unspecified: Secondary | ICD-10-CM | POA: Diagnosis not present

## 2023-08-18 DIAGNOSIS — R0981 Nasal congestion: Secondary | ICD-10-CM | POA: Diagnosis not present

## 2023-08-18 DIAGNOSIS — Z79899 Other long term (current) drug therapy: Secondary | ICD-10-CM | POA: Insufficient documentation

## 2023-08-18 DIAGNOSIS — K0889 Other specified disorders of teeth and supporting structures: Secondary | ICD-10-CM | POA: Insufficient documentation

## 2023-08-18 DIAGNOSIS — I10 Essential (primary) hypertension: Secondary | ICD-10-CM | POA: Diagnosis not present

## 2023-08-18 DIAGNOSIS — H9202 Otalgia, left ear: Secondary | ICD-10-CM | POA: Diagnosis present

## 2023-08-18 LAB — GROUP A STREP BY PCR: Group A Strep by PCR: NOT DETECTED

## 2023-08-18 LAB — RESP PANEL BY RT-PCR (RSV, FLU A&B, COVID)  RVPGX2
Influenza A by PCR: NEGATIVE
Influenza B by PCR: NEGATIVE
Resp Syncytial Virus by PCR: NEGATIVE
SARS Coronavirus 2 by RT PCR: NEGATIVE

## 2023-08-18 MED ORDER — OXYCODONE-ACETAMINOPHEN 5-325 MG PO TABS
1.0000 | ORAL_TABLET | Freq: Once | ORAL | Status: AC
  Administered 2023-08-18: 1 via ORAL
  Filled 2023-08-18: qty 1

## 2023-08-18 MED ORDER — AMOXICILLIN-POT CLAVULANATE 875-125 MG PO TABS: 1.0000 | ORAL_TABLET | Freq: Two times a day (BID) | ORAL | 0 refills | Status: AC

## 2023-08-18 NOTE — ED Provider Notes (Signed)
 [place] EMERGENCY DEPARTMENT AT [facility] Provider Note   CSN: [id number] Arrival date & time: 08/18/23  2005     Patient presents with: Otalgia   Virginia Simmons is a 48 y.o. female.  {Add pertinent medical, surgical, social history, OB history to [id number]}  Otalgia      Prior to Admission medications   Medication Sig Start Date End Date Taking? Authorizing Provider  ACCU-CHEK GUIDE test strip E1 1.65, FOR CAPILLARY BLOOD GLUCOSE TESTING MORNING AND TWO HOURS AFTER SUPPER. 03/01/20   [provider]  Accu-Chek Softclix Lancets lancets daily. 11/04/22   [provider]  albuterol  (PROAIR  HFA) 108 (90 Base) MCG/ACT inhaler ProAir  HFA 90 mcg/actuation aerosol inhaler  INHALE 2 PUFFS 4 TIMES A DAY AS NEEDED 06/11/19   [provider]  ALPRAZolam  (XANAX ) 0.25 MG tablet Take 1 tablet by mouth 2 (two) times daily.    [provider]  ALPRAZolam  (XANAX ) 0.5 MG tablet Take 1 tablet by mouth 2 (two) times daily.    [provider]  ALPRAZolam  (XANAX ) 1 MG tablet Take 1 mg by mouth at bedtime as needed for sleep (sleep).     [provider]  ALPRAZolam  (XANAX ) 1 MG tablet Take 1 tablet by mouth 2 (two) times daily.    [provider]  amoxicillin  (AMOXIL ) 875 MG tablet Take 875 mg by mouth 2 (two) times daily. 02/06/23   [provider]  amoxicillin -clavulanate (AUGMENTIN ) 875-125 MG tablet Take 1 tablet by mouth 2 (two) times daily.    [provider]  ARIPiprazole (ABILIFY) 5 MG tablet Take 1 tablet by mouth at bedtime. 06/29/19   [provider]  atenolol -chlorthalidone (TENORETIC) 50-25 MG per tablet Take 1 tablet by mouth daily.    [provider]  atorvastatin (LIPITOR) 10 MG tablet Take 1 tablet by mouth at bedtime. 06/11/19   [provider]  azithromycin (ZITHROMAX) 250 MG tablet Take 250 mg by mouth as directed.    [provider]  benztropine  (COGENTIN ) 1  MG tablet Take 1 tablet by mouth daily.    [provider]  Calcium  Carb-Cholecalciferol 600-10 MG-MCG CAPS Take 1 capsule by mouth 2 (two) times daily.    [provider]  cefUROXime  (CEFTIN ) 250 MG tablet Take 1 tablet by mouth 2 (two) times daily.    [provider]  cephALEXin  (KEFLEX ) 500 MG capsule Take 500 mg by mouth 4 (four) times daily.    [provider]  cetirizine (ZYRTEC) 10 MG tablet Take 10 mg by mouth daily. 02/06/23   [provider]  ciprofloxacin (CIPRO) 500 MG tablet Take 1 tablet by mouth 2 (two) times daily.    [provider]  clarithromycin (BIAXIN) 500 MG tablet Take 500 mg by mouth as directed.    [provider]  cyclobenzaprine  (FLEXERIL ) 10 MG tablet Take 1 tablet (10 mg total) by mouth 2 (two) times daily as needed for muscle spasms. 07/10/20   Kelsey Patricia, MD  diclofenac Sodium (VOLTAREN) 1 % GEL Apply 2 g topically 4 (four) times daily. 10/22/22   [provider]  docusate sodium  (COLACE) 100 MG capsule Take 1 capsule (100 mg total) by mouth every 12 (twelve) hours. 11/06/20   Delray Fielding, PA-C  doxycycline (VIBRA-TABS) 100 MG tablet Take 1 tablet by mouth 2 (two) times daily.    [provider]  Dulaglutide (TRULICITY) 0.75 MG/0.5ML SOAJ Inject 0.75 mg into the skin as directed. 01/11/20   [provider]  ergocalciferol (VITAMIN D2) 1.25 MG (50000 UT) capsule ergocalciferol (vitamin D2) 1,250 mcg (50,000 unit) capsule    [provider]  fexofenadine  (ALLEGRA ) 180 MG tablet Take 1 tablet (180 mg total) by mouth daily. 10/28/11 10/27/12  Estrella Hench, MD  fexofenadine  (ALLEGRA ) 180 MG tablet Take 1 tablet by mouth daily.    [provider]  fluconazole (DIFLUCAN) 150 MG tablet Take 1 tablet by mouth once a week.    [provider]  fluticasone  (FLONASE ) 50 MCG/ACT nasal spray Place 1 spray into the nose 2 (two) times daily. 10/28/11 10/27/12  Estrella Hench, MD  fluticasone  (FLONASE ) 50 MCG/ACT nasal spray Place 1 spray into both nostrils 2 (two) times daily.    [provider]  folic acid (FOLVITE) 1 MG tablet folic acid 1 mg tablet    [provider]  folic acid (FOLVITE) 1 MG tablet Take 1 tablet by mouth daily.    [provider]  haloperidol  (HALDOL ) 5 MG tablet haloperidol  5 mg tablet  TAKE 1 TABLET (5 MG TOTAL) BY MOUTH AT BEDTIME.    [provider]  haloperidol  (HALDOL ) 5 MG tablet Take 1 tablet by mouth at bedtime.    [provider]  HYDROcodone -acetaminophen  (NORCO) 10-325 MG tablet Take 1-2 tablets by mouth every 6 (six) hours as needed. 05/05/21   [provider]  HYDROcodone -acetaminophen  (NORCO/VICODIN) 5-325 MG tablet Take 1-2 tablets by mouth every 6 (six) hours as needed. 02/05/20   [provider]  hydrocortisone  2.5 % cream hydrocortisone  2.5 % topical cream    [provider]  hydroxychloroquine (PLAQUENIL) 200 MG tablet Take 2 tablets by mouth daily. 05/13/20   [provider]  hydrOXYzine  (ATARAX /VISTARIL ) 25 MG tablet hydroxyzine  HCl 25 mg tablet  TAKE 1 CAPSULE (25 MG TOTAL) BY MOUTH 2 (TWO) TIMES DAILY.    [provider]  hydrOXYzine  (VISTARIL ) 25 MG capsule Take 25 mg by mouth as needed.    [provider]  ibuprofen  (ADVIL ) 800 MG tablet Take 1 tablet by mouth 3 (three) times daily.    [provider]  ibuprofen  (ADVIL ,MOTRIN ) 600 MG tablet TAKE 1 TABLET (600 MG TOTAL) BY MOUTH EVERY 6 (SIX) HOURS AS NEEDED FOR PAIN. 05/20/14   Poe, Deirdre C, CNM  ibuprofen  (ADVIL ,MOTRIN ) 800 MG tablet Take 1 tablet (800 mg total) by mouth 3 (three) times daily. Patient not taking: Reported on 05/05/2014 04/01/12   Estrella Hench, MD  leflunomide (ARAVA) 10 MG tablet Take 10 mg by mouth as directed.    [provider]  levofloxacin  (LEVAQUIN ) 500 MG tablet Take 1 tablet (500 mg total) by mouth daily. 05/08/14   Samtani,  Jai-Gurmukh, MD  lidocaine  (LIDODERM ) 5 % Place 1 patch onto the skin as directed.    [provider]  lisinopril (ZESTRIL) 10 MG tablet Take 10 mg by mouth daily. 03/11/23   [provider]  lisinopril (ZESTRIL) 5 MG tablet Take 5 mg by mouth daily. 02/04/20   [provider]  loxapine  (LOXITANE ) 5 MG capsule Take 5 mg by mouth at bedtime.    [provider]  medroxyPROGESTERone (DEPO-PROVERA) 150 MG/ML injection Inject 150 mg into the muscle every 3 (three) months.    [provider]  meloxicam (MOBIC) 15 MG tablet Take 15 mg by mouth daily. 02/19/20   [provider]  metaxalone  (SKELAXIN ) 800 MG tablet Take 1 tablet (800 mg total) by mouth 3 (three) times daily. 07/15/16  Abbey Hobby, MD  methotrexate (RHEUMATREX) 2.5 MG tablet Take 2.5 mg by mouth as directed.    [provider]  metroNIDAZOLE (METROGEL) 0.75 % vaginal gel Place 1 Applicatorful vaginally at bedtime.    [provider]  misoprostol (CYTOTEC) 200 MCG tablet Take 800 mcg by mouth as directed.    [provider]  omeprazole (PRILOSEC) 20 MG capsule Take 20 mg by mouth daily. 12/18/22   [provider]  ondansetron  (ZOFRAN  ODT) 4 MG disintegrating tablet Take 1 tablet (4 mg total) by mouth every 8 (eight) hours as needed for nausea or vomiting. 11/06/20   Delray Fielding, PA-C  ondansetron  (ZOFRAN -ODT) 8 MG disintegrating tablet Take 8 mg by mouth 2 (two) times daily.    [provider]  oxyCODONE -acetaminophen  (PERCOCET) 10-325 MG tablet Take 1 tablet by mouth every 6 (six) hours as needed.    [provider]  oxyCODONE -acetaminophen  (PERCOCET/ROXICET) 5-325 MG per tablet Take 1-2 tablets by mouth every 4 (four) hours as needed for severe pain. 05/05/14   Lind Repine, MD  OZEMPIC, 1 MG/DOSE, 4 MG/3ML SOPN Inject 1 mg into the skin once a week. 02/14/23   [provider]  predniSONE (DELTASONE) 10 MG tablet Take 1  tablet by mouth daily.    [provider]  predniSONE (DELTASONE) 20 MG tablet Take 20 mg by mouth as directed.    [provider]  predniSONE (DELTASONE) 5 MG tablet Take 2 tablets by mouth daily.    [provider]  promethazine-codeine (PHENERGAN WITH CODEINE) 6.25-10 MG/5ML syrup Take 5 mLs by mouth every 6 (six) hours as needed for cough.    [provider]  QUEtiapine  (SEROQUEL ) 100 MG tablet quetiapine  100 mg tablet  TAKE 1 TABLET (100 MG TOTAL) BY MOUTH AT BEDTIME.    [provider]  QUEtiapine  (SEROQUEL ) 100 MG tablet Take 1 tablet by mouth at bedtime.    [provider]  sodium chloride  (OCEAN) 0.65 % SOLN nasal spray Place 1 spray into the nose as needed for congestion. Patient not taking: Reported on 05/05/2014 12/01/12   Kabbe, Angela M, NP  sulfamethoxazole -trimethoprim  (BACTRIM  DS) 800-160 MG tablet Take 1 tablet by mouth 2 (two) times daily.    [provider]  terconazole (TERAZOL 7) 0.4 % vaginal cream Place 1 applicator vaginally at bedtime.    [provider]  traMADol  (ULTRAM ) 50 MG tablet Take 1 tablet (50 mg total) by mouth every 6 (six) hours as needed. Patient not taking: Reported on 05/05/2014 07/05/13   Corey, Evan S, MD  traMADol -acetaminophen  (ULTRACET) 37.5-325 MG tablet Take 1 tablet by mouth every 8 (eight) hours as needed.    [provider]  triamterene-hydrochlorothiazide (DYAZIDE) 37.5-25 MG capsule Take 1 capsule by mouth daily. 06/11/19   [provider]  valACYclovir  (VALTREX ) 1000 MG tablet Take 1 tablet (1,000 mg total) by mouth 3 (three) times daily. Patient not taking: Reported on 05/05/2014 07/05/13   Corey, Evan S, MD  valACYclovir  (VALTREX ) 1000 MG tablet Take 1 tablet (1,000 mg total) by mouth 3 (three) times daily. 06/19/15   Beauty Bourbon, PA    Allergies: Influenza vaccines and Pneumococcal vaccines    Review of Systems  HENT:  Positive for ear pain.     Updated  Vital Signs BP (!) 152/99   Pulse 86   Temp 98.4 F (36.9 C) (Oral)   Resp 15   Ht 5' 4 (1.626 m)   Wt 108.9 kg  LMP 07/29/2023 (Approximate)   SpO2 100%   BMI 41.20 kg/m   Physical Exam  (all labs ordered are listed, but only abnormal results are displayed) Labs Reviewed  GROUP A STREP BY PCR  RESP PANEL BY RT-PCR (RSV, FLU A&B, COVID)  [id number]    EKG: None  Radiology: No results found.  Procedures   Medications Ordered in the ED  oxyCODONE -acetaminophen  (PERCOCET/ROXICET) 5-325 MG per tablet 1 tablet (1 tablet Oral Given 08/18/23 2207)   Medical Decision Making Risk Prescription drug management.   48 y.o. female presents to the ER for evaluation of left ear pain. Differential diagnosis includes but is not limited to ***. Vital signs ***. Physical exam as noted above.   I independently reviewed and interpreted the patient's labs. ***.  After consideration of the diagnostic results and the patients response to treatment, I feel that *** .   Emergency department workup does*** not suggest an emergent condition requiring admission or immediate intervention beyond what has been performed at this time.   ***We discussed the results of the labs/imaging. The plan is ***. We discussed strict return precautions and red flag symptoms. The patient verbalized their understanding and agrees to the plan. The patient is stable and being discharged home in good condition.  ***Portions of this report may have been transcribed using voice recognition software. Every effort was made to ensure accuracy; however, inadvertent computerized transcription errors may be present.     Final diagnoses:  None    ED Discharge Orders     None

## 2023-08-18 NOTE — ED Triage Notes (Signed)
 Pt has had left earache x 2-3 weeks. Pt was prescribed ear drops with little relief.

## 2023-08-18 NOTE — Discharge Instructions (Signed)
 You were seen in the emergency department today for evaluation of your symptoms.  Your pain is likely coming from your tooth.  I am going to prescribe you a medication called Augmentin  that you will take twice daily for the next 7 days.  This should also help with your sinus symptoms.  Ultimately, you will need to see a dentist to follow-up with your symptoms.  Included information for dentist in the area.  You will need to call to schedule an appointment.  For pain, I do recommend Tylenol  and/or ibuprofen  as needed for pain.  I have included more information included in your discharge report for you to follow-up with.  If you have any concerns with new or worsening symptoms, please return to your nearest emergency department for evaluation.  Contact a dental care provider if: You have dental pain and you don't know why. Your pain isn't controlled with medicines. Your symptoms get worse. You have new symptoms. Get help right away if: You can't open your mouth. You're having trouble breathing or swallowing. You have a fever. Your face, neck, or jaw is swollen. These symptoms may be an emergency. Call 911 right away. Do not wait to see if the symptoms will go away. Do not drive yourself to the hospital.

## 2024-01-26 ENCOUNTER — Ambulatory Visit: Admission: EM | Admit: 2024-01-26 | Discharge: 2024-01-26 | Disposition: A | Discharge status: Home / Self Care

## 2024-01-26 DIAGNOSIS — B349 Viral infection, unspecified: Secondary | ICD-10-CM | POA: Diagnosis not present

## 2024-01-26 MED ORDER — PREDNISONE 20 MG PO TABS: 40.0000 mg | ORAL_TABLET | Freq: Every day | ORAL | 0 refills | Status: AC

## 2024-01-26 MED ORDER — AZELASTINE HCL 0.1 % NA SOLN: 1.0000 | Freq: Two times a day (BID) | NASAL | 1 refills | Status: AC

## 2024-01-26 MED ORDER — PROMETHAZINE-DM 6.25-15 MG/5ML PO SYRP: 10.0000 mL | ORAL_SOLUTION | Freq: Three times a day (TID) | ORAL | 0 refills | Status: AC | PRN

## 2024-01-26 NOTE — ED Provider Notes (Signed)
 [facility]  Note:  This document was prepared using Conservation officer, historic buildings and may include unintentional dictation errors.  MRN: [id number] DOB: 12-13-1975  Subjective:   Virginia Simmons is a 48 y.o. female presenting for evaluation of cold-like symptoms with sore throat, congestion, body aches, cough since Wednesday.  Patient reports that cough is usually worse at night.   patient reports she has been taking Tylenol  Cold and flu with mild improvement.  Denies any known fever.  Patient states that her daughter has similar viral symptoms with onset prior to her own symptoms.  No shortness of breath, chest pain, weakness, dizziness.  No current facility-administered medications for this encounter.  Current Outpatient Medications:    amoxicillin  (AMOXIL ) 875 MG tablet, Take 875 mg by mouth 2 (two) times daily., Disp: , Rfl:    azelastine  (ASTELIN ) 0.1 % nasal spray, Place 1 spray into both nostrils 2 (two) times daily. Use in each nostril as directed, Disp: 30 mL, Rfl: 1   Blood Glucose Monitoring Suppl (ACCU-CHEK GUIDE ME) w/Device KIT, APPLY 1 APPLICATION AS DIRECTED DAILY., Disp: , Rfl:    naloxone  (NARCAN ) nasal spray 4 mg/0.1 mL, Place 0.4 mg into the nose once., Disp: , Rfl:    predniSONE  (DELTASONE ) 20 MG tablet, Take 2 tablets (40 mg total) by mouth daily for 5 days., Disp: 10 tablet, Rfl: 0   promethazine -dextromethorphan (PROMETHAZINE -DM) 6.25-15 MG/5ML syrup, Take 10 mLs by mouth 3 (three) times daily as needed., Disp: 240 mL, Rfl: 0   sertraline (ZOLOFT) 25 MG tablet, Take 25 mg by mouth daily., Disp: , Rfl:    sertraline (ZOLOFT) 25 MG tablet, Take 25 mg by mouth daily., Disp: , Rfl:    ACCU-CHEK GUIDE test strip, E1 1.65, FOR CAPILLARY BLOOD GLUCOSE TESTING MORNING AND TWO HOURS AFTER SUPPER., Disp: , Rfl:    Accu-Chek Softclix Lancets lancets, daily., Disp: , Rfl:    albuterol  (PROAIR  HFA) 108 (90 Base) MCG/ACT inhaler, ProAir  HFA 90 mcg/actuation aerosol  inhaler  INHALE 2 PUFFS 4 TIMES A DAY AS NEEDED, Disp: , Rfl:    ALPRAZolam  (XANAX ) 0.25 MG tablet, Take 1 tablet by mouth 2 (two) times daily., Disp: , Rfl:    ALPRAZolam  (XANAX ) 0.5 MG tablet, Take 1 tablet by mouth 2 (two) times daily., Disp: , Rfl:    ALPRAZolam  (XANAX ) 1 MG tablet, Take 1 mg by mouth at bedtime as needed for sleep (sleep). , Disp: , Rfl:    ALPRAZolam  (XANAX ) 1 MG tablet, Take 1 tablet by mouth 2 (two) times daily., Disp: , Rfl:    amoxicillin -clavulanate (AUGMENTIN ) 875-125 MG tablet, Take 1 tablet by mouth every 12 (twelve) hours., Disp: 14 tablet, Rfl: 0   ARIPiprazole (ABILIFY) 5 MG tablet, Take 1 tablet by mouth at bedtime., Disp: , Rfl:    atenolol -chlorthalidone (TENORETIC) 50-25 MG per tablet, Take 1 tablet by mouth daily., Disp: , Rfl:    atorvastatin (LIPITOR) 10 MG tablet, Take 1 tablet by mouth at bedtime., Disp: , Rfl:    azithromycin (ZITHROMAX) 250 MG tablet, Take 250 mg by mouth as directed., Disp: , Rfl:    benztropine  (COGENTIN ) 1 MG tablet, Take 1 tablet by mouth daily., Disp: , Rfl:    Calcium  Carb-Cholecalciferol 600-10 MG-MCG CAPS, Take 1 capsule by mouth 2 (two) times daily., Disp: , Rfl:    cetirizine (ZYRTEC) 10 MG tablet, Take 10 mg by mouth daily., Disp: , Rfl:    cyclobenzaprine  (FLEXERIL ) 10 MG tablet, Take 1 tablet (10  mg total) by mouth 2 (two) times daily as needed for muscle spasms., Disp: 8 tablet, Rfl: 0   diclofenac Sodium (VOLTAREN) 1 % GEL, Apply 2 g topically 4 (four) times daily., Disp: , Rfl:    docusate sodium  (COLACE) 100 MG capsule, Take 1 capsule (100 mg total) by mouth every 12 (twelve) hours., Disp: 60 capsule, Rfl: 0   Dulaglutide (TRULICITY) 0.75 MG/0.5ML SOAJ, Inject 0.75 mg into the skin as directed., Disp: , Rfl:    ergocalciferol (VITAMIN D2) 1.25 MG (50000 UT) capsule, ergocalciferol (vitamin D2) 1,250 mcg (50,000 unit) capsule, Disp: , Rfl:    fexofenadine  (ALLEGRA ) 180 MG tablet, Take 1 tablet (180 mg total) by mouth  daily., Disp: 30 tablet, Rfl: 1   fexofenadine  (ALLEGRA ) 180 MG tablet, Take 1 tablet by mouth daily., Disp: , Rfl:    fluconazole (DIFLUCAN) 150 MG tablet, Take 1 tablet by mouth once a week., Disp: , Rfl:    fluticasone  (FLONASE ) 50 MCG/ACT nasal spray, Place 1 spray into the nose 2 (two) times daily., Disp: 1 g, Rfl: 2   fluticasone  (FLONASE ) 50 MCG/ACT nasal spray, Place 1 spray into both nostrils 2 (two) times daily., Disp: , Rfl:    folic acid (FOLVITE) 1 MG tablet, folic acid 1 mg tablet, Disp: , Rfl:    folic acid (FOLVITE) 1 MG tablet, Take 1 tablet by mouth daily., Disp: , Rfl:    haloperidol  (HALDOL ) 5 MG tablet, haloperidol  5 mg tablet  TAKE 1 TABLET (5 MG TOTAL) BY MOUTH AT BEDTIME., Disp: , Rfl:    haloperidol  (HALDOL ) 5 MG tablet, Take 1 tablet by mouth at bedtime., Disp: , Rfl:    HYDROcodone -acetaminophen  (NORCO) 10-325 MG tablet, Take 1-2 tablets by mouth every 6 (six) hours as needed., Disp: , Rfl:    HYDROcodone -acetaminophen  (NORCO/VICODIN) 5-325 MG tablet, Take 1-2 tablets by mouth every 6 (six) hours as needed., Disp: , Rfl:    hydrocortisone  2.5 % cream, hydrocortisone  2.5 % topical cream, Disp: , Rfl:    hydroxychloroquine (PLAQUENIL) 200 MG tablet, Take 2 tablets by mouth daily., Disp: , Rfl:    hydrOXYzine  (ATARAX /VISTARIL ) 25 MG tablet, hydroxyzine  HCl 25 mg tablet  TAKE 1 CAPSULE (25 MG TOTAL) BY MOUTH 2 (TWO) TIMES DAILY., Disp: , Rfl:    hydrOXYzine  (VISTARIL ) 25 MG capsule, Take 25 mg by mouth as needed., Disp: , Rfl:    ibuprofen  (ADVIL ) 800 MG tablet, Take 1 tablet by mouth 3 (three) times daily., Disp: , Rfl:    ibuprofen  (ADVIL ,MOTRIN ) 600 MG tablet, TAKE 1 TABLET (600 MG TOTAL) BY MOUTH EVERY 6 (SIX) HOURS AS NEEDED FOR PAIN., Disp: 30 tablet, Rfl: 1   ibuprofen  (ADVIL ,MOTRIN ) 800 MG tablet, Take 1 tablet (800 mg total) by mouth 3 (three) times daily. (Patient not taking: Reported on 05/05/2014), Disp: 30 tablet, Rfl: 0   leflunomide (ARAVA) 10 MG tablet, Take 10  mg by mouth as directed., Disp: , Rfl:    levofloxacin  (LEVAQUIN ) 500 MG tablet, Take 1 tablet (500 mg total) by mouth daily., Disp: 5 tablet, Rfl: 0   lidocaine  (LIDODERM ) 5 %, Place 1 patch onto the skin as directed., Disp: , Rfl:    lisinopril (ZESTRIL) 10 MG tablet, Take 10 mg by mouth daily., Disp: , Rfl:    lisinopril (ZESTRIL) 5 MG tablet, Take 5 mg by mouth daily., Disp: , Rfl:    loxapine  (LOXITANE ) 5 MG capsule, Take 5 mg by mouth at bedtime., Disp: , Rfl:    medroxyPROGESTERone (  DEPO-PROVERA) 150 MG/ML injection, Inject 150 mg into the muscle every 3 (three) months., Disp: , Rfl:    meloxicam (MOBIC) 15 MG tablet, Take 15 mg by mouth daily., Disp: , Rfl:    metaxalone  (SKELAXIN ) 800 MG tablet, Take 1 tablet (800 mg total) by mouth 3 (three) times daily., Disp: 21 tablet, Rfl: 0   methotrexate (RHEUMATREX) 2.5 MG tablet, Take 2.5 mg by mouth as directed., Disp: , Rfl:    metroNIDAZOLE (METROGEL) 0.75 % vaginal gel, Place 1 Applicatorful vaginally at bedtime., Disp: , Rfl:    misoprostol (CYTOTEC) 200 MCG tablet, Take 800 mcg by mouth as directed., Disp: , Rfl:    omeprazole (PRILOSEC) 20 MG capsule, Take 20 mg by mouth daily., Disp: , Rfl:    ondansetron  (ZOFRAN  ODT) 4 MG disintegrating tablet, Take 1 tablet (4 mg total) by mouth every 8 (eight) hours as needed for nausea or vomiting., Disp: 20 tablet, Rfl: 0   ondansetron  (ZOFRAN -ODT) 8 MG disintegrating tablet, Take 8 mg by mouth 2 (two) times daily., Disp: , Rfl:    oxyCODONE -acetaminophen  (PERCOCET) 10-325 MG tablet, Take 1 tablet by mouth every 6 (six) hours as needed., Disp: , Rfl:    oxyCODONE -acetaminophen  (PERCOCET/ROXICET) 5-325 MG per tablet, Take 1-2 tablets by mouth every 4 (four) hours as needed for severe pain., Disp: 10 tablet, Rfl: 0   OZEMPIC, 1 MG/DOSE, 4 MG/3ML SOPN, Inject 1 mg into the skin once a week., Disp: , Rfl:    promethazine -codeine (PHENERGAN  WITH CODEINE) 6.25-10 MG/5ML syrup, Take 5 mLs by mouth every 6  (six) hours as needed for cough., Disp: , Rfl:    QUEtiapine  (SEROQUEL ) 100 MG tablet, quetiapine  100 mg tablet  TAKE 1 TABLET (100 MG TOTAL) BY MOUTH AT BEDTIME., Disp: , Rfl:    QUEtiapine  (SEROQUEL ) 100 MG tablet, Take 1 tablet by mouth at bedtime., Disp: , Rfl:    sodium chloride  (OCEAN) 0.65 % SOLN nasal spray, Place 1 spray into the nose as needed for congestion. (Patient not taking: Reported on 05/05/2014), Disp: 1 Bottle, Rfl: 0   terconazole (TERAZOL 7) 0.4 % vaginal cream, Place 1 applicator vaginally at bedtime., Disp: , Rfl:    traMADol  (ULTRAM ) 50 MG tablet, Take 1 tablet (50 mg total) by mouth every 6 (six) hours as needed. (Patient not taking: Reported on 05/05/2014), Disp: 15 tablet, Rfl: 0   traMADol -acetaminophen  (ULTRACET) 37.5-325 MG tablet, Take 1 tablet by mouth every 8 (eight) hours as needed., Disp: , Rfl:    triamterene-hydrochlorothiazide (DYAZIDE) 37.5-25 MG capsule, Take 1 capsule by mouth daily., Disp: , Rfl:    valACYclovir  (VALTREX ) 1000 MG tablet, Take 1 tablet (1,000 mg total) by mouth 3 (three) times daily. (Patient not taking: Reported on 05/05/2014), Disp: 30 tablet, Rfl: 0   valACYclovir  (VALTREX ) 1000 MG tablet, Take 1 tablet (1,000 mg total) by mouth 3 (three) times daily., Disp: 21 tablet, Rfl: 0   Allergies  Allergen Reactions   Influenza Vaccines Rash   Pneumococcal Vaccines Hives, Shortness Of Breath and Rash    Past Medical History:  Diagnosis Date   Arthritis    Depression    Diabetes mellitus    Fibromyalgia lupus   Hypertension    Meningitis      Past Surgical History:  Procedure Laterality Date   CESAREAN SECTION  08/30/2011   Procedure: CESAREAN SECTION;  Surgeon: Debby JULIANNA Lares, MD;  Location: [facility];  Service: Gynecology;  Laterality: N/A;   CESAREAN SECTION  FOOT FRACTURE SURGERY     FOOT SURGERY     left    HAND SURGERY     HAND SURGERY     left   LAPAROSCOPIC TUBAL LIGATION N/A 05/28/2012   Procedure: LAPAROSCOPIC TUBAL  LIGATION;  Surgeon: Debby JULIANNA Lares, MD;  Location: [facility];  Service: Gynecology;  Laterality: N/A;  laparoscopic tubal cauterization    Family History  Problem Relation Age of Onset   Hypertension Father    Diabetes Father    Drug abuse Father    Bipolar disorder Sister    Breast cancer Maternal Grandmother     Social History   Tobacco Use   Smoking status: Every Day    Current packs/day: 0.25    Average packs/day: 0.3 packs/day for 10.0 years (2.5 ttl pk-yrs)    Types: Cigarettes    Passive exposure: Current   Smokeless tobacco: Never  Vaping Use   Vaping status: Never Used  Substance Use Topics   Alcohol use: No   Drug use: No    ROS Refer to HPI for ROS details.  Objective:   Vitals: BP (!) 138/96 (BP Location: Left Arm)   Pulse 86   Temp 98 F (36.7 C) (Oral)   Resp 20   Ht 5' 4 (1.626 m)   Wt 240 lb (108.9 kg)   LMP 01/17/2024 (Exact Date)   SpO2 98%   BMI 41.20 kg/m   Physical Exam Vitals and nursing note reviewed.  Constitutional:      General: She is not in acute distress.    Appearance: Normal appearance. She is well-developed. She is not ill-appearing or toxic-appearing.  HENT:     Head: Normocephalic and atraumatic.     Nose: Congestion present. No rhinorrhea.     Mouth/Throat:     Mouth: Mucous membranes are moist.  Cardiovascular:     Rate and Rhythm: Normal rate and regular rhythm.     Heart sounds: Normal heart sounds. No murmur heard. Pulmonary:     Effort: Pulmonary effort is normal. No respiratory distress.     Breath sounds: Normal breath sounds. No stridor. No wheezing, rhonchi or rales.  Chest:     Chest wall: No tenderness.  Skin:    General: Skin is warm and dry.  Neurological:     General: No focal deficit present.     Mental Status: She is alert and oriented to person, place, and time.  Psychiatric:        Mood and Affect: Mood normal.        Behavior: Behavior normal.     Procedures  No results found for this or  any previous visit (from the past 24 hours).  No results found.   Assessment and Plan :     Discharge Instructions       1. Acute viral syndrome (Primary) - predniSONE  (DELTASONE ) 20 MG tablet; Take 2 tablets (40 mg total) by mouth daily for 5 days.  Dispense: 10 tablet; Refill: 0 - promethazine -dextromethorphan (PROMETHAZINE -DM) 6.25-15 MG/5ML syrup; Take 10 mLs by mouth 3 (three) times daily as needed.  Dispense: 240 mL; Refill: 0 - azelastine  (ASTELIN ) 0.1 % nasal spray; Place 1 spray into both nostrils 2 (two) times daily. Use in each nostril as directed  Dispense: 30 mL; Refill: 1 - Continue taking Tylenol  for cold and flu for acute viral symptoms.  -Continue to monitor symptoms for any change in severity if there is any escalation of current symptoms or development of new  symptoms follow-up in ER for further evaluation and management.       Hurman Ketelsen B Ronda Kazmi   Anwar Crill, [place] B, [place] 01/26/24 [phone]

## 2024-01-26 NOTE — ED Triage Notes (Signed)
 Patient reports symptoms beginning with ha on Wednesday followed by sore throat, swollen glands in neck, with congestion/cough (productive) that continues. No fever known.

## 2024-01-26 NOTE — Discharge Instructions (Signed)
  1. Acute viral syndrome (Primary) - predniSONE  (DELTASONE ) 20 MG tablet; Take 2 tablets (40 mg total) by mouth daily for 5 days.  Dispense: 10 tablet; Refill: 0 - promethazine -dextromethorphan (PROMETHAZINE -DM) 6.25-15 MG/5ML syrup; Take 10 mLs by mouth 3 (three) times daily as needed.  Dispense: 240 mL; Refill: 0 - azelastine  (ASTELIN ) 0.1 % nasal spray; Place 1 spray into both nostrils 2 (two) times daily. Use in each nostril as directed  Dispense: 30 mL; Refill: 1 - Continue taking Tylenol  for cold and flu for acute viral symptoms.  -Continue to monitor symptoms for any change in severity if there is any escalation of current symptoms or development of new symptoms follow-up in ER for further evaluation and management.
